# Patient Record
Sex: Female | Born: 1950
Health system: Southern US, Community
[De-identification: ages and names within clinical notes are randomized; demographics above are authoritative.]

## PROBLEM LIST (undated history)

## (undated) DIAGNOSIS — E669 Obesity, unspecified: Secondary | ICD-10-CM

## (undated) DIAGNOSIS — G43909 Migraine, unspecified, not intractable, without status migrainosus: Secondary | ICD-10-CM

## (undated) DIAGNOSIS — T4145XA Adverse effect of unspecified anesthetic, initial encounter: Secondary | ICD-10-CM

## (undated) DIAGNOSIS — T8859XA Other complications of anesthesia, initial encounter: Secondary | ICD-10-CM

## (undated) DIAGNOSIS — N200 Calculus of kidney: Secondary | ICD-10-CM

## (undated) DIAGNOSIS — Z9889 Other specified postprocedural states: Secondary | ICD-10-CM

## (undated) DIAGNOSIS — E559 Vitamin D deficiency, unspecified: Secondary | ICD-10-CM

## (undated) DIAGNOSIS — G5710 Meralgia paresthetica, unspecified lower limb: Secondary | ICD-10-CM

## (undated) DIAGNOSIS — L280 Lichen simplex chronicus: Secondary | ICD-10-CM

## (undated) DIAGNOSIS — R112 Nausea with vomiting, unspecified: Secondary | ICD-10-CM

## (undated) DIAGNOSIS — E7800 Pure hypercholesterolemia, unspecified: Secondary | ICD-10-CM

## (undated) DIAGNOSIS — M8580 Other specified disorders of bone density and structure, unspecified site: Secondary | ICD-10-CM

## (undated) DIAGNOSIS — M5030 Other cervical disc degeneration, unspecified cervical region: Secondary | ICD-10-CM

## (undated) HISTORY — DX: Meralgia paresthetica, unspecified lower limb: G57.10

## (undated) HISTORY — DX: Other cervical disc degeneration, unspecified cervical region: M50.30

## (undated) HISTORY — DX: Calculus of kidney: N20.0

## (undated) HISTORY — DX: Pure hypercholesterolemia, unspecified: E78.00

## (undated) HISTORY — DX: Other specified disorders of bone density and structure, unspecified site: M85.80

## (undated) HISTORY — DX: Vitamin D deficiency, unspecified: E55.9

## (undated) HISTORY — DX: Lichen simplex chronicus: L28.0

## (undated) HISTORY — PX: LITHOTRIPSY: SUR834

## (undated) HISTORY — PX: OTHER SURGICAL HISTORY: SHX169

---

## 1988-04-08 HISTORY — PX: OTHER SURGICAL HISTORY: SHX169

## 2004-03-28 ENCOUNTER — Ambulatory Visit: Payer: Self-pay | Admitting: Family Medicine

## 2005-08-28 ENCOUNTER — Ambulatory Visit: Payer: Self-pay | Admitting: Internal Medicine

## 2006-09-02 ENCOUNTER — Ambulatory Visit: Payer: Self-pay | Admitting: Internal Medicine

## 2007-09-30 ENCOUNTER — Ambulatory Visit: Payer: Self-pay | Admitting: Internal Medicine

## 2008-10-07 ENCOUNTER — Ambulatory Visit: Payer: Self-pay | Admitting: Internal Medicine

## 2008-10-13 ENCOUNTER — Ambulatory Visit: Payer: Self-pay | Admitting: Urology

## 2008-11-17 ENCOUNTER — Ambulatory Visit: Payer: Self-pay | Admitting: Urology

## 2009-10-19 ENCOUNTER — Ambulatory Visit: Payer: Self-pay | Admitting: Internal Medicine

## 2010-11-12 ENCOUNTER — Ambulatory Visit: Payer: Self-pay | Admitting: Unknown Physician Specialty

## 2011-11-13 ENCOUNTER — Ambulatory Visit: Payer: Self-pay | Admitting: Internal Medicine

## 2012-03-19 ENCOUNTER — Telehealth: Payer: Self-pay | Admitting: Internal Medicine

## 2012-03-19 NOTE — Telephone Encounter (Signed)
Pt called wanting to get a follow up appointment with you prior to the end of the year.  Pt stated she has met her deductable this year and wanted to see you Pt wants morning appointment

## 2012-03-19 NOTE — Telephone Encounter (Signed)
Schedule an appt for 12/18 - at 9:15 (30 min)

## 2012-03-19 NOTE — Telephone Encounter (Signed)
Pt aware of appointment 

## 2012-03-25 ENCOUNTER — Telehealth: Payer: Self-pay | Admitting: Internal Medicine

## 2012-03-25 ENCOUNTER — Ambulatory Visit (INDEPENDENT_AMBULATORY_CARE_PROVIDER_SITE_OTHER): Payer: No Typology Code available for payment source | Admitting: Internal Medicine

## 2012-03-25 ENCOUNTER — Encounter: Payer: Self-pay | Admitting: Internal Medicine

## 2012-03-25 VITALS — BP 120/80 | HR 71 | Temp 98.3°F | Ht 65.0 in | Wt 249.0 lb

## 2012-03-25 DIAGNOSIS — E559 Vitamin D deficiency, unspecified: Secondary | ICD-10-CM | POA: Insufficient documentation

## 2012-03-25 DIAGNOSIS — N200 Calculus of kidney: Secondary | ICD-10-CM | POA: Insufficient documentation

## 2012-03-25 DIAGNOSIS — M8580 Other specified disorders of bone density and structure, unspecified site: Secondary | ICD-10-CM

## 2012-03-25 DIAGNOSIS — Z139 Encounter for screening, unspecified: Secondary | ICD-10-CM

## 2012-03-25 DIAGNOSIS — E7800 Pure hypercholesterolemia, unspecified: Secondary | ICD-10-CM | POA: Insufficient documentation

## 2012-03-25 DIAGNOSIS — M899 Disorder of bone, unspecified: Secondary | ICD-10-CM

## 2012-03-25 NOTE — Telephone Encounter (Signed)
Pt would like dr scott to call her when she get her labs results back from [facility] employee health

## 2012-03-26 NOTE — Telephone Encounter (Signed)
Called patient to let her know

## 2012-04-03 ENCOUNTER — Encounter: Payer: Self-pay | Admitting: Internal Medicine

## 2012-04-03 NOTE — Assessment & Plan Note (Signed)
Is s/p lithotripsy.  Has been released by urology.  Follow.    

## 2012-04-03 NOTE — Assessment & Plan Note (Signed)
Low cholesterol diet and exercise.  Follow lipid panel.   

## 2012-04-03 NOTE — Assessment & Plan Note (Signed)
Bone density 04/05/10 revealed osteopenia.  Is vitamin D deficient.  Follow.  Check vitamin D level.

## 2012-04-03 NOTE — Progress Notes (Signed)
  Subjective:    [name], female    DOB: 23-Jun-1950, 61 y.o.   MRN: [id number]  HPI 61 year old female with past history of nephrolithiasis, hypercholesterolemia and C-spine and lumbar degenerative disc disease.  She comes in today for a scheduled follow up.  She states she has been doing relatively well.  She has noticed some increased hand stiffness and discomfort at the base of her left thumb.  She is walking - one mile per day.  Saw Dr Charlsie Merles for her feet.  Also followed by chiropractor.  Still some issues with her feet.  Has a pessary in now.   Has helped.  Sees GYN.  Saw Dr Janene Harvey.  Handling stress.  No cardiac symptoms with increased activity or exertion.   Past Medical History  Diagnosis Date  . Hypercholesterolemia   . Vitamin D deficiency   . Nephrolithiasis   . Degenerative disc disease, cervical     cervical  . Meralgia paraesthetica   . Neurodermatitis   . Osteopenia     Review of Systems Patient denies any headache, lightheadedness or dizziness.  No significant sinus or allergy symptoms.  No chest pain, tightness or palpitations.  No increased shortness of breath, cough or congestion.  No nausea or vomiting.  No abdominal pain or cramping.  No bowel change, such as diarrhea, constipation, BRBPR or melana.  No urine change.  Pessary helping.  Stiffness in her hands as outlined.        Objective:   Physical Exam  Filed Vitals:   03/25/12 0927  BP: 120/80  Pulse: 71  Temp: 98.3 F (51.78 C)   61 year old female in no acute distress.   HEENT:  Nares - clear.  OP- without lesions or erythema.  NECK:  Supple, nontender.  No audible bruit.   HEART:  Appears to be regular. LUNGS:  Without crackles or wheezing audible.  Respirations even and unlabored.   RADIAL PULSE:  Equal bilaterally.  ABDOMEN:  Soft, nontender.  No audible abdominal bruit.   EXTREMITIES:  No increased edema to be present.                   Assessment & Plan:  MSK.  Hand stiffness as  outlined.  Some pain at the base of the thumb.  Discussed further evaluation.  Discussed thumb splint.  She declines any further evaluation at this time.  Follow.    PREVIOUS ELEVATED BLOOD PRESSURE.  Not elevated today.  Follow.   CARDIOVASCULAR.  Stable.  Asympomatic.  Continue risk factor modification.    PULMONARY.  Breathing stable.  Follow.    GYN.  Sees Dr Janene Harvey.  Pessary in place. Helping.   HEALTH MAINTENANCE.  Physical 10/17/11.  GU and pelvic exams are done through GYN.  Colonoscopy 8/04 was normal.  Needs referral to GI for screening colonoscopy.  Due 2014.   Mammogram 11/12/10 - BiRADS I.  States up to date with mammogram this year.  Obtain results.

## 2012-04-03 NOTE — Assessment & Plan Note (Signed)
Replace.  Follow.

## 2012-06-01 ENCOUNTER — Telehealth: Payer: Self-pay | Admitting: Internal Medicine

## 2012-06-01 NOTE — Telephone Encounter (Signed)
Patient Information:  Caller Name: Kazaria  Phone: [phone]  Patient: Nicole Hardy, Nicole Hardy  Gender: Female  DOB: [birth year]  Age: 62 Years  PCP: Dale Warren  Office Follow Up:  Does the office need to follow up with this patient?: No  Instructions For The Office: N/A  RN Note:  All triage question negative; will use homecare and call back if sx worsen  Symptoms  Reason For Call & Symptoms: Pt is calling and states that she has a cold; sx started  05/30/12; sx include cough; post nasal drip;  she is scheduled for colonoscopy on 06/11/12  and instructed that she can not take certain meds due to the procedure;  was instructed by colonoscopy personel to call MD and see if she can get an antibiotic  "to knock it out; " cough is the main sx; denies diff breathing; non prod cough; no wheezing  Reviewed Health History In EMR: Yes  Reviewed Medications In EMR: Yes  Reviewed Allergies In EMR: Yes  Reviewed Surgeries / Procedures: Yes  Date of Onset of Symptoms: 05/30/2012  Guideline(s) Used:  Colds  Cough  Disposition Per Guideline:   Home Care  Reason For Disposition Reached:   Cough with no complications  Advice Given:  Cough Medicines:  OTC Cough Syrups: The most common cough suppressant in OTC cough medications is dextromethorphan. Often the letters "DM" appear in the name.  Cough Medicines:  OTC Cough Syrups: The most common cough suppressant in OTC cough medications is dextromethorphan. Often the letters "DM" appear in the name.  OTC Cough Drops: Cough drops can help a lot, especially for mild coughs. They reduce coughing by soothing your irritated throat and removing that tickle sensation in the back of the throat. Cough drops also have the advantage of portability - you can carry them with you.  Home Remedy - Hard Candy: Hard candy works just as well as medicine-flavored OTC cough drops. Diabetics should use sugar-free candy.  Home Remedy - Honey: This old home remedy has been shown  to help decrease coughing at night. The adult dosage is 2 teaspoons (10 ml) at bedtime. Honey should not be given to infants under one year of age.  Coughing Spasms:  Drink warm fluids. Inhale warm mist (Reason: both relax the airway and loosen up the phlegm).  Prevent Dehydration:  Drink adequate liquids.  Avoid Tobacco Smoke:  Smoking or being exposed to smoke makes coughs much worse.  Call Back If:  Difficulty breathing  Fever lasts > 3 days  You become worse.

## 2012-07-23 LAB — HM COLONOSCOPY

## 2012-09-16 ENCOUNTER — Telehealth: Payer: Self-pay | Admitting: Internal Medicine

## 2012-09-16 NOTE — Telephone Encounter (Signed)
Pt has cpx on 10/22/12 pt wanted to know if you want labs prior to this appointment Pt stated dr scott faxes order to  [facility] [phone]

## 2012-09-16 NOTE — Telephone Encounter (Signed)
Please advise which labs you would like to order

## 2012-09-17 NOTE — Telephone Encounter (Signed)
Needs 25 hydroxyvitamin D, lipid profile and met c.

## 2012-09-17 NOTE — Telephone Encounter (Signed)
Prescription for labs in your box.   

## 2012-09-17 NOTE — Telephone Encounter (Signed)
Lab order faxed & pt informed

## 2012-10-12 ENCOUNTER — Telehealth: Payer: Self-pay | Admitting: *Deleted

## 2012-10-12 NOTE — Telephone Encounter (Signed)
Faxed lab orders: 25 Hydroxyvitamin D (dx. Vit. D deficiency-268.9), fasting lipid profile (dx.272.0-hypercholesterolemia), Met C (hypercholesterolemia-272.0)

## 2012-10-22 ENCOUNTER — Encounter: Payer: Self-pay | Admitting: Internal Medicine

## 2012-10-22 ENCOUNTER — Ambulatory Visit (INDEPENDENT_AMBULATORY_CARE_PROVIDER_SITE_OTHER): Payer: No Typology Code available for payment source | Admitting: Internal Medicine

## 2012-10-22 VITALS — BP 104/70 | HR 74 | Temp 97.9°F | Ht 64.5 in | Wt 247.5 lb

## 2012-10-22 DIAGNOSIS — M8580 Other specified disorders of bone density and structure, unspecified site: Secondary | ICD-10-CM

## 2012-10-22 DIAGNOSIS — E559 Vitamin D deficiency, unspecified: Secondary | ICD-10-CM

## 2012-10-22 DIAGNOSIS — E7800 Pure hypercholesterolemia, unspecified: Secondary | ICD-10-CM

## 2012-10-22 DIAGNOSIS — N200 Calculus of kidney: Secondary | ICD-10-CM

## 2012-10-22 DIAGNOSIS — M949 Disorder of cartilage, unspecified: Secondary | ICD-10-CM

## 2012-10-22 DIAGNOSIS — M899 Disorder of bone, unspecified: Secondary | ICD-10-CM

## 2012-10-22 MED ORDER — ERGOCALCIFEROL 1.25 MG (50000 UT) PO CAPS: 50000.0000 [IU] | ORAL_CAPSULE | ORAL | Status: DC

## 2012-10-24 ENCOUNTER — Encounter: Payer: Self-pay | Admitting: Internal Medicine

## 2012-10-24 NOTE — Assessment & Plan Note (Signed)
Low cholesterol diet and exercise.  Follow lipid panel.  Recent lipid panel revealed total cholesterol 172, triglycerides 131, HDL 40 and LDL 106.

## 2012-10-24 NOTE — Assessment & Plan Note (Signed)
Start ergocalciferol 50,000 units q week.  Recheck vitamin D level in 3 months.

## 2012-10-24 NOTE — Assessment & Plan Note (Signed)
Bone density 04/05/10 revealed osteopenia.  Is vitamin D deficient.  Follow.  Recent vitamin D level low.  Start replacement as outlined.

## 2012-10-24 NOTE — Assessment & Plan Note (Signed)
Is s/p lithotripsy.  Has been released by urology.  Follow.    

## 2012-10-24 NOTE — Progress Notes (Signed)
  Subjective:    [name], female    DOB: Mar 27, 1951, 61 y.o.   MRN: [id number]  HPI 62 year old female with past history of nephrolithiasis, hypercholesterolemia and C-spine and lumbar degenerative disc disease.  She comes in today to follow up on these issues a well as for a complete physical exam.  She states she has been doing relatively well.   She is walking  Still some issues with her feet.  Has recently seen Dr Ether Griffins.  Has a pessary in now.   Has helped.  Sees GYN.  Seeing Dr Nicole Hardy.  Up to date with pelvic and pap smears.  Handling stress.  No cardiac symptoms with increased activity or exertion.  Not taking any vitamin D supplements.     Past Medical History  Diagnosis Date  . Hypercholesterolemia   . Vitamin D deficiency   . Nephrolithiasis   . Degenerative disc disease, cervical     cervical  . Meralgia paraesthetica   . Neurodermatitis   . Osteopenia     Review of Systems Patient denies any headache, lightheadedness or dizziness.  No significant sinus or allergy symptoms.  No chest pain, tightness or palpitations.  No increased shortness of breath, cough or congestion.  No nausea or vomiting.  No abdominal pain or cramping.  No bowel change, such as diarrhea, constipation, BRBPR or melana.  No urine change.  Pessary helping.  Persistent problems with her feet.  Has seen podiatry.  Desires no further intervention at this time.          Objective:   Physical Exam   Filed Vitals:   10/22/12 0816  BP: 104/70  Pulse: 74  Temp: 97.9 F (56.29 C)   62 year old female in no acute distress.   HEENT:  Nares- clear.  Oropharynx - without lesions. NECK:  Supple.  Nontender.  No audible bruit.  HEART:  Appears to be regular. LUNGS:  No crackles or wheezing audible.  Respirations even and unlabored.  RADIAL PULSE:  Equal bilaterally.    BREASTS:  No nipple discharge or nipple retraction present.  Could not appreciate any distinct nodules or axillary adenopathy.   ABDOMEN:  Soft, nontender.  Bowel sounds present and normal.  No audible abdominal bruit.  GU:  Performed through gyn.     EXTREMITIES:  No increased edema present.  DP pulses palpable and equal bilaterally.          Assessment & Plan:  MSK.  Persistent foot pain.  Seeing podiatry.  Desires no further intervention at this time.  Follow.   PREVIOUS ELEVATED BLOOD PRESSURE.  Not elevated today.  Follow.   CARDIOVASCULAR.  Stable.  Asympomatic.  Continue risk factor modification.    PULMONARY.  Breathing stable.  Follow.    GYN.  Sees Dr Nicole Hardy.  Pessary in place. Helping.   HEALTH MAINTENANCE.  Physical today.  GU and pelvic exams are done through GYN.  Colonoscopy 8/04 was normal.  Needs referral to GI for screening colonoscopy.  Due 2014.   Schedule mammogram.

## 2012-10-29 ENCOUNTER — Other Ambulatory Visit: Payer: Self-pay | Admitting: *Deleted

## 2012-11-18 ENCOUNTER — Ambulatory Visit: Payer: Self-pay | Admitting: Internal Medicine

## 2012-11-26 ENCOUNTER — Encounter: Payer: Self-pay | Admitting: Internal Medicine

## 2012-12-25 ENCOUNTER — Encounter: Payer: Self-pay | Admitting: Adult Health

## 2012-12-25 ENCOUNTER — Ambulatory Visit (INDEPENDENT_AMBULATORY_CARE_PROVIDER_SITE_OTHER): Payer: No Typology Code available for payment source | Admitting: Adult Health

## 2012-12-25 VITALS — BP 128/84 | HR 82 | Temp 98.1°F | Resp 12 | Ht 64.5 in | Wt 249.0 lb

## 2012-12-25 DIAGNOSIS — B370 Candidal stomatitis: Secondary | ICD-10-CM

## 2012-12-25 MED ORDER — NYSTATIN 100000 UNIT/ML MT SUSP: 500000.0000 [IU] | Freq: Four times a day (QID) | OROMUCOSAL | Status: DC

## 2012-12-25 NOTE — Assessment & Plan Note (Signed)
Nystatin swish and swallow. RTC if symptoms not improved within 4-5 days.

## 2012-12-25 NOTE — Patient Instructions (Addendum)
  Start Nystatin swish and swallow 4 times a day until completed.  Please let us know if your symptoms are not improved within 4-5 days.  Try [place] yogurt which helps with normal bacteria and maintaining a balance.

## 2012-12-25 NOTE — Progress Notes (Signed)
  Subjective:    [name], female    DOB: [birth year], 62 y.o.   MRN: [id number]  HPI  Patient presents to clinic with concerns of discoloration of her tongue. She was recently started on an antibiotic and developed the symptoms shortly after.  Current Outpatient Prescriptions on File Prior to Visit  Medication Sig Dispense Refill  . ergocalciferol (VITAMIN D2) 50000 UNITS capsule Take 1 capsule (50,000 Units total) by mouth once a week.  4 capsule  3   No current facility-administered medications on file prior to visit.    Review of Systems  Constitutional: Negative.   HENT:       Yellow coating on tongue after finishing antibiotic    BP 128/84  Pulse 82  Temp(Src) 98.1 F (36.7 C) (Oral)  Resp 12  Ht 5' 4.5" (1.638 m)  Wt 249 lb (112.946 kg)  BMI 42.1 kg/m2  SpO2 98%  LMP 03/26/2003     Objective:   Physical Exam  HENT:  Thrush after completing antibiotic      Assessment & Plan:

## 2012-12-29 ENCOUNTER — Telehealth: Payer: Self-pay | Admitting: Adult Health

## 2012-12-29 NOTE — Telephone Encounter (Signed)
LVM for patient to call our office  

## 2012-12-29 NOTE — Telephone Encounter (Signed)
Try gently brushing your tongue with a solution that is 1 part hydrogen peroxide and 5 parts water. Rinse your mouth with water afterward. Increasing fiber in your diet also may help by decreasing the bacteria in your mouth that cause yellow tongue. Also recommend a probiotic and/or yogurt.

## 2012-12-29 NOTE — Telephone Encounter (Signed)
Patient was told to call back to let you know how she is doing with the medication for thrush. She states it has improved but she still has a yellow discoloration on her tongue. Wondering if this will continue to improve or does she need another round of meds?

## 2013-01-01 NOTE — Telephone Encounter (Signed)
Pt aware.

## 2013-01-11 ENCOUNTER — Telehealth: Payer: Self-pay | Admitting: Internal Medicine

## 2013-01-11 NOTE — Telephone Encounter (Signed)
Pt state she was seen by Raquel for mouth thrush.  States she was given nystatin and also told to use 1 part peroxide to 5 parts water, as well as eat yogurt and increase fiber in diet.  Pt states she has done all of these things and still has problems.  Says she started nystatin on 9/19, started peroxide treatments twice a day on 9/24.  States after first round of nystatin she though it was improving but now is about just as bad as it was in the beginning.  Please let her know next step.

## 2013-01-11 NOTE — Telephone Encounter (Signed)
Pt states that she can not come at that time, needs another day & needs a morning appt.

## 2013-01-11 NOTE — Telephone Encounter (Signed)
I can see her at 11:45 on 01/14/13.  This week this is the only am appt I have.  Please call her and see if she can come in then.  Thanks.

## 2013-01-11 NOTE — Telephone Encounter (Signed)
If persistent symptoms, I can see her at 2:00 tomorrow.  Work in for this.

## 2013-01-13 NOTE — Telephone Encounter (Signed)
Appointment made pt aware

## 2013-01-14 ENCOUNTER — Ambulatory Visit (INDEPENDENT_AMBULATORY_CARE_PROVIDER_SITE_OTHER): Payer: No Typology Code available for payment source | Admitting: Internal Medicine

## 2013-01-14 ENCOUNTER — Encounter: Payer: Self-pay | Admitting: Internal Medicine

## 2013-01-14 VITALS — BP 110/70 | HR 72 | Temp 97.9°F | Ht 64.5 in | Wt 252.2 lb

## 2013-01-14 DIAGNOSIS — E7800 Pure hypercholesterolemia, unspecified: Secondary | ICD-10-CM

## 2013-01-14 DIAGNOSIS — E559 Vitamin D deficiency, unspecified: Secondary | ICD-10-CM

## 2013-01-14 DIAGNOSIS — B370 Candidal stomatitis: Secondary | ICD-10-CM

## 2013-01-17 ENCOUNTER — Encounter: Payer: Self-pay | Admitting: Internal Medicine

## 2013-01-17 NOTE — Assessment & Plan Note (Signed)
Low cholesterol diet and exercise.  Follow lipid panel.  Recent lipid panel revealed total cholesterol 172, triglycerides 131, HDL 40 and LDL 106.

## 2013-01-17 NOTE — Progress Notes (Signed)
  Subjective:    [name], female    DOB: Dec 11, 1950, 62 y.o.   MRN: [id number]  HPI 62 year old female with past history of nephrolithiasis, hypercholesterolemia and C-spine and lumbar degenerative disc disease.  She comes in today as a work in with concerns regarding some persistent thrush.  She states she had been doing relatively well.   Was placed on an abx on 12/11/12.  Two days after completing the abx, she developed a coating on her tongue.  Saw Raquel.  Was given a few days of nystatin suspension.  Reports improvement, but did not completely resolve.  She has been eating yogurt.  Rinsing with peroxide and water and using listarene rinses.  She has also been gargling with salt water.  Better, but still with persistent coating on her tongue.     Past Medical History  Diagnosis Date  . Hypercholesterolemia   . Vitamin D deficiency   . Nephrolithiasis   . Degenerative disc disease, cervical     cervical  . Meralgia paraesthetica   . Neurodermatitis   . Osteopenia     Current Outpatient Prescriptions on File Prior to Visit  Medication Sig Dispense Refill  . ergocalciferol (VITAMIN D2) 50000 UNITS capsule Take 1 capsule (50,000 Units total) by mouth once a week.  4 capsule  3   No current facility-administered medications on file prior to visit.    Review of Systems Patient denies any headache, lightheadedness or dizziness.  No significant sinus or allergy symptoms.  No chest pain, tightness or palpitations.  No increased shortness of breath, cough or congestion.  No nausea or vomiting.  No acid reflux.              Objective:   Physical Exam   Filed Vitals:   01/14/13 1152  BP: 110/70  Pulse: 72  Temp: 97.9 F (60.19 C)   63 year old female in no acute distress.   HEENT:  Nares- clear.  Oropharynx - some white coating posterior tongue.  No other lesions noted.  NECK:  Supple.  Nontender.  No audible bruit.  HEART:  Appears to be regular. LUNGS:  No crackles  or wheezing audible.  Respirations even and unlabored.  ABDOMEN:  Soft, nontender.  Bowel sounds present and normal.  No audible abdominal bruit.       Assessment & Plan:  MSK.  Persistent foot pain.  Seeing podiatry.  Desires no further intervention at this time.  Follow.   PREVIOUS ELEVATED BLOOD PRESSURE.  Not elevated today.  Follow.   CARDIOVASCULAR.  Stable.  Asympomatic.  Continue risk factor modification.    PULMONARY.  Breathing stable.  Follow.    GYN.  Sees Dr Janene Harvey.  Pessary in place. Helping.   HEALTH MAINTENANCE.  Physical last visit.   GU and pelvic exams are done through GYN.  Colonoscopy 8/04 was normal.  Needs referral to GI for screening colonoscopy.  Due 2014.   Mammogram 11/18/12 - Birads II.

## 2013-01-17 NOTE — Assessment & Plan Note (Signed)
Persistent.  Only used the nystatin for a few days.  Refilled nystatin suspension.   Call if persistent.

## 2013-01-17 NOTE — Assessment & Plan Note (Signed)
On ergocalciferol 50,000 units q week.  Recheck vitamin D level.

## 2013-02-15 ENCOUNTER — Other Ambulatory Visit: Payer: Self-pay | Admitting: Internal Medicine

## 2013-03-19 ENCOUNTER — Other Ambulatory Visit: Payer: Self-pay | Admitting: Internal Medicine

## 2013-04-15 ENCOUNTER — Telehealth: Payer: Self-pay | Admitting: Internal Medicine

## 2013-04-15 ENCOUNTER — Ambulatory Visit: Payer: Self-pay | Admitting: General Practice

## 2013-04-15 ENCOUNTER — Telehealth: Payer: Self-pay | Admitting: Emergency Medicine

## 2013-04-15 NOTE — Telephone Encounter (Signed)
Pt LVM asking to be referred to [facility] due to some issues with her leg. Please advise

## 2013-04-15 NOTE — Telephone Encounter (Signed)
error 

## 2013-04-15 NOTE — Telephone Encounter (Signed)
Left voicemail on pt home phone to let her know that she can discuss her leg issues & talk about referral at her upcoming appt on 04/19/13 with Dr. Lorin PicketScott

## 2013-04-19 ENCOUNTER — Ambulatory Visit (INDEPENDENT_AMBULATORY_CARE_PROVIDER_SITE_OTHER): Payer: No Typology Code available for payment source | Admitting: Internal Medicine

## 2013-04-19 ENCOUNTER — Encounter: Payer: Self-pay | Admitting: Internal Medicine

## 2013-04-19 ENCOUNTER — Encounter (INDEPENDENT_AMBULATORY_CARE_PROVIDER_SITE_OTHER): Payer: Self-pay

## 2013-04-19 VITALS — BP 110/70 | HR 80 | Temp 98.2°F | Ht 64.5 in | Wt 250.2 lb

## 2013-04-19 DIAGNOSIS — M949 Disorder of cartilage, unspecified: Secondary | ICD-10-CM

## 2013-04-19 DIAGNOSIS — M8580 Other specified disorders of bone density and structure, unspecified site: Secondary | ICD-10-CM

## 2013-04-19 DIAGNOSIS — E7800 Pure hypercholesterolemia, unspecified: Secondary | ICD-10-CM

## 2013-04-19 DIAGNOSIS — M899 Disorder of bone, unspecified: Secondary | ICD-10-CM

## 2013-04-19 DIAGNOSIS — N200 Calculus of kidney: Secondary | ICD-10-CM

## 2013-04-19 DIAGNOSIS — E559 Vitamin D deficiency, unspecified: Secondary | ICD-10-CM

## 2013-04-19 DIAGNOSIS — I809 Phlebitis and thrombophlebitis of unspecified site: Secondary | ICD-10-CM

## 2013-04-19 MED ORDER — CLOTRIMAZOLE-BETAMETHASONE 1-0.05 % EX CREA: 1.0000 "application " | TOPICAL_CREAM | Freq: Two times a day (BID) | CUTANEOUS | Status: DC

## 2013-04-19 NOTE — Assessment & Plan Note (Addendum)
On vitamin D2 1.25.   Recheck vitamin D level.

## 2013-04-19 NOTE — Assessment & Plan Note (Addendum)
Bone density 04/05/10 revealed osteopenia.  Is vitamin D deficient.  Follow.  Recent vitamin D level low.  Continue vitamin D supplements.  Recheck vitamin D level.

## 2013-04-19 NOTE — Progress Notes (Signed)
  Subjective:    [name], female    DOB: 08/19/1950, 63 y.o.   MRN: [id number]  HPI 63 year old female with past history of nephrolithiasis, hypercholesterolemia and C-spine and lumbar degenerative disc disease.  She comes in today for a scheduled follow up.  She started having left leg pain over one week ago.  Went to acute care.  Was diagnosed with superficial thrombophlebitis.  Saw [facility] for leg swelling and pain.  Had ultrasound.  Negative DVT.  Was instructed to take Alleve and Mobic.  Has follow up in two weeks.  She also describes some left hip pain.  Has a pessary in place (due to prolapse).  [facility].  Breathing stable.  No nausea or vomiting.  No bowel change.     Past Medical History  Diagnosis Date  . Hypercholesterolemia   . Vitamin D deficiency   . Nephrolithiasis   . Degenerative disc disease, cervical     cervical  . Meralgia paraesthetica   . Neurodermatitis   . Osteopenia     Current Outpatient Prescriptions on File Prior to Visit  Medication Sig Dispense Refill  . Vitamin D, Ergocalciferol, (DRISDOL) 50000 UNITS CAPS capsule TAKE ONE CAPSULE BY MOUTH ONCE A WEEK  4 capsule  3   No current facility-administered medications on file prior to visit.    Review of Systems Patient denies any headache, lightheadedness or dizziness.  No significant sinus or allergy symptoms.  No chest pain, tightness or palpitations.  No increased shortness of breath, cough or congestion.  No nausea or vomiting.  No acid reflux.  No abdominal pain or cramping.  Pessary in place.  Leg and hip pain as outlined.  Pessary in place.  Followed by gyn.              Objective:   Physical Exam  Filed Vitals:   04/19/13 0855  BP: 110/70  Pulse: 80  Temp: 98.2 F (3636.378 C)   63 year old female in no acute distress.   HEENT:  Nares- clear.  Oropharynx - without lesions. NECK:  Supple.  Nontender.  No audible bruit.  HEART:  Appears to be regular. LUNGS:  No  crackles or wheezing audible.  Respirations even and unlabored.  RADIAL PULSE:  Equal bilaterally.    ABDOMEN:  Soft, nontender.  Bowel sounds present and normal.  No audible abdominal bruit.   EXTREMITIES:  No increased edema present.  DP pulses palpable and equal bilaterally.          Assessment & Plan:  MSK.  Following with vascular surgery for his leg.  Stretches.  Follow hip.    PREVIOUS ELEVATED BLOOD PRESSURE.  Not elevated today.  Follow.   CARDIOVASCULAR.  Stable.  Asympomatic.  Continue risk factor modification.    PULMONARY.  Breathing stable.  Follow.    GYN.  Sees Dr Janene HarveyKlett.  Pessary in place. Helping.  [facility].    HEALTH MAINTENANCE.  Physical 10/22/12.   GU and pelvic exams are done through GYN.  Colonoscopy 8/04 was normal.  Needs referral to GI for screening colonoscopy.  Was due 2014.   Needs referral.  Mammogram 11/18/12 - Birads II.

## 2013-04-19 NOTE — Progress Notes (Signed)
Pre-visit discussion using our clinic review tool. No additional management support is needed unless otherwise documented below in the visit note.  

## 2013-04-19 NOTE — Assessment & Plan Note (Addendum)
Low cholesterol diet and exercise.  Follow lipid panel.  Last lipid panel revealed total cholesterol 172, triglycerides 131, HDL 40 and LDL 106.  Follow.

## 2013-04-19 NOTE — Assessment & Plan Note (Addendum)
Is s/p lithotripsy.  Has been released by urology.  Follow.

## 2013-04-20 ENCOUNTER — Encounter: Payer: Self-pay | Admitting: Internal Medicine

## 2013-04-21 ENCOUNTER — Encounter: Payer: Self-pay | Admitting: Internal Medicine

## 2013-04-21 DIAGNOSIS — I809 Phlebitis and thrombophlebitis of unspecified site: Secondary | ICD-10-CM | POA: Insufficient documentation

## 2013-04-21 NOTE — Assessment & Plan Note (Signed)
Recently diagnosed with thrombophlebitis.  Being followed by [facility].

## 2013-08-17 ENCOUNTER — Encounter (INDEPENDENT_AMBULATORY_CARE_PROVIDER_SITE_OTHER): Payer: Self-pay

## 2013-08-17 ENCOUNTER — Telehealth: Payer: Self-pay | Admitting: Internal Medicine

## 2013-08-17 ENCOUNTER — Ambulatory Visit (INDEPENDENT_AMBULATORY_CARE_PROVIDER_SITE_OTHER): Payer: No Typology Code available for payment source | Admitting: Internal Medicine

## 2013-08-17 ENCOUNTER — Ambulatory Visit: Payer: Self-pay | Admitting: Internal Medicine

## 2013-08-17 ENCOUNTER — Encounter: Payer: Self-pay | Admitting: Internal Medicine

## 2013-08-17 VITALS — BP 118/84 | HR 87 | Temp 98.2°F | Wt 248.8 lb

## 2013-08-17 DIAGNOSIS — J209 Acute bronchitis, unspecified: Secondary | ICD-10-CM

## 2013-08-17 MED ORDER — HYDROCOD POLST-CHLORPHEN POLST 10-8 MG/5ML PO LQCR: 5.0000 mL | Freq: Two times a day (BID) | ORAL | Status: DC | PRN

## 2013-08-17 MED ORDER — AMOXICILLIN-POT CLAVULANATE 875-125 MG PO TABS: 1.0000 | ORAL_TABLET | Freq: Two times a day (BID) | ORAL | Status: DC

## 2013-08-17 NOTE — Telephone Encounter (Signed)
CXR showed no acute process. I would recommend that pt continue the Augmentin, as prescribed and follow up for recheck in 1 week.

## 2013-08-17 NOTE — Assessment & Plan Note (Signed)
Symptoms and exam most consistent with bronchitis, likely triggered by allergic rhinitis. However, given some focal rhonchi on left lower lung exam and persistence of symptoms >3 months, will get CXR today. Will start Augmentin. Use Tussionex prn cough. Continue Claritin. Follow up for recheck next week.

## 2013-08-17 NOTE — Progress Notes (Signed)
Subjective:    [name], female    DOB: 09/14/1950, 63 y.o.   MRN: [id number]  HPI 63YO female presents for acute visit with sore throat.  Initially developed cough and hoarseness in mid March. Was seen at employee clinic in April and treated with Claritin. Had some cough off and on since that time. Was generally doing well until this Sunday, developed sore throat and swollen glands in neck. Also had cough with thick green mucous. Last night had some cough with clear sputum. Fever 71F. Took Tylenol with some improvement. No dyspnea or chest pain. Throat occasionally feels sore. No trouble swallowing.  Review of Systems  Constitutional: Positive for fever and fatigue. Negative for chills, appetite change and unexpected weight change.  HENT: Positive for congestion, sore throat and voice change. Negative for ear pain, sinus pressure and trouble swallowing.   Eyes: Negative for visual disturbance.  Respiratory: Positive for cough. Negative for shortness of breath, wheezing and stridor.   Cardiovascular: Negative for chest pain, palpitations and leg swelling.  Gastrointestinal: Negative for nausea, vomiting, abdominal pain, diarrhea, constipation, blood in stool, abdominal distention and anal bleeding.  Genitourinary: Negative for dysuria and flank pain.  Musculoskeletal: Negative for arthralgias, gait problem, myalgias and neck pain.  Skin: Negative for color change and rash.  Neurological: Negative for dizziness and headaches.  Hematological: Negative for adenopathy. Does not bruise/bleed easily.  Psychiatric/Behavioral: Negative for suicidal ideas, sleep disturbance and dysphoric mood. The patient is not nervous/anxious.        Objective:    BP 118/84  Pulse 87  Temp(Src) 98.2 F (36.8 C) (Oral)  Wt 248 lb 12 oz (112.832 kg)  SpO2 96%  LMP 03/26/2003 Physical Exam  Constitutional: She is oriented to person, place, and time. She appears well-developed and  well-nourished. No distress.  HENT:  Head: Normocephalic and atraumatic.  Right Ear: External ear normal.  Left Ear: External ear normal.  Nose: Nose normal.  Mouth/Throat: Oropharynx is clear and moist. No oropharyngeal exudate.  Eyes: Conjunctivae are normal. Pupils are equal, round, and reactive to light. Right eye exhibits no discharge. Left eye exhibits no discharge. No scleral icterus.  Neck: Normal range of motion. Neck supple. No tracheal deviation present. No thyromegaly present.  Cardiovascular: Normal rate, regular rhythm, normal heart sounds and intact distal pulses.  Exam reveals no gallop and no friction rub.   No murmur heard. Pulmonary/Chest: Effort normal. No accessory muscle usage. Not tachypneic. No respiratory distress. She has no decreased breath sounds. She has no wheezes. She has rhonchi in the left lower field. She has no rales. She exhibits no tenderness.  Musculoskeletal: Normal range of motion. She exhibits no edema and no tenderness.  Lymphadenopathy:    She has no cervical adenopathy.  Neurological: She is alert and oriented to person, place, and time. No cranial nerve deficit. She exhibits normal muscle tone. Coordination normal.  Skin: Skin is warm and dry. No rash noted. She is not diaphoretic. No erythema. No pallor.  Psychiatric: She has a normal mood and affect. Her behavior is normal. Judgment and thought content normal.          Assessment & Plan:   Problem List Items Addressed This Visit   Acute bronchitis - Primary     Symptoms and exam most consistent with bronchitis, likely triggered by allergic rhinitis. However, given some focal rhonchi on left lower lung exam and persistence of symptoms >3 months, will get CXR today. Will start Augmentin.  Use Tussionex prn cough. Continue Claritin. Follow up for recheck next week.    Relevant Medications      AMOXICILLIN-POT CLAVULANATE 875-125 MG PO TABS      chlorpheniramine-HYDROcodone (TUSSIONEX)  suspension 8-10 mg/645mL   Other Relevant Orders      DG Chest 2 View       Return in about 1 week (around 08/24/2013) for Recheck.

## 2013-08-17 NOTE — Patient Instructions (Addendum)
[facility] today [facility].  Start Augmentin to treat infection and start Tussionex as needed for cough at night.  Follow up next week.

## 2013-08-17 NOTE — Telephone Encounter (Signed)
Left message, notifying of results and need to continue Augmentin and follow up in one week.

## 2013-08-17 NOTE — Progress Notes (Signed)
Pre visit review using our clinic review tool, if applicable. No additional management support is needed unless otherwise documented below in the visit note. 

## 2013-08-27 ENCOUNTER — Ambulatory Visit (INDEPENDENT_AMBULATORY_CARE_PROVIDER_SITE_OTHER): Payer: No Typology Code available for payment source | Admitting: Internal Medicine

## 2013-08-27 ENCOUNTER — Encounter: Payer: Self-pay | Admitting: Internal Medicine

## 2013-08-27 VITALS — BP 110/70 | HR 71 | Temp 97.9°F | Ht 64.5 in | Wt 250.5 lb

## 2013-08-27 DIAGNOSIS — N899 Noninflammatory disorder of vagina, unspecified: Secondary | ICD-10-CM

## 2013-08-27 DIAGNOSIS — J209 Acute bronchitis, unspecified: Secondary | ICD-10-CM

## 2013-08-27 DIAGNOSIS — N898 Other specified noninflammatory disorders of vagina: Secondary | ICD-10-CM

## 2013-08-27 MED ORDER — NYSTATIN 100000 UNIT/GM EX CREA: 1.0000 "application " | TOPICAL_CREAM | Freq: Two times a day (BID) | CUTANEOUS | Status: DC

## 2013-08-27 NOTE — Progress Notes (Signed)
Pre visit review using our clinic review tool, if applicable. No additional management support is needed unless otherwise documented below in the visit note. 

## 2013-08-27 NOTE — Patient Instructions (Signed)
Monistat - apply to vagina twice a day.

## 2013-08-30 ENCOUNTER — Encounter: Payer: Self-pay | Admitting: Internal Medicine

## 2013-08-30 NOTE — Assessment & Plan Note (Signed)
No increased cough or congestion now.  Breathing stable.  Feels better.  Follow.

## 2013-08-30 NOTE — Progress Notes (Signed)
  Subjective:    [name], female    DOB: [birth year], 63 y.o.   MRN: [id number]  HPI 63 year old female with past history of nephrolithiasis, hypercholesterolemia and C-spine and lumbar degenerative disc disease.  She comes in today for a scheduled follow up.  She was seen last week by Dr Dan Humphreys.  Diagnosed with bronchitis.  Treated with augmentin.  States she feels better.  No cough or congestion.   Breathing stable.  No sob.   No nausea or vomiting.  No bowel change.  She does report some vaginal irritation.  We discussed using nystatin cream or monistat.  Describes the irritation - external.  No vaginal discharge.    Past Medical History  Diagnosis Date  . Hypercholesterolemia   . Vitamin D deficiency   . Nephrolithiasis   . Degenerative disc disease, cervical     cervical  . Meralgia paraesthetica   . Neurodermatitis   . Osteopenia     Current Outpatient Prescriptions on File Prior to Visit  Medication Sig Dispense Refill  . amoxicillin-clavulanate (AUGMENTIN) 875-125 MG per tablet Take 1 tablet by mouth 2 (two) times daily.  20 tablet  0  . loratadine (CLARITIN) 10 MG tablet Take 10 mg by mouth daily.      . Vitamin D, Ergocalciferol, (DRISDOL) 50000 UNITS CAPS capsule TAKE ONE CAPSULE BY MOUTH ONCE A WEEK  4 capsule  3   No current facility-administered medications on file prior to visit.    Review of Systems Patient denies any headache, lightheadedness or dizziness.  No significant sinus or allergy symptoms.  No chest pain, tightness or palpitations.  No increased shortness of breath, cough or congestion.  No nausea or vomiting.  No acid reflux.  No abdominal pain or cramping.  Some external vaginal irritation.  No vaginal discharge.               Objective:   Physical Exam  Filed Vitals:   08/27/13 1052  BP: 110/70  Pulse: 71  Temp: 97.9 F (56.78 C)   63 year old female in no acute distress.   HEENT:  Nares- clear.  Oropharynx - without lesions. NECK:   Supple.  Nontender.  No audible bruit.  HEART:  Appears to be regular. LUNGS:  No crackles or wheezing audible.  Respirations even and unlabored.  RADIAL PULSE:  Equal bilaterally.    ABDOMEN:  Soft, nontender.  Bowel sounds present and normal.  No audible abdominal bruit.   EXTREMITIES:  No increased edema present.  DP pulses palpable and equal bilaterally.          Assessment & Plan:  PREVIOUS ELEVATED BLOOD PRESSURE.  Not elevated today.  Follow.   CARDIOVASCULAR.  Stable.  Asympomatic.  Continue risk factor modification.    PULMONARY.  Breathing stable.  Follow.    GYN.  Sees Dr Janene Harvey.  Pessary in place. Helping.  Referred to [facility].    HEALTH MAINTENANCE.  Physical 10/22/12.   GU and pelvic exams are done through GYN.  Colonoscopy 8/04 was normal.  Needs referral to GI for screening colonoscopy.  Was due 2014.   Needs referral.  Mammogram 11/18/12 - Birads II.

## 2013-08-30 NOTE — Assessment & Plan Note (Signed)
External irritation.   Can use monistat or nystatin as directed.  Follow.

## 2013-09-03 ENCOUNTER — Encounter: Payer: Self-pay | Admitting: Internal Medicine

## 2013-09-22 ENCOUNTER — Other Ambulatory Visit: Payer: Self-pay | Admitting: Internal Medicine

## 2013-09-30 ENCOUNTER — Telehealth: Payer: Self-pay | Admitting: *Deleted

## 2013-09-30 NOTE — Telephone Encounter (Signed)
Prescription for labs in your box.

## 2013-09-30 NOTE — Telephone Encounter (Signed)
[facility]

## 2013-09-30 NOTE — Telephone Encounter (Signed)
Pt called [facility] to her CPE appoint with you.  Please advise

## 2013-10-21 ENCOUNTER — Other Ambulatory Visit: Payer: Self-pay | Admitting: Internal Medicine

## 2013-10-21 LAB — HEPATIC FUNCTION PANEL
ALT: 12 U/L (ref 7–35)
AST: 15 U/L (ref 13–35)
Alkaline Phosphatase: 73 U/L (ref 25–125)
Bilirubin, Total: 0.4 mg/dL

## 2013-10-21 LAB — CBC AND DIFFERENTIAL
HCT: 40 % (ref 36–46)
Hemoglobin: 13 g/dL (ref 12.0–16.0)
Neutrophils Absolute: 3 /uL
Platelets: 334 10*3/uL (ref 150–399)
WBC: 5.9 10^3/mL

## 2013-10-21 LAB — BASIC METABOLIC PANEL
BUN: 11 mg/dL (ref 4–21)
Creatinine: 0.8 mg/dL (ref 0.5–1.1)
Glucose: 96 mg/dL

## 2013-10-21 LAB — TSH: TSH: 2.77 u[IU]/mL (ref 0.41–5.90)

## 2013-10-21 LAB — LIPID PANEL
Cholesterol: 201 mg/dL — AB (ref 0–200)
HDL: 45 mg/dL (ref 35–70)
LDL Cholesterol: 142 mg/dL
LDl/HDL Ratio: 4.5
Triglycerides: 71 mg/dL (ref 40–160)

## 2013-10-25 ENCOUNTER — Ambulatory Visit (INDEPENDENT_AMBULATORY_CARE_PROVIDER_SITE_OTHER): Payer: No Typology Code available for payment source | Admitting: Internal Medicine

## 2013-10-25 ENCOUNTER — Encounter: Payer: Self-pay | Admitting: Internal Medicine

## 2013-10-25 VITALS — BP 120/82 | HR 81 | Temp 98.2°F | Ht 65.0 in | Wt 251.0 lb

## 2013-10-25 DIAGNOSIS — I809 Phlebitis and thrombophlebitis of unspecified site: Secondary | ICD-10-CM

## 2013-10-25 DIAGNOSIS — Z0000 Encounter for general adult medical examination without abnormal findings: Secondary | ICD-10-CM

## 2013-10-25 DIAGNOSIS — E7800 Pure hypercholesterolemia, unspecified: Secondary | ICD-10-CM

## 2013-10-25 DIAGNOSIS — E559 Vitamin D deficiency, unspecified: Secondary | ICD-10-CM

## 2013-10-25 DIAGNOSIS — M8580 Other specified disorders of bone density and structure, unspecified site: Secondary | ICD-10-CM

## 2013-10-25 DIAGNOSIS — N200 Calculus of kidney: Secondary | ICD-10-CM

## 2013-10-25 DIAGNOSIS — Z1239 Encounter for other screening for malignant neoplasm of breast: Secondary | ICD-10-CM

## 2013-10-25 MED ORDER — VITAMIN D (ERGOCALCIFEROL) 1.25 MG (50000 UNIT) PO CAPS: ORAL_CAPSULE | ORAL | Status: DC

## 2013-10-25 NOTE — Progress Notes (Signed)
Subjective:    [name], female    DOB: 09/21/1950, 63 y.o.   MRN: [id number]  HPI 63 year old female with past history of nephrolithiasis, hypercholesterolemia and C-spine and lumbar degenerative disc disease.  She comes in today to follow up on these issues as well as for a complete physical exam.  States overall she feels she is doing well.  No cough or congestion.   Breathing stable.  No sob.   No nausea or vomiting.  No bowel change.   She has some back discomfort at times.  Not severe.  Occurs if she is still for a period of time.  Better if she gets up and moves around.  Still has issues with her feet.  Limits her weight bearing.  Unable to exercise.  We discussed water aerobics.  Again, overall she feels she is doing well.     Past Medical History  Diagnosis Date  . Hypercholesterolemia   . Vitamin D deficiency   . Nephrolithiasis   . Degenerative disc disease, cervical     cervical  . Meralgia paraesthetica   . Neurodermatitis   . Osteopenia     Current Outpatient Prescriptions on File Prior to Visit  Medication Sig Dispense Refill  . nystatin cream (MYCOSTATIN) Apply 1 application topically 2 (two) times daily.  30 g  1  . Vitamin D, Ergocalciferol, (DRISDOL) 50000 UNITS CAPS capsule TAKE ONE CAPSULE BY MOUTH ONCE A WEEK  4 capsule  3   No current facility-administered medications on file prior to visit.    Review of Systems Patient denies any headache, lightheadedness or dizziness.  No significant sinus or allergy symptoms.  No chest pain, tightness or palpitations.  No increased shortness of breath, cough or congestion.  No nausea or vomiting.  No acid reflux.  No abdominal pain or cramping.  Some back discomfort as outlined.  Minimal.  Better with movement.  No radicular symptoms.  Taking her vitamin D regularly - once a week.  Handling stress well.  Taking care of her mother.               Objective:   Physical Exam  Filed Vitals:   10/25/13 0832  BP:  120/82  Pulse: 81  Temp: 98.2 F (36.8 C)   Blood pressure recheck:  46128/5180  63 year old female in no acute distress.   HEENT:  Nares- clear.  Oropharynx - without lesions. NECK:  Supple.  Nontender.  No audible bruit.  HEART:  Appears to be regular. LUNGS:  No crackles or wheezing audible.  Respirations even and unlabored.  RADIAL PULSE:  Equal bilaterally.    BREASTS:  No nipple discharge or nipple retraction present.  Could not appreciate any distinct nodules or axillary adenopathy.  ABDOMEN:  Soft, nontender.  Bowel sounds present and normal.  No audible abdominal bruit.  GU:  Performed by gyn.     EXTREMITIES:  No increased edema present.  DP pulses palpable and equal bilaterally.          Assessment & Plan:  PREVIOUS ELEVATED BLOOD PRESSURE.  Not elevated today.  Follow.  Outside checks have been under good control.    CARDIOVASCULAR.  Stable.  Asympomatic.  Continue risk factor modification.    PULMONARY.  Breathing stable.  Follow.    GYN.  Sees Dr Janene HarveyKlett.  Pessary in place. Helping.  [facility].  Physical today.   GU and pelvic exams are  done through GYN.  Colonoscopy 8/04 was normal.  Mammogram 11/18/12 - Birads II.  Had colonoscopy by Dr Niel Hummer.  Need results.  She signed release.  States up to date.    I spent 25 minutes with the patient and more than 50% of the time was spent in consultation regarding the above.

## 2013-10-25 NOTE — Progress Notes (Signed)
Pre visit review using our clinic review tool, if applicable. No additional management support is needed unless otherwise documented below in the visit note. 

## 2013-10-26 ENCOUNTER — Encounter: Payer: Self-pay | Admitting: Internal Medicine

## 2013-10-26 NOTE — Assessment & Plan Note (Signed)
Low cholesterol diet and exercise.  Follow lipid panel.  Last lipid panel revealed total cholesterol 201, triglycerides 71, HDL 45 and LDL 142.  We discussed starting cholesterol medication.  She desires not to start at this time.  Wants to work on diet and try exercise.  Will follow.

## 2013-10-26 NOTE — Assessment & Plan Note (Signed)
Previously diagnosed with thrombophlebitis.  Being followed by [facility].

## 2013-10-26 NOTE — Assessment & Plan Note (Signed)
On vitamin D 50, 000 q week.  Will increase to 2x/week.  Follow.  Recheck vitamin D level with next labs.

## 2013-10-26 NOTE — Assessment & Plan Note (Signed)
Is s/p lithotripsy.  Has been released by urology.  Follow.

## 2013-10-26 NOTE — Assessment & Plan Note (Signed)
Bone density 04/05/10 revealed osteopenia.  Is vitamin D deficient.  Follow.  Recent vitamin D level low.  Continue vitamin D supplements.  Adjust dose as outlined.

## 2014-03-22 ENCOUNTER — Ambulatory Visit: Payer: Self-pay | Admitting: Internal Medicine

## 2014-03-22 LAB — HM MAMMOGRAPHY: HM Mammogram: NEGATIVE

## 2014-04-08 HISTORY — PX: TOTAL VAGINAL HYSTERECTOMY: SHX2548

## 2014-05-02 ENCOUNTER — Telehealth: Payer: Self-pay | Admitting: Internal Medicine

## 2014-05-02 ENCOUNTER — Encounter: Payer: Self-pay | Admitting: Internal Medicine

## 2014-05-02 ENCOUNTER — Ambulatory Visit (INDEPENDENT_AMBULATORY_CARE_PROVIDER_SITE_OTHER): Payer: No Typology Code available for payment source | Admitting: Internal Medicine

## 2014-05-02 VITALS — BP 137/80 | HR 94 | Temp 99.1°F | Ht 65.0 in | Wt 242.0 lb

## 2014-05-02 DIAGNOSIS — J020 Streptococcal pharyngitis: Secondary | ICD-10-CM

## 2014-05-02 LAB — POCT INFLUENZA A/B
Influenza A, POC: NEGATIVE
Influenza B, POC: NEGATIVE

## 2014-05-02 LAB — POCT RAPID STREP A (OFFICE): Rapid Strep A Screen: POSITIVE — AB

## 2014-05-02 MED ORDER — ONDANSETRON HCL 8 MG PO TABS: 8.0000 mg | ORAL_TABLET | Freq: Three times a day (TID) | ORAL | Status: DC | PRN

## 2014-05-02 MED ORDER — AMOXICILLIN-POT CLAVULANATE 875-125 MG PO TABS: 1.0000 | ORAL_TABLET | Freq: Two times a day (BID) | ORAL | Status: DC

## 2014-05-02 NOTE — Telephone Encounter (Signed)
[facility]  Station Day - [place] TELEPHONE ADVICE RECORD [facility] Patient Name: Nicole Hardy DOB: 05/01/1950 Initial Comment caller states she is weak, fever 100.2, eyes hurt and left tonsil hurts Nurse Assessment Nurse: Lane HackerHarley, RN, Elvin SoWindy Date/Time (Eastern Time): 05/02/2014 9:02:32 AM Confirm and document reason for call. If symptomatic, describe symptoms. ---Caller states she is weak, fever 100.2 by mouth just before calling, cold chills, mild left sided HA, and left tonsil hurts. Fatigued. Denies sore throat, but tender to touch "when touched on the outside." Started Saturday. Fever had been up to 101.2 on Saturday. Took Ibuprofen. Denies cough or sneeze or runny nose. Denies difficulty swallowing or breathing. C/O nausea. She vomited twice this AM, and it was more like a dry heave. Has the patient traveled out of the country within the last 30 days? ---Not Applicable Does the patient require triage? ---Yes Related visit to physician within the last 2 weeks? ---No Does the PT have any chronic conditions? (i.e. diabetes, asthma, etc.) ---No Guidelines Guideline Title Affirmed Question Affirmed Notes Vomiting [1] MILD or MODERATE vomiting AND [2] present > 48 hours (2 days) (Exception: mild vomiting with associated diarrhea) Final Disposition User See Physician within [address] [place], Charity fundraiserN, K[place]Windy

## 2014-05-02 NOTE — Assessment & Plan Note (Signed)
Symptoms, exam and rapid strep test c/w streptococcal pharyngitis. Will start Augmentin BID. Encouraged her to take probiotic yogurt while on this medication. Encouraged adequate intake of fluids. Will start prn Ondansetron as needed for nausea. Will also use ibuprofen prn fever or pain. Follow up if symptoms are not improving.

## 2014-05-02 NOTE — Progress Notes (Signed)
Pre visit review using our clinic review tool, if applicable. No additional management support is needed unless otherwise documented below in the visit note. 

## 2014-05-02 NOTE — Progress Notes (Signed)
Subjective:    [name], female    DOB: 03/16/1951, 64 y.o.   MRN: [id number]  HPI  63YO female presents for acute visit.  Saturday developed chills, fever 100F and nausea. Vomited once on Saturday night. Poor appetite. Sunday did a nose rinse because of nasal dryness. Sunday starting to feel better. Feeling tired and week. No cough, congestion. No myalgia. Sunday had some sweats. Taking Ibuprofen with some improvement in fever. Continued to have chills this morning. Temp 100.14F. No headache today, some on Saturday. Mild tender lymph nodes left neck. No sore throat. Works in a [employer].  Past medical, surgical, family and social history per today's encounter.  Review of Systems  Constitutional: Positive for fever, chills and fatigue. Negative for appetite change and unexpected weight change.  HENT: Negative for congestion, postnasal drip, rhinorrhea and sore throat.   Eyes: Negative for visual disturbance.  Respiratory: Negative for cough and shortness of breath.   Cardiovascular: Negative for chest pain, palpitations and leg swelling.  Gastrointestinal: Positive for nausea and vomiting. Negative for abdominal pain, diarrhea and constipation.  Musculoskeletal: Negative for myalgias and arthralgias.  Skin: Negative for color change and rash.  Hematological: Positive for adenopathy. Does not bruise/bleed easily.  Psychiatric/Behavioral: Negative for dysphoric mood. The patient is not nervous/anxious.        Objective:    BP 137/80 mmHg  Pulse 94  Temp(Src) 99.1 F (37.3 C) (Oral)  Ht 5\' 5"  (1.651 m)  Wt 242 lb (109.77 kg)  BMI 40.27 kg/m2  SpO2 96%  LMP 03/26/2003 Physical Exam  Constitutional: She is oriented to person, place, and time. She appears well-developed and well-nourished. No distress.  HENT:  Head: Normocephalic and atraumatic.  Right Ear: External ear normal.  Left Ear: External ear normal.  Nose: Nose normal.  Mouth/Throat: Oropharyngeal  exudate and posterior oropharyngeal erythema present.  Eyes: Conjunctivae are normal. Pupils are equal, round, and reactive to light. Right eye exhibits no discharge. Left eye exhibits no discharge. No scleral icterus.  Neck: Normal range of motion. Neck supple. No tracheal deviation present. No thyromegaly present.  Cardiovascular: Normal rate, regular rhythm, normal heart sounds and intact distal pulses.  Exam reveals no gallop and no friction rub.   No murmur heard. Pulmonary/Chest: Effort normal and breath sounds normal. No accessory muscle usage. No tachypnea. No respiratory distress. She has no decreased breath sounds. She has no wheezes. She has no rhonchi. She has no rales. She exhibits no tenderness.  Musculoskeletal: Normal range of motion. She exhibits no edema or tenderness.  Lymphadenopathy:    She has cervical adenopathy.       Left cervical: Superficial cervical (tender 1cm cervical node) adenopathy present.  Neurological: She is alert and oriented to person, place, and time. No cranial nerve deficit. She exhibits normal muscle tone. Coordination normal.  Skin: Skin is warm and dry. No rash noted. She is not diaphoretic. No erythema. No pallor.  Psychiatric: She has a normal mood and affect. Her behavior is normal. Judgment and thought content normal.          Assessment & Plan:   Problem List Items Addressed This Visit      Unprioritized   Streptococcal sore throat - Primary    Symptoms, exam and rapid strep test c/w streptococcal pharyngitis. Will start Augmentin BID. Encouraged her to take probiotic yogurt while on this medication. Encouraged adequate intake of fluids. Will start prn Ondansetron as needed for nausea. Will also use ibuprofen  prn fever or pain. Follow up if symptoms are not improving.      Relevant Medications   AMOXICILLIN-POT CLAVULANATE 875-125 MG PO TABS   ondansetron (ZOFRAN) tablet   Other Relevant Orders   POCT rapid strep A (Completed)   POCT  Influenza A/B (Completed)       Return if symptoms worsen or fail to improve.

## 2014-05-02 NOTE — Patient Instructions (Signed)

## 2014-05-02 NOTE — Telephone Encounter (Signed)
Neck sor to touch Emesis X 2 today and Emesis X1 , fever and chill since 04/29/14,  TO see Dr. Dan HumphreysWalker.

## 2014-05-06 ENCOUNTER — Ambulatory Visit: Payer: No Typology Code available for payment source | Admitting: Internal Medicine

## 2014-06-03 ENCOUNTER — Telehealth: Payer: Self-pay | Admitting: Internal Medicine

## 2014-06-03 NOTE — Telephone Encounter (Signed)
Patient said cancel voice message on phone, Thank you

## 2014-06-03 NOTE — Telephone Encounter (Signed)
Noted  

## 2014-06-07 ENCOUNTER — Encounter: Payer: Self-pay | Admitting: Internal Medicine

## 2014-06-07 LAB — LIPID PANEL
Cholesterol: 201 mg/dL — AB (ref 0–200)
HDL: 44 mg/dL (ref 35–70)
LDL Cholesterol: 141 mg/dL
Triglycerides: 81 mg/dL (ref 40–160)

## 2014-06-07 LAB — HEPATIC FUNCTION PANEL
ALT: 13 U/L (ref 7–35)
AST: 13 U/L (ref 13–35)
Alkaline Phosphatase: 71 U/L (ref 25–125)
Bilirubin, Total: 0.7 mg/dL

## 2014-06-07 LAB — CBC AND DIFFERENTIAL
HCT: 39 % (ref 36–46)
Hemoglobin: 12.8 g/dL (ref 12.0–16.0)
Neutrophils Absolute: 3 /uL
Platelets: 286 10*3/uL (ref 150–399)
WBC: 5.1 10^3/mL

## 2014-06-07 LAB — TSH: TSH: 1.5 u[IU]/mL (ref 0.41–5.90)

## 2014-06-07 LAB — BASIC METABOLIC PANEL
BUN: 12 mg/dL (ref 4–21)
Creatinine: 0.8 mg/dL (ref 0.5–1.1)
Glucose: 101 mg/dL
Potassium: 4.4 mmol/L (ref 3.4–5.3)
Sodium: 142 mmol/L (ref 137–147)

## 2014-06-08 ENCOUNTER — Encounter: Payer: Self-pay | Admitting: Internal Medicine

## 2014-06-08 ENCOUNTER — Ambulatory Visit (INDEPENDENT_AMBULATORY_CARE_PROVIDER_SITE_OTHER): Payer: No Typology Code available for payment source | Admitting: Internal Medicine

## 2014-06-08 ENCOUNTER — Ambulatory Visit: Payer: Self-pay | Admitting: Internal Medicine

## 2014-06-08 VITALS — BP 120/80 | HR 81 | Temp 98.1°F | Ht 65.0 in | Wt 242.4 lb

## 2014-06-08 DIAGNOSIS — M25552 Pain in left hip: Secondary | ICD-10-CM

## 2014-06-08 DIAGNOSIS — M8580 Other specified disorders of bone density and structure, unspecified site: Secondary | ICD-10-CM

## 2014-06-08 DIAGNOSIS — Z0000 Encounter for general adult medical examination without abnormal findings: Secondary | ICD-10-CM

## 2014-06-08 DIAGNOSIS — E7800 Pure hypercholesterolemia, unspecified: Secondary | ICD-10-CM

## 2014-06-08 DIAGNOSIS — R109 Unspecified abdominal pain: Secondary | ICD-10-CM

## 2014-06-08 DIAGNOSIS — E559 Vitamin D deficiency, unspecified: Secondary | ICD-10-CM

## 2014-06-08 NOTE — Progress Notes (Signed)
Pre visit review using our clinic review tool, if applicable. No additional management support is needed unless otherwise documented below in the visit note. 

## 2014-06-12 ENCOUNTER — Encounter: Payer: Self-pay | Admitting: Internal Medicine

## 2014-06-12 DIAGNOSIS — Z0000 Encounter for general adult medical examination without abnormal findings: Secondary | ICD-10-CM | POA: Insufficient documentation

## 2014-06-12 DIAGNOSIS — M25552 Pain in left hip: Secondary | ICD-10-CM | POA: Insufficient documentation

## 2014-06-12 DIAGNOSIS — R109 Unspecified abdominal pain: Secondary | ICD-10-CM | POA: Insufficient documentation

## 2014-06-12 NOTE — Assessment & Plan Note (Signed)
Bone density as outlined.  Restart vitamin D supplements.  Try to at least take on a weekly basis.

## 2014-06-12 NOTE — Assessment & Plan Note (Signed)
Low cholesterol diet and exercise.  LDL just checked - 140.  Declines cholesterol medication.  Follow.

## 2014-06-12 NOTE — Assessment & Plan Note (Signed)
Persistent.  Check hip xray.

## 2014-06-12 NOTE — Assessment & Plan Note (Signed)
Physical 10/25/13.  Mammogram 03/22/14 - Birads I.

## 2014-06-12 NOTE — Progress Notes (Signed)
[name], female   DOB: Mar 22, 1951, 64 y.o.   MRN: [id number]   Subjective:    [name], female    DOB: [birth year], 64 y.o.   MRN: [id number]  HPI  Patient here for a scheduled follow up.  She states she is doing well.  Still with a low vitamin D level.  Has improved some.  She has not been taking.  Restart.  Having some upper quadrant pain.  Occasionally radiates to back.  Discussed further w/up.  No vomiting.  Eating and drinking well.  Bowels stable.  Discussed weight loss. Persistent back and left hip pain.  No pain radiating down leg.     Past Medical History  Diagnosis Date  . Hypercholesterolemia   . Vitamin D deficiency   . Nephrolithiasis   . Degenerative disc disease, cervical     cervical  . Meralgia paraesthetica   . Neurodermatitis   . Osteopenia     Current Outpatient Prescriptions on File Prior to Visit  Medication Sig Dispense Refill  . nystatin cream (MYCOSTATIN) Apply 1 application topically 2 (two) times daily. (Patient taking differently: Apply 1 application topically 2 (two) times daily as needed. ) 30 g 1  . Vitamin D, Ergocalciferol, (DRISDOL) 50000 UNITS CAPS capsule Take one 2x/week. 8 capsule 4   No current facility-administered medications on file prior to visit.    Review of Systems  Constitutional: Positive for fatigue. Negative for appetite change and unexpected weight change.  HENT: Negative for congestion and sinus pressure.   Respiratory: Negative for cough, chest tightness and shortness of breath.   Cardiovascular: Negative for chest pain, palpitations and leg swelling.  Gastrointestinal: Positive for abdominal pain. Negative for nausea, vomiting and diarrhea.  Genitourinary: Negative for dysuria and difficulty urinating.  Musculoskeletal: Positive for back pain.       Left hip pain.   Skin: Negative for color change and rash.  Neurological: Negative for dizziness, light-headedness and headaches.    Psychiatric/Behavioral: Negative for dysphoric mood and agitation.       Objective:    Physical Exam  Constitutional: She appears well-developed and well-nourished. No distress.  HENT:  Nose: Nose normal.  Mouth/Throat: Oropharynx is clear and moist.  Neck: Neck supple. No thyromegaly present.  Cardiovascular: Normal rate and regular rhythm.   Pulmonary/Chest: Breath sounds normal. No respiratory distress. She has no wheezes.  Abdominal: Soft. Bowel sounds are normal. There is no tenderness.  Musculoskeletal: She exhibits no edema or tenderness.  Lymphadenopathy:    She has no cervical adenopathy.  Skin: No rash noted. No erythema.    BP 120/80 mmHg  Pulse 81  Temp(Src) 98.1 F (36.7 C) (Oral)  Ht  (1.651 m)  Wt 242 lb 6 oz (109.941 kg)  BMI 40.33 kg/m2  SpO2 97%  LMP 03/26/2003 Wt Readings from Last 3 Encounters:  06/08/14 242 lb 6 oz (109.941 kg)  05/02/14 242 lb (109.77 kg)  10/25/13 251 lb (113.853 kg)     Lab Results  Component Value Date   WBC 5.1 06/07/2014   HGB 12.8 06/07/2014   HCT 39 06/07/2014   PLT 286 06/07/2014   CHOL 201* 06/07/2014   TRIG 81 06/07/2014   HDL 44 06/07/2014   LDLCALC 141 06/07/2014   ALT 13 06/07/2014   AST 13 06/07/2014   NA 142 06/07/2014   K 4.4 06/07/2014   CREATININE 0.8 06/07/2014   BUN 12 06/07/2014   TSH 1.50 06/07/2014  Assessment & Plan:   Problem List Items Addressed This Visit    Abdominal pain    Persistent.  Check abdominal ultrasound.  Recent liver panel wnl.        Relevant Orders   US Abdomen Complete   Health care maintenance    Physical 10/25/13.  Mammogram 03/22/14 - Birads I.        Hypercholesterolemia    Low cholesterol diet and exercise.  LDL just checked - 140.  Declines cholesterol medication.  Follow.        Left hip pain    Persistent.  Check hip xray.        Osteopenia    Bone density as outlined.  Restart vitamin D supplements.  Try to at least take on a weekly  basis.        Vitamin D deficiency - Primary    Not taking her supplements.  Restart.  Follow.          I spent 25 minutes with the patient and more than 50% of the time was spent in consultation regarding the above.     Dale DurhamSCOTT, Stormee Duda, MD

## 2014-06-12 NOTE — Assessment & Plan Note (Signed)
Persistent.  Check abdominal ultrasound.  Recent liver panel wnl.

## 2014-06-12 NOTE — Assessment & Plan Note (Signed)
Not taking her supplements.  Restart.  Follow.

## 2014-06-16 ENCOUNTER — Telehealth: Payer: Self-pay | Admitting: Internal Medicine

## 2014-06-16 ENCOUNTER — Encounter: Payer: Self-pay | Admitting: *Deleted

## 2014-06-16 NOTE — Telephone Encounter (Signed)
Pt notified of results & letter mailed to patient per patients request

## 2014-06-16 NOTE — Telephone Encounter (Signed)
LMTCB

## 2014-06-16 NOTE — Telephone Encounter (Signed)
Notify pt that her back xray reveals mild disc space narrowing in the lower back (L4-5).  There are small kidney stones present - on the left.  There is no acute abnormality of the hips or pelvis.  Given the persistent pain, would like to refer her to physical therapy for further evaluation and treatment.    Dr Lorin PicketScott

## 2014-06-24 ENCOUNTER — Telehealth: Payer: Self-pay | Admitting: Internal Medicine

## 2014-06-24 NOTE — Telephone Encounter (Signed)
Pt notified of back xray results and recommendations.  Wanted to refer to physical therapy.  Also informed of kidney stones.  She has a history of kidney stones.  Has seen Dr Evelene CroonWolff in the past.  Offered referral back to urology.  She will notify me if agreeable for referrals.  Informed to let me know.  All questions answered.

## 2014-07-18 ENCOUNTER — Encounter: Payer: Self-pay | Admitting: Internal Medicine

## 2014-11-07 ENCOUNTER — Telehealth: Payer: Self-pay | Admitting: *Deleted

## 2014-11-07 NOTE — Telephone Encounter (Signed)
Pt called states she has kidney stones as per the radiology results.  States she is in pain however she has an appoint tomorrow 8.2.16 with Urology. Pt is requesting a prescription for something for pain.  Please advise

## 2014-11-07 NOTE — Telephone Encounter (Signed)
I am unable to call in pain medication without seeing her.  If was seen and had test, would call MD who ordered the test for pain medication.  If severe pain, eval this pm.

## 2014-11-07 NOTE — Telephone Encounter (Signed)
Left message on VM to return call 

## 2014-11-16 ENCOUNTER — Encounter: Payer: Self-pay | Admitting: *Deleted

## 2014-11-17 ENCOUNTER — Encounter: Payer: Self-pay | Admitting: *Deleted

## 2014-11-17 ENCOUNTER — Ambulatory Visit
Admission: RE | Admit: 2014-11-17 | Discharge: 2014-11-17 | Disposition: A | Discharge status: Home / Self Care | Payer: PRIVATE HEALTH INSURANCE | Source: Ambulatory Visit | Attending: Urology | Admitting: Urology

## 2014-11-17 ENCOUNTER — Encounter
Admission: RE | Disposition: A | Discharge status: Home / Self Care | Payer: Self-pay | Source: Ambulatory Visit | Attending: Urology

## 2014-11-17 ENCOUNTER — Ambulatory Visit: Payer: PRIVATE HEALTH INSURANCE

## 2014-11-17 DIAGNOSIS — Z09 Encounter for follow-up examination after completed treatment for conditions other than malignant neoplasm: Secondary | ICD-10-CM | POA: Insufficient documentation

## 2014-11-17 DIAGNOSIS — Z87442 Personal history of urinary calculi: Secondary | ICD-10-CM | POA: Insufficient documentation

## 2014-11-17 DIAGNOSIS — Z79899 Other long term (current) drug therapy: Secondary | ICD-10-CM | POA: Diagnosis not present

## 2014-11-17 DIAGNOSIS — Z8249 Family history of ischemic heart disease and other diseases of the circulatory system: Secondary | ICD-10-CM | POA: Diagnosis not present

## 2014-11-17 DIAGNOSIS — R109 Unspecified abdominal pain: Secondary | ICD-10-CM | POA: Diagnosis present

## 2014-11-17 DIAGNOSIS — Z803 Family history of malignant neoplasm of breast: Secondary | ICD-10-CM | POA: Insufficient documentation

## 2014-11-17 DIAGNOSIS — E669 Obesity, unspecified: Secondary | ICD-10-CM | POA: Diagnosis not present

## 2014-11-17 DIAGNOSIS — N200 Calculus of kidney: Secondary | ICD-10-CM | POA: Insufficient documentation

## 2014-11-17 DIAGNOSIS — G43909 Migraine, unspecified, not intractable, without status migrainosus: Secondary | ICD-10-CM | POA: Insufficient documentation

## 2014-11-17 DIAGNOSIS — Z888 Allergy status to other drugs, medicaments and biological substances status: Secondary | ICD-10-CM | POA: Insufficient documentation

## 2014-11-17 HISTORY — DX: Migraine, unspecified, not intractable, without status migrainosus: G43.909

## 2014-11-17 HISTORY — DX: Obesity, unspecified: E66.9

## 2014-11-17 HISTORY — PX: EXTRACORPOREAL SHOCK WAVE LITHOTRIPSY: SHX1557

## 2014-11-17 SURGERY — LITHOTRIPSY, ESWL
Anesthesia: Moderate Sedation | Laterality: Right

## 2014-11-17 MED ORDER — ONDANSETRON 8 MG PO TBDP: 8.0000 mg | ORAL_TABLET | Freq: Four times a day (QID) | ORAL | Status: DC | PRN

## 2014-11-17 MED ORDER — MORPHINE SULFATE 10 MG/ML IJ SOLN
10.0000 mg | Freq: Once | INTRAMUSCULAR | Status: AC
  Administered 2014-11-17: 10 mg via INTRAMUSCULAR

## 2014-11-17 MED ORDER — LEVOFLOXACIN 500 MG PO TABS
ORAL_TABLET | ORAL | Status: AC
  Filled 2014-11-17: qty 1

## 2014-11-17 MED ORDER — MIDAZOLAM HCL 2 MG/2ML IJ SOLN
1.0000 mg | Freq: Once | INTRAMUSCULAR | Status: AC
  Administered 2014-11-17: 2 mg via INTRAMUSCULAR

## 2014-11-17 MED ORDER — DEXTROSE-NACL 5-0.45 % IV SOLN
INTRAVENOUS | Status: DC
  Administered 2014-11-17: 07:00:00 via INTRAVENOUS

## 2014-11-17 MED ORDER — MIDAZOLAM HCL 2 MG/2ML IJ SOLN
INTRAMUSCULAR | Status: AC
  Filled 2014-11-17: qty 2

## 2014-11-17 MED ORDER — PROMETHAZINE HCL 25 MG/ML IJ SOLN
25.0000 mg | Freq: Once | INTRAMUSCULAR | Status: AC
  Administered 2014-11-17: 25 mg via INTRAMUSCULAR

## 2014-11-17 MED ORDER — PROMETHAZINE HCL 25 MG/ML IJ SOLN
INTRAMUSCULAR | Status: AC
  Filled 2014-11-17: qty 1

## 2014-11-17 MED ORDER — NUCYNTA 50 MG PO TABS: 50.0000 mg | ORAL_TABLET | Freq: Four times a day (QID) | ORAL | Status: DC | PRN

## 2014-11-17 MED ORDER — LEVOFLOXACIN 500 MG PO TABS: 500.0000 mg | ORAL_TABLET | Freq: Every day | ORAL | Status: DC

## 2014-11-17 MED ORDER — DIPHENHYDRAMINE HCL 25 MG PO CAPS
ORAL_CAPSULE | ORAL | Status: AC
  Filled 2014-11-17: qty 1

## 2014-11-17 MED ORDER — DIPHENHYDRAMINE HCL 25 MG PO CAPS
25.0000 mg | ORAL_CAPSULE | ORAL | Status: AC
  Administered 2014-11-17: 25 mg via ORAL

## 2014-11-17 MED ORDER — FUROSEMIDE 10 MG/ML IJ SOLN
INTRAMUSCULAR | Status: AC
  Administered 2014-11-17: 10 mg
  Filled 2014-11-17: qty 2

## 2014-11-17 MED ORDER — LEVOFLOXACIN 500 MG PO TABS
500.0000 mg | ORAL_TABLET | ORAL | Status: AC
  Administered 2014-11-17: 500 mg via ORAL

## 2014-11-17 MED ORDER — FUROSEMIDE 10 MG/ML IJ SOLN
10.0000 mg | Freq: Once | INTRAMUSCULAR | Status: AC
  Administered 2014-11-17: 10 mg via INTRAVENOUS

## 2014-11-17 MED ORDER — MORPHINE SULFATE 10 MG/ML IJ SOLN
INTRAMUSCULAR | Status: DC
Start: 2014-11-17 — End: 2014-11-17
  Filled 2014-11-17: qty 1

## 2014-11-17 NOTE — Progress Notes (Signed)
Pt voided and strained for stones   None detected

## 2014-11-17 NOTE — H&P (Signed)
Date of Initial H&P: 11/16/14 History reviewed, patient examined, no change in status, stable for surgery.

## 2014-11-17 NOTE — Discharge Instructions (Addendum)
Lithotripsy for Kidney Stones °Lithotripsy is a treatment that can sometimes help eliminate kidney stones and pain that they cause. A form of lithotripsy, also known as extracorporeal shock wave lithotripsy, is a nonsurgical procedure that helps your body rid itself of the kidney stone when it is too big to pass on its own. Extracorporeal shock wave lithotripsy is a method of crushing a kidney stone with shock waves. These shock waves pass through your body and are focused on your stone. They cause the kidney stones to crumble while still in the urinary tract. It is then easier for the smaller pieces of stone to pass in the urine. °Lithotripsy usually takes about an hour. It is done in a hospital, a lithotripsy center, or a mobile unit. It usually does not require an overnight stay. Your health care provider will instruct you on preparation for the procedure. Your health care provider will tell you what to expect afterward. °LET YOUR HEALTH CARE PROVIDER KNOW ABOUT: °· Any allergies you have. °· All medicines you are taking, including vitamins, herbs, eye drops, creams, and over-the-counter medicines. °· Previous problems you or members of your family have had with the use of anesthetics. °· Any blood disorders you have. °· Previous surgeries you have had. °· Medical conditions you have. °RISKS AND COMPLICATIONS °Generally, lithotripsy for kidney stones is a safe procedure. However, as with any procedure, complications can occur. Possible complications include: °· Infection. °· Bleeding of the kidney. °· Bruising of the kidney or skin. °· Obstruction of the ureter. °· Failure of the stone to fragment. °BEFORE THE PROCEDURE °· Do not eat or drink for 6-8 hours prior to the procedure. You may, however, take the medications with a sip of water that your physician instructs you to take °· Do not take aspirin or aspirin-containing products for 7 days prior to your procedure °· Do not take nonsteroidal anti-inflammatory  products for 7 days prior to your procedure °PROCEDURE °A stent (flexible tube with holes) may be placed in your ureter. The ureter is the tube that transports the urine from the kidneys to the bladder. Your health care provider may place a stent before the procedure. This will help keep urine flowing from the kidney if the fragments of the stone block the ureter. You may have an IV tube placed in one of your veins to give you fluids and medicines. These medicines may help you relax or make you sleep. During the procedure, you will lie comfortably on a fluid-filled cushion or in a warm-water bath. After an X-ray or ultrasound exam to locate your stone, shock waves are aimed at the stone. If you are awake, you may feel a tapping sensation as the shock waves pass through your body. If large stone particles remain after treatment, a second procedure may be necessary at a later date. °For comfort during the test: °· Relax as much as possible. °· Try to remain still as much as possible. °· Try to follow instructions to speed up the test. °· Let your health care provider know if you are uncomfortable, anxious, or in pain. °AFTER THE PROCEDURE  °After surgery, you will be taken to the recovery area. A nurse will watch and check your progress. Once you're awake, stable, and taking fluids well, you will be allowed to go home as long as there are no problems. You will also be allowed to pass your urine before discharge. You may be given antibiotics to help prevent infection. You may also be prescribed   pain medicine if needed. In a week or two, your health care provider may remove your stent, if you have one. You may first have an X-ray exam to check on how successful the fragmentation of your stone has been and how much of the stone has passed. Your health care provider will check to see whether or not stone particles remain. SEEK IMMEDIATE MEDICAL CARE IF:  You develop a fever or shaking chills.  Your pain is not  relieved by medicine.  You feel sick to your stomach (nauseated) and you vomit.  You develop heavy bleeding.  You have difficulty urinating.  You start to pass your stent from your penis. Document Released: 03/22/2000 Document Revised: 01/13/2013 Document Reviewed: 10/08/2012 [facility] Patient Information 2015 [place], [place]. This information is not intended to replace advice given to you by your health care provider. Make sure you discuss any questions you have with your health care provider.  Dietary Guidelines to Help Prevent Kidney Stones Your risk of kidney stones can be decreased by adjusting the foods you eat. The most important thing you can do is drink enough fluid. You should drink enough fluid to keep your urine clear or pale yellow. The following guidelines provide specific information for the type of kidney stone you have had. GUIDELINES ACCORDING TO TYPE OF KIDNEY STONE Calcium Oxalate Kidney Stones  Reduce the amount of salt you eat. Foods that have a lot of salt cause your body to release excess calcium into your urine. The excess calcium can combine with a substance called oxalate to form kidney stones.  Reduce the amount of animal protein you eat if the amount you eat is excessive. Animal protein causes your body to release excess calcium into your urine. Ask your dietitian how much protein from animal sources you should be eating.  Avoid foods that are high in oxalates. If you take vitamins, they should have less than 500 mg of vitamin C. Your body turns vitamin C into oxalates. You do not need to avoid fruits and vegetables high in vitamin C. Calcium Phosphate Kidney Stones  Reduce the amount of salt you eat to help prevent the release of excess calcium into your urine.  Reduce the amount of animal protein you eat if the amount you eat is excessive. Animal protein causes your body to release excess calcium into your urine. Ask your dietitian how much protein from animal  sources you should be eating.  Get enough calcium from food or take a calcium supplement (ask your dietitian for recommendations). Food sources of calcium that do not increase your risk of kidney stones include:  Broccoli.  Dairy products, such as cheese and yogurt.  Pudding. Uric Acid Kidney Stones  Do not have more than 6 oz of animal protein per day. FOOD SOURCES Animal Protein Sources  Meat (all types).  Poultry.  Eggs.  Fish, seafood. Foods High in [employer] seasonings.  Soy sauce.  Teriyaki sauce.  Cured and processed meats.  Salted crackers and snack foods.  Fast food.  Canned soups and most canned foods. Foods High in Oxalates  Grains:  Amaranth.  Barley.  Grits.  Wheat germ.  Bran.  Buckwheat flour.  All bran cereals.  Pretzels.  Whole wheat bread.  Vegetables:  Beans (wax).  Beets and beet greens.  Collard greens.  Eggplant.  [place]scarole.  Leeks.  Okra.  Parsley.  Rutabagas.  Spinach.  Swiss chard.  Tomato paste.  Fried potatoes.  Sweet potatoes.  Fruits:  Red  currants.  Figs.  Kiwi.  Rhubarb.  Meat and Other Protein Sources:  Beans (dried).  Soy burgers and other soybean products.  Miso.  Nuts (peanuts, almonds, pecans, cashews, hazelnuts).  Nut butters.  Sesame seeds and tahini (paste made of sesame seeds).  Poppy seeds.  Beverages:  Chocolate drink mixes.  Soy milk.  Instant iced tea.  Juices made from high-oxalate fruits or vegetables.  Other:  Carob.  Chocolate.  Fruitcake.  Marmalades. Document Released: 07/20/2010 Document Revised: 03/30/2013 Document Reviewed: 02/19/2013 [facility] Patient Information 2015 [place], [place]. This information is not intended to replace advice given to you by your health care provider. Make sure you discuss any questions you have with your health care provider. AMBULATORY SURGERY  DISCHARGE INSTRUCTIONS   1) The drugs that you were  given will stay in your system until tomorrow so for the next 24 hours you should not:  A) Drive an automobile B) Make any legal decisions C) Drink any alcoholic beverage   2) You may resume regular meals tomorrow.  Today it is better to start with liquids and gradually work up to solid foods.  You may eat anything you prefer, but it is better to start with liquids, then soup and crackers, and gradually work up to solid foods.   3) Please notify your doctor immediately if you have any unusual bleeding, trouble breathing, redness and pain at the surgery site, drainage, fever, or pain not relieved by medication.    4) Additional Instructions:        Please contact your physician with any problems or Same Day Surgery at [phone], Monday through Friday 6 am to 4 pm, or [place] [facility] number at [phone].AMBULATORY SURGERY  DISCHARGE INSTRUCTIONS   5) The drugs that you were given will stay in your system until tomorrow so for the next 24 hours you should not:  D) Drive an automobile E) Make any legal decisions F) Drink any alcoholic beverage   6) You may resume regular meals tomorrow.  Today it is better to start with liquids and gradually work up to solid foods.  You may eat anything you prefer, but it is better to start with liquids, then soup and crackers, and gradually work up to solid foods.   7) Please notify your doctor immediately if you have any unusual bleeding, trouble breathing, redness and pain at the surgery site, drainage, fever, or pain not relieved by medication.    8) Additional Instructions:        Please contact your physician with any problems or Same Day Surgery at [phone], Monday through Friday 6 am to 4 pm, or [place] [facility] number at [phone].

## 2014-12-01 ENCOUNTER — Encounter: Payer: Self-pay | Admitting: Internal Medicine

## 2014-12-01 ENCOUNTER — Ambulatory Visit (INDEPENDENT_AMBULATORY_CARE_PROVIDER_SITE_OTHER): Payer: PRIVATE HEALTH INSURANCE | Admitting: Internal Medicine

## 2014-12-01 VITALS — BP 118/70 | HR 78 | Temp 98.3°F | Ht 65.0 in | Wt 248.5 lb

## 2014-12-01 DIAGNOSIS — Z0000 Encounter for general adult medical examination without abnormal findings: Secondary | ICD-10-CM

## 2014-12-01 DIAGNOSIS — M25562 Pain in left knee: Secondary | ICD-10-CM | POA: Diagnosis not present

## 2014-12-01 DIAGNOSIS — E7800 Pure hypercholesterolemia, unspecified: Secondary | ICD-10-CM

## 2014-12-01 DIAGNOSIS — R109 Unspecified abdominal pain: Secondary | ICD-10-CM

## 2014-12-01 DIAGNOSIS — M8580 Other specified disorders of bone density and structure, unspecified site: Secondary | ICD-10-CM

## 2014-12-01 DIAGNOSIS — Z1239 Encounter for other screening for malignant neoplasm of breast: Secondary | ICD-10-CM | POA: Diagnosis not present

## 2014-12-01 DIAGNOSIS — R0981 Nasal congestion: Secondary | ICD-10-CM

## 2014-12-01 DIAGNOSIS — E559 Vitamin D deficiency, unspecified: Secondary | ICD-10-CM

## 2014-12-01 DIAGNOSIS — M25552 Pain in left hip: Secondary | ICD-10-CM

## 2014-12-01 DIAGNOSIS — N200 Calculus of kidney: Secondary | ICD-10-CM

## 2014-12-01 NOTE — Patient Instructions (Signed)
Saline nasal spray - flush nose at least 2-3x/day  nasacort nasal spray - 2 sprays each nostril one time per day.  Do this in the evening.  

## 2014-12-01 NOTE — Progress Notes (Signed)
[name], female   DOB: 08-11-1950, 64 y.o.   MRN: [id number]   Subjective:    [name], female    DOB: 1950-04-15, 64 y.o.   MRN: [id number]  HPI  Patient here to follow up on her current medical issues as well as for a complete physical exam.  She has been followed by urology (Dr Evelene Croon) for kidney stones and uti.  S/p lithotripsy 11/18/14.  She is planning to have lithotripsy (left) - 12/15/14.  This past weekend had left lower quadrant pain.  No trigger.  No change in bowel function.  No nausea or vomiting.  No pain since the flare.  She does report some increased sinus congestion and nasal congestion.  Some cough.  Started a few days ago.  No sob.  No cardiac symptoms with increased activity or exertion.  Bowels stable.     Past Medical History  Diagnosis Date  . Hypercholesterolemia   . Vitamin D deficiency   . Nephrolithiasis   . Degenerative disc disease, cervical     cervical  . Meralgia paraesthetica   . Neurodermatitis   . Osteopenia   . Migraines   . Obesity    Past Surgical History  Procedure Laterality Date  . Laparoscopic surgery  1990    nephrolithiasis (Dr Orson Slick)  . Lithotripsy  2010, 1980's   Family History  Problem Relation Age of Onset  . Hypertension Father   . Hypertension Mother   . Asthma Sister   . Hypertension Sister   . Diabetes Sister   . Breast cancer Sister   . Rectal cancer Sister    Social History   Social History  . Marital Status: Married    Spouse Name: N/A  . Number of Children: 1  . Years of Education: N/A   Social History Main Topics  . Smoking status: Never Smoker   . Smokeless tobacco: Never Used  . Alcohol Use: No  . Drug Use: No  . Sexual Activity: Not Asked   Other Topics Concern  . None   Social History Narrative    Outpatient Encounter Prescriptions as of 12/01/2014  Medication Sig  . nystatin cream (MYCOSTATIN) Apply 1 application topically 2 (two) times daily. (Patient taking  differently: Apply 1 application topically 2 (two) times daily as needed. )  . [DISCONTINUED] HYDROcodone-acetaminophen (NORCO/VICODIN) 5-325 MG per tablet Take 1 tablet by mouth every 4 (four) hours as needed for moderate pain.  . [DISCONTINUED] levofloxacin (LEVAQUIN) 500 MG tablet Take 1 tablet (500 mg total) by mouth daily.  . [DISCONTINUED] NUCYNTA 50 MG TABS tablet Take 1 tablet (50 mg total) by mouth every 6 (six) hours as needed for moderate pain. 1 TO 2 TABS Q 6 HOURS PRN PAIN  . [DISCONTINUED] ondansetron (ZOFRAN ODT) 8 MG disintegrating tablet Take 1 tablet (8 mg total) by mouth every 6 (six) hours as needed for nausea or vomiting.  . [DISCONTINUED] Vitamin D, Ergocalciferol, (DRISDOL) 50000 UNITS CAPS capsule Take one 2x/week.   No facility-administered encounter medications on file as of 12/01/2014.    Review of Systems  Constitutional: Negative for appetite change and unexpected weight change.  HENT: Positive for congestion (increased sinus congestion and nasal congestion.  ) and sinus pressure. Negative for sore throat.   Eyes: Negative for pain and visual disturbance.  Respiratory: Positive for cough. Negative for chest tightness and shortness of breath.   Cardiovascular: Negative for chest pain, palpitations and leg swelling.  Gastrointestinal: Negative for nausea,  vomiting, abdominal pain and diarrhea.       Previous lower quadrant pain resolved.    Genitourinary: Negative for dysuria and difficulty urinating.  Musculoskeletal: Negative for back pain and joint swelling.  Skin: Negative for color change and rash.  Neurological: Negative for dizziness, light-headedness and headaches.  Hematological: Negative for adenopathy. Does not bruise/bleed easily.  Psychiatric/Behavioral: Negative for dysphoric mood and agitation.       Objective:    Physical Exam  Constitutional: She is oriented to person, place, and time. She appears well-developed and well-nourished.  HENT:    Nose: Nose normal.  Mouth/Throat: Oropharynx is clear and moist.  Eyes: Right eye exhibits no discharge. Left eye exhibits no discharge. No scleral icterus.  Neck: Neck supple. No thyromegaly present.  Cardiovascular: Normal rate and regular rhythm.   Pulmonary/Chest: Breath sounds normal. No accessory muscle usage. No tachypnea. No respiratory distress. She has no decreased breath sounds. She has no wheezes. She has no rhonchi. Right breast exhibits no inverted nipple, no mass, no nipple discharge and no tenderness (no axillary adenopathy). Left breast exhibits no inverted nipple, no mass, no nipple discharge and no tenderness (no axilarry adenopathy).  Abdominal: Soft. Bowel sounds are normal. There is no tenderness.  Musculoskeletal: She exhibits no edema or tenderness.  Lymphadenopathy:    She has no cervical adenopathy.  Neurological: She is alert and oriented to person, place, and time.  Skin: Skin is warm. No rash noted. No erythema.  Psychiatric: She has a normal mood and affect. Her behavior is normal.    BP 118/70 mmHg  Pulse 78  Temp(Src) 98.3 F (36.8 C) (Oral)  Ht 5\' 5"  (1.651 m)  Wt 248 lb 8 oz (112.719 kg)  BMI 41.35 kg/m2  SpO2 96%  LMP 03/26/2003 Wt Readings from Last 3 Encounters:  12/01/14 248 lb 8 oz (112.719 kg)  11/17/14 243 lb (110.224 kg)  06/08/14 242 lb 6 oz (109.941 kg)     Lab Results  Component Value Date   WBC 5.1 06/07/2014   HGB 12.8 06/07/2014   HCT 39 06/07/2014   PLT 286 06/07/2014   CHOL 201* 06/07/2014   TRIG 81 06/07/2014   HDL 44 06/07/2014   LDLCALC 141 06/07/2014   ALT 13 06/07/2014   AST 13 06/07/2014   NA 142 06/07/2014   K 4.4 06/07/2014   CREATININE 0.8 06/07/2014   BUN 12 06/07/2014   TSH 1.50 06/07/2014    Dg Abd 1 View  11/17/2014   CLINICAL DATA:  Kidney stones.  EXAM: ABDOMEN - 1 VIEW  COMPARISON:  11/17/2008.  FINDINGS: Bilateral nephrolithiasis again noted. No bowel distention. No acute bony abnormality. Pessary  noted.  IMPRESSION: Bilateral nephrolithiasis again noted.  No evidence of urolithiasis.   Electronically Signed   By: Maisie Fus  Register   On: 11/17/2014 08:42       Assessment & Plan:   Problem List Items Addressed This Visit    Abdominal pain    The left lower quadrant pain has resolved.  No other abdominal pain or cramping.        Health care maintenance    Physical today 12/01/14.  Mammogram 03/22/14 - Birads I.  Had colonoscopy (Dr Niel Hummer).  Need results.        Hypercholesterolemia    Low cholesterol diet and exercise.  Follow lipid panel.  She declines cholesterol medication.        Left hip pain    Feels pain more coming from left  knee.  Check left knee xray.  Further w/up pending.        Nephrolithiasis    Seeing Dr Evelene Croon.  S/p lithotripsy.  Planning for f/u 12/15/14.        Osteopenia    Previous bone density with osteopenia.  Follow vitamin D level.  On replacement.       Sinus congestion    Increased nasal congestion and sinus congestion.  Do not feel abx warranted.  nasacort nasal spray and saline nasal spray as directed.  Follow.  Notify me if persistent problems.        Vitamin D deficiency    Continue vitamin D supplements.         Other Visit Diagnoses    Screening breast examination    -  Primary    Relevant Orders    MM DIGITAL SCREENING BILATERAL    Left knee pain        Relevant Orders    DG Knee 1-2 Views Left        Dale [place], MD

## 2014-12-01 NOTE — Progress Notes (Signed)
Pre-visit discussion using our clinic review tool. No additional management support is needed unless otherwise documented below in the visit note.  

## 2014-12-04 ENCOUNTER — Encounter: Payer: Self-pay | Admitting: Internal Medicine

## 2014-12-04 NOTE — Assessment & Plan Note (Addendum)
Physical today 12/01/14.  Mammogram 03/22/14 - Birads I.  Had colonoscopy (Dr Niel Hummer).  Need results.

## 2014-12-04 NOTE — Assessment & Plan Note (Signed)
Previous bone density with osteopenia.  Follow vitamin D level.  On replacement.

## 2014-12-04 NOTE — Assessment & Plan Note (Signed)
Increased nasal congestion and sinus congestion.  Do not feel abx warranted.  nasacort nasal spray and saline nasal spray as directed.  Follow.  Notify me if persistent problems.

## 2014-12-04 NOTE — Assessment & Plan Note (Signed)
Seeing Dr Evelene Croon.  S/p lithotripsy.  Planning for f/u 12/15/14.

## 2014-12-04 NOTE — Assessment & Plan Note (Signed)
Low cholesterol diet and exercise.  Follow lipid panel.  She declines cholesterol medication.   

## 2014-12-04 NOTE — Assessment & Plan Note (Signed)
Continue vitamin D supplements.  

## 2014-12-04 NOTE — Assessment & Plan Note (Signed)
Feels pain more coming from left knee.  Check left knee xray.  Further w/up pending.

## 2014-12-04 NOTE — Assessment & Plan Note (Signed)
The left lower quadrant pain has resolved.  No other abdominal pain or cramping.

## 2014-12-07 ENCOUNTER — Telehealth: Payer: Self-pay | Admitting: *Deleted

## 2014-12-07 NOTE — Telephone Encounter (Signed)
-----   Message from Nicole [place], MD sent at 12/04/2014  4:48 PM EDT ----- Regarding: colonoscopy results Need colonoscopy results from Dr Niel Hummer.  He was at [facility].  Colonoscopy was performed at [facility].  (states was done a few years ago).  Need results. Thanks.    Dr Lorin Picket

## 2014-12-07 NOTE — Telephone Encounter (Signed)
Records requested

## 2014-12-15 ENCOUNTER — Encounter
Admission: RE | Disposition: A | Discharge status: Home / Self Care | Payer: Self-pay | Source: Ambulatory Visit | Attending: Urology

## 2014-12-15 ENCOUNTER — Ambulatory Visit: Payer: PRIVATE HEALTH INSURANCE

## 2014-12-15 ENCOUNTER — Ambulatory Visit
Admission: RE | Admit: 2014-12-15 | Discharge: 2014-12-15 | Disposition: A | Discharge status: Home / Self Care | Payer: PRIVATE HEALTH INSURANCE | Source: Ambulatory Visit | Attending: Urology | Admitting: Urology

## 2014-12-15 ENCOUNTER — Encounter: Payer: Self-pay | Admitting: *Deleted

## 2014-12-15 DIAGNOSIS — G43909 Migraine, unspecified, not intractable, without status migrainosus: Secondary | ICD-10-CM | POA: Insufficient documentation

## 2014-12-15 DIAGNOSIS — N200 Calculus of kidney: Secondary | ICD-10-CM | POA: Insufficient documentation

## 2014-12-15 DIAGNOSIS — Z888 Allergy status to other drugs, medicaments and biological substances status: Secondary | ICD-10-CM | POA: Diagnosis not present

## 2014-12-15 DIAGNOSIS — Z79899 Other long term (current) drug therapy: Secondary | ICD-10-CM | POA: Diagnosis not present

## 2014-12-15 DIAGNOSIS — E669 Obesity, unspecified: Secondary | ICD-10-CM | POA: Diagnosis not present

## 2014-12-15 HISTORY — PX: EXTRACORPOREAL SHOCK WAVE LITHOTRIPSY: SHX1557

## 2014-12-15 SURGERY — LITHOTRIPSY, ESWL
Anesthesia: Moderate Sedation | Laterality: Left

## 2014-12-15 MED ORDER — ONDANSETRON 8 MG PO TBDP: 8.0000 mg | ORAL_TABLET | Freq: Four times a day (QID) | ORAL | Status: DC | PRN

## 2014-12-15 MED ORDER — PROMETHAZINE HCL 25 MG/ML IJ SOLN
25.0000 mg | Freq: Once | INTRAMUSCULAR | Status: AC
  Administered 2014-12-15: 25 mg via INTRAMUSCULAR

## 2014-12-15 MED ORDER — MIDAZOLAM HCL 2 MG/2ML IJ SOLN
1.0000 mg | Freq: Once | INTRAMUSCULAR | Status: AC
  Administered 2014-12-15: 1 mg via INTRAMUSCULAR

## 2014-12-15 MED ORDER — MORPHINE SULFATE (PF) 10 MG/ML IV SOLN
10.0000 mg | Freq: Once | INTRAVENOUS | Status: AC
  Administered 2014-12-15: 10 mg via INTRAMUSCULAR

## 2014-12-15 MED ORDER — DIPHENHYDRAMINE HCL 25 MG PO CAPS
25.0000 mg | ORAL_CAPSULE | ORAL | Status: AC
  Administered 2014-12-15: 25 mg via ORAL

## 2014-12-15 MED ORDER — DEXTROSE-NACL 5-0.45 % IV SOLN
INTRAVENOUS | Status: DC
Start: 2014-12-15 — End: 2014-12-15
  Administered 2014-12-15: 07:00:00 via INTRAVENOUS

## 2014-12-15 MED ORDER — FUROSEMIDE 10 MG/ML IJ SOLN
INTRAMUSCULAR | Status: AC
  Filled 2014-12-15: qty 2

## 2014-12-15 MED ORDER — PROMETHAZINE HCL 25 MG RE SUPP: 25.0000 mg | Freq: Four times a day (QID) | RECTAL | Status: DC | PRN

## 2014-12-15 MED ORDER — MIDAZOLAM HCL 2 MG/2ML IJ SOLN
INTRAMUSCULAR | Status: AC
  Filled 2014-12-15: qty 2

## 2014-12-15 MED ORDER — FUROSEMIDE 10 MG/ML IJ SOLN
10.0000 mg | Freq: Once | INTRAMUSCULAR | Status: AC
  Administered 2014-12-15: 10 mg via INTRAVENOUS

## 2014-12-15 MED ORDER — LEVOFLOXACIN 500 MG PO TABS
500.0000 mg | ORAL_TABLET | ORAL | Status: AC
  Administered 2014-12-15: 500 mg via ORAL

## 2014-12-15 MED ORDER — DIPHENHYDRAMINE HCL 25 MG PO CAPS
ORAL_CAPSULE | ORAL | Status: AC
  Filled 2014-12-15: qty 1

## 2014-12-15 MED ORDER — NUCYNTA 50 MG PO TABS: 50.0000 mg | ORAL_TABLET | Freq: Four times a day (QID) | ORAL | Status: DC | PRN

## 2014-12-15 MED ORDER — PROMETHAZINE HCL 25 MG/ML IJ SOLN
INTRAMUSCULAR | Status: AC
  Filled 2014-12-15: qty 1

## 2014-12-15 MED ORDER — MORPHINE SULFATE (PF) 10 MG/ML IV SOLN
INTRAVENOUS | Status: AC
  Filled 2014-12-15: qty 1

## 2014-12-15 MED ORDER — LEVOFLOXACIN 500 MG PO TABS: 500.0000 mg | ORAL_TABLET | Freq: Every day | ORAL | Status: DC

## 2014-12-15 MED ORDER — LEVOFLOXACIN 500 MG PO TABS
ORAL_TABLET | ORAL | Status: AC
  Filled 2014-12-15: qty 1

## 2014-12-15 NOTE — OR Nursing (Signed)
Redness on left flank.  No break in the skin

## 2014-12-15 NOTE — Discharge Instructions (Addendum)
Dietary Guidelines to Help Prevent Kidney Stones °Your risk of kidney stones can be decreased by adjusting the foods you eat. The most important thing you can do is drink enough fluid. You should drink enough fluid to keep your urine clear or pale yellow. The following guidelines provide specific information for the type of kidney stone you have had. °GUIDELINES ACCORDING TO TYPE OF KIDNEY STONE °Calcium Oxalate Kidney Stones °· Reduce the amount of salt you eat. Foods that have a lot of salt cause your body to release excess calcium into your urine. The excess calcium can combine with a substance called oxalate to form kidney stones. °· Reduce the amount of animal protein you eat if the amount you eat is excessive. Animal protein causes your body to release excess calcium into your urine. Ask your dietitian how much protein from animal sources you should be eating. °· Avoid foods that are high in oxalates. If you take vitamins, they should have less than 500 mg of vitamin C. Your body turns vitamin C into oxalates. You do not need to avoid fruits and vegetables high in vitamin C. °Calcium Phosphate Kidney Stones °· Reduce the amount of salt you eat to help prevent the release of excess calcium into your urine. °· Reduce the amount of animal protein you eat if the amount you eat is excessive. Animal protein causes your body to release excess calcium into your urine. Ask your dietitian how much protein from animal sources you should be eating. °· Get enough calcium from food or take a calcium supplement (ask your dietitian for recommendations). Food sources of calcium that do not increase your risk of kidney stones include: °· Broccoli. °· Dairy products, such as cheese and yogurt. °· Pudding. °Uric Acid Kidney Stones °· Do not have more than 6 oz of animal protein per day. °FOOD SOURCES °Animal Protein Sources °· Meat (all types). °· Poultry. °· Eggs. °· Fish, seafood. °Foods High in Salt °· Salt seasonings. °· Soy  sauce. °· Teriyaki sauce. °· Cured and processed meats. °· Salted crackers and snack foods. °· Fast food. °· Canned soups and most canned foods. °Foods High in Oxalates °· Grains: °· Amaranth. °· Barley. °· Grits. °· Wheat germ. °· Bran. °· Buckwheat flour. °· All bran cereals. °· Pretzels. °· Whole wheat bread. °· Vegetables: °· Beans (wax). °· Beets and beet greens. °· Collard greens. °· Eggplant. °· Escarole. °· Leeks. °· Okra. °· Parsley. °· Rutabagas. °· Spinach. °· Swiss chard. °· Tomato paste. °· Fried potatoes. °· Sweet potatoes. °· Fruits: °· Red currants. °· Figs. °· Kiwi. °· Rhubarb. °· Meat and Other Protein Sources: °· Beans (dried). °· Soy burgers and other soybean products. °· Miso. °· Nuts (peanuts, almonds, pecans, cashews, hazelnuts). °· Nut butters. °· Sesame seeds and tahini (paste made of sesame seeds). °· Poppy seeds. °· Beverages: °· Chocolate drink mixes. °· Soy milk. °· Instant iced tea. °· Juices made from high-oxalate fruits or vegetables. °· Other: °· Carob. °· Chocolate. °· Fruitcake. °· Marmalades. °Document Released: 07/20/2010 Document Revised: 03/30/2013 Document Reviewed: 02/19/2013 °ExitCare® Patient Information ©2015 [employer]. This information is not intended to replace advice given to you by your health care provider. Make sure you discuss any questions you have with your health care provider. ° °Lithotripsy for Kidney Stones °Lithotripsy is a treatment that can sometimes help eliminate kidney stones and pain that they cause. A form of lithotripsy, also known as extracorporeal shock wave lithotripsy, is a nonsurgical procedure that   helps your body rid itself of the kidney stone when it is too big to pass on its own. Extracorporeal shock wave lithotripsy is a method of crushing a kidney stone with shock waves. These shock waves pass through your body and are focused on your stone. They cause the kidney stones to crumble while still in the urinary tract. It is then easier for  the smaller pieces of stone to pass in the urine. °Lithotripsy usually takes about an hour. It is done in a hospital, a lithotripsy center, or a mobile unit. It usually does not require an overnight stay. Your health care provider will instruct you on preparation for the procedure. Your health care provider will tell you what to expect afterward. °LET YOUR HEALTH CARE PROVIDER KNOW ABOUT: °· Any allergies you have. °· All medicines you are taking, including vitamins, herbs, eye drops, creams, and over-the-counter medicines. °· Previous problems you or members of your family have had with the use of anesthetics. °· Any blood disorders you have. °· Previous surgeries you have had. °· Medical conditions you have. °RISKS AND COMPLICATIONS °Generally, lithotripsy for kidney stones is a safe procedure. However, as with any procedure, complications can occur. Possible complications include: °· Infection. °· Bleeding of the kidney. °· Bruising of the kidney or skin. °· Obstruction of the ureter. °· Failure of the stone to fragment. °BEFORE THE PROCEDURE °· Do not eat or drink for 6-8 hours prior to the procedure. You may, however, take the medications with a sip of water that your physician instructs you to take °· Do not take aspirin or aspirin-containing products for 7 days prior to your procedure °· Do not take nonsteroidal anti-inflammatory products for 7 days prior to your procedure °PROCEDURE °A stent (flexible tube with holes) may be placed in your ureter. The ureter is the tube that transports the urine from the kidneys to the bladder. Your health care provider may place a stent before the procedure. This will help keep urine flowing from the kidney if the fragments of the stone block the ureter. You may have an IV tube placed in one of your veins to give you fluids and medicines. These medicines may help you relax or make you sleep. During the procedure, you will lie comfortably on a fluid-filled cushion or in a  warm-water bath. After an X-ray or ultrasound exam to locate your stone, shock waves are aimed at the stone. If you are awake, you may feel a tapping sensation as the shock waves pass through your body. If large stone particles remain after treatment, a second procedure may be necessary at a later date. °For comfort during the test: °· Relax as much as possible. °· Try to remain still as much as possible. °· Try to follow instructions to speed up the test. °· Let your health care provider know if you are uncomfortable, anxious, or in pain. °AFTER THE PROCEDURE  °After surgery, you will be taken to the recovery area. A nurse will watch and check your progress. Once you're awake, stable, and taking fluids well, you will be allowed to go home as long as there are no problems. You will also be allowed to pass your urine before discharge. You may be given antibiotics to help prevent infection. You may also be prescribed pain medicine if needed. In a week or two, your health care provider may remove your stent, if you have one. You may first have an X-ray exam to check on how successful the fragmentation   of your stone has been and how much of the stone has passed. Your health care provider will check to see whether or not stone particles remain. °SEEK IMMEDIATE MEDICAL CARE IF: °· You develop a fever or shaking chills. °· Your pain is not relieved by medicine. °· You feel sick to your stomach (nauseated) and you vomit. °· You develop heavy bleeding. °· You have difficulty urinating. °· You start to pass your stent from your penis. °Document Released: 03/22/2000 Document Revised: 01/13/2013 Document Reviewed: 10/08/2012 °ExitCare® Patient Information ©2015 [employer]. This information is not intended to replace advice given to you by your health care provider. Make sure you discuss any questions you have with your health care provider. ° °Kidney Stones °Kidney stones (urolithiasis) are deposits that form inside your  kidneys. The intense pain is caused by the stone moving through the urinary tract. When the stone moves, the ureter goes into spasm around the stone. The stone is usually passed in the urine.  °CAUSES  °· A disorder that makes certain neck glands produce too much parathyroid hormone (primary hyperparathyroidism). °· A buildup of uric acid crystals, similar to gout in your joints. °· Narrowing (stricture) of the ureter. °· A kidney obstruction present at birth (congenital obstruction). °· Previous surgery on the kidney or ureters. °· Numerous kidney infections. °SYMPTOMS  °· Feeling sick to your stomach (nauseous). °· Throwing up (vomiting). °· Blood in the urine (hematuria). °· Pain that usually spreads (radiates) to the groin. °· Frequency or urgency of urination. °DIAGNOSIS  °· Taking a history and physical exam. °· Blood or urine tests. °· CT scan. °· Occasionally, an examination of the inside of the urinary bladder (cystoscopy) is performed. °TREATMENT  °· Observation. °· Increasing your fluid intake. °· Extracorporeal shock wave lithotripsy--This is a noninvasive procedure that uses shock waves to break up kidney stones. °· Surgery may be needed if you have severe pain or persistent obstruction. There are various surgical procedures. Most of the procedures are performed with the use of small instruments. Only small incisions are needed to accommodate these instruments, so recovery time is minimized. °The size, location, and chemical composition are all important variables that will determine the proper choice of action for you. Talk to your health care provider to better understand your situation so that you will minimize the risk of injury to yourself and your kidney.  °HOME CARE INSTRUCTIONS  °· Drink enough water and fluids to keep your urine clear or pale yellow. This will help you to pass the stone or stone fragments. °· Strain all urine through the provided strainer. Keep all particulate matter and stones  for your health care provider to see. The stone causing the pain may be as small as a grain of salt. It is very important to use the strainer each and every time you pass your urine. The collection of your stone will allow your health care provider to analyze it and verify that a stone has actually passed. The stone analysis will often identify what you can do to reduce the incidence of recurrences. °· Only take over-the-counter or prescription medicines for pain, discomfort, or fever as directed by your health care provider. °· Make a follow-up appointment with your health care provider as directed. °· Get follow-up X-rays if required. The absence of pain does not always mean that the stone has passed. It may have only stopped moving. If the urine remains completely obstructed, it can cause loss of kidney function or even   complete destruction of the kidney. It is your responsibility to make sure X-rays and follow-ups are completed. Ultrasounds of the kidney can show blockages and the status of the kidney. Ultrasounds are not associated with any radiation and can be performed easily in a matter of minutes. SEEK MEDICAL CARE IF:  You experience pain that is progressive and unresponsive to any pain medicine you have been prescribed. SEEK IMMEDIATE MEDICAL CARE IF:   Pain cannot be controlled with the prescribed medicine.  You have a fever or shaking chills.  The severity or intensity of pain increases over 18 hours and is not relieved by pain medicine.  You develop a new onset of abdominal pain.  You feel faint or pass out.  You are unable to urinate. MAKE SURE YOU:   Understand these instructions.  Will watch your condition.  Will get help right away if you are not doing well or get worse. Document Released: 03/25/2005 Document Revised: 11/25/2012 Document Reviewed: 08/26/2012 [facility] Patient Information 2015 [place], [place]. This information is not intended to replace advice given to you by  your health care provider. Make sure you discuss any questions you have with your health care provider. AMBULATORY SURGERY  DISCHARGE INSTRUCTIONS   1) The drugs that you were given will stay in your system until tomorrow so for the next 24 hours you should not:  A) Drive an automobile B) Make any legal decisions C) Drink any alcoholic beverage   2) You may resume regular meals tomorrow.  Today it is better to start with liquids and gradually work up to solid foods.  You may eat anything you prefer, but it is better to start with liquids, then soup and crackers, and gradually work up to solid foods.   3) Please notify your doctor immediately if you have any unusual bleeding, trouble breathing, redness and pain at the surgery site, drainage, fever, or pain not relieved by medication.    4) Additional Instructions: See Dr. Amada Jupiter instructions as reviewed and post op ESWL as reviewed at office.  Please contact your physician with any problems or Same Day Surgery at [phone], Monday through Friday 6 am to 4 pm, or [place] at [facility] number at [phone].

## 2014-12-16 ENCOUNTER — Encounter: Payer: Self-pay | Admitting: Urology

## 2014-12-20 ENCOUNTER — Encounter: Payer: Self-pay | Admitting: Internal Medicine

## 2014-12-21 NOTE — Telephone Encounter (Signed)
Records received & abstracted 

## 2015-01-23 ENCOUNTER — Ambulatory Visit: Payer: Self-pay | Admitting: Physician Assistant

## 2015-01-23 VITALS — BP 130/82 | HR 86 | Temp 98.1°F

## 2015-01-23 DIAGNOSIS — N390 Urinary tract infection, site not specified: Secondary | ICD-10-CM

## 2015-01-23 LAB — POCT URINALYSIS DIPSTICK
Bilirubin, UA: NEGATIVE
Glucose, UA: NEGATIVE
Ketones, UA: NEGATIVE
Nitrite, UA: NEGATIVE
Protein, UA: NEGATIVE
Spec Grav, UA: 1.015
Urobilinogen, UA: 1
pH, UA: 6.5

## 2015-01-23 NOTE — Progress Notes (Signed)
  Low Abdominal pain Subjective:    [name], female    DOB: 08/10/1950, 64 y.o.   MRN: [id number]  HPI Patient c/o low abdominal pain for 3 days.  Denies N/V/D. Denies flank pain or vaginal discharge.    Review of Systems Epigastric pain.     Objective:   Physical Exam No acute distress. Obese abdomen, no distension.  Normoactive bowel sound. Mil guarding supra pubic area.  Dip UA reveal 3+ Leukocytes.       Assessment & Plan:  UTI and GERD. Advised to re-start Levaquin and Zantac. Urine culture pending.  Follow up in 10 days.

## 2015-01-23 NOTE — Addendum Note (Signed)
Addended by: Catha BrowEACON, Jiro Kiester T on: 01/23/2015 12:43 PM   Modules accepted: Orders

## 2015-01-25 LAB — URINE CULTURE

## 2015-03-07 ENCOUNTER — Telehealth: Payer: Self-pay | Admitting: Internal Medicine

## 2015-03-07 NOTE — Telephone Encounter (Signed)
Left msg to call office to schedule flu shot/msn °

## 2015-03-24 ENCOUNTER — Ambulatory Visit
Admission: RE | Admit: 2015-03-24 | Discharge: 2015-03-24 | Disposition: A | Discharge status: Home / Self Care | Payer: PRIVATE HEALTH INSURANCE | Source: Ambulatory Visit | Attending: Internal Medicine | Admitting: Internal Medicine

## 2015-03-24 ENCOUNTER — Ambulatory Visit: Payer: PRIVATE HEALTH INSURANCE

## 2015-03-24 DIAGNOSIS — Z1231 Encounter for screening mammogram for malignant neoplasm of breast: Secondary | ICD-10-CM | POA: Diagnosis not present

## 2015-03-24 DIAGNOSIS — Z1239 Encounter for other screening for malignant neoplasm of breast: Secondary | ICD-10-CM

## 2015-03-26 LAB — HEPATIC FUNCTION PANEL
ALT: 17 U/L (ref 7–35)
AST: 18 U/L (ref 13–35)
Alkaline Phosphatase: 67 U/L (ref 25–125)
Bilirubin, Total: 0.8 mg/dL

## 2015-03-26 LAB — BASIC METABOLIC PANEL
BUN: 7 mg/dL (ref 4–21)
Creatinine: 0.7 mg/dL (ref 0.5–1.1)
Glucose: 141 mg/dL
Potassium: 4 mmol/L (ref 3.4–5.3)
Sodium: 139 mmol/L (ref 137–147)

## 2015-03-26 LAB — CBC AND DIFFERENTIAL
HCT: 40 % (ref 36–46)
Hemoglobin: 13 g/dL (ref 12.0–16.0)
Platelets: 282 10*3/uL (ref 150–399)

## 2015-03-28 ENCOUNTER — Telehealth: Payer: Self-pay | Admitting: Internal Medicine

## 2015-03-28 NOTE — Telephone Encounter (Signed)
rx for labs written and placed in your box.

## 2015-03-28 NOTE — Telephone Encounter (Signed)
Orders for pt lab work needs to be faxed to [facility] at [facility]. [phone]

## 2015-03-29 ENCOUNTER — Other Ambulatory Visit: Payer: Self-pay

## 2015-03-29 DIAGNOSIS — Z0000 Encounter for general adult medical examination without abnormal findings: Secondary | ICD-10-CM

## 2015-03-29 NOTE — Progress Notes (Signed)
Patient came in to have blood drawn for her Physician, Dr. Dale Durhamharlene Scott at [facility].  Blood was drawn from the right arm without any incident.  Pt wants results sent to Dr. Lorin PicketScott when they are finalized.

## 2015-03-29 NOTE — Telephone Encounter (Signed)
Rx faxed

## 2015-03-30 ENCOUNTER — Ambulatory Visit (INDEPENDENT_AMBULATORY_CARE_PROVIDER_SITE_OTHER): Payer: PRIVATE HEALTH INSURANCE | Admitting: General Surgery

## 2015-03-30 ENCOUNTER — Encounter: Payer: Self-pay | Admitting: Internal Medicine

## 2015-03-30 ENCOUNTER — Ambulatory Visit (INDEPENDENT_AMBULATORY_CARE_PROVIDER_SITE_OTHER): Payer: PRIVATE HEALTH INSURANCE | Admitting: Internal Medicine

## 2015-03-30 ENCOUNTER — Encounter: Payer: Self-pay | Admitting: General Surgery

## 2015-03-30 ENCOUNTER — Ambulatory Visit
Admission: RE | Admit: 2015-03-30 | Discharge: 2015-03-30 | Disposition: A | Discharge status: Home / Self Care | Payer: PRIVATE HEALTH INSURANCE | Source: Ambulatory Visit | Attending: Internal Medicine | Admitting: Internal Medicine

## 2015-03-30 VITALS — BP 128/68 | HR 80 | Resp 14 | Ht 66.0 in | Wt 224.0 lb

## 2015-03-30 VITALS — BP 110/70 | HR 92 | Temp 98.2°F | Resp 18 | Ht 65.0 in | Wt 224.0 lb

## 2015-03-30 DIAGNOSIS — R634 Abnormal weight loss: Secondary | ICD-10-CM | POA: Diagnosis not present

## 2015-03-30 DIAGNOSIS — K828 Other specified diseases of gallbladder: Secondary | ICD-10-CM | POA: Diagnosis not present

## 2015-03-30 DIAGNOSIS — N200 Calculus of kidney: Secondary | ICD-10-CM | POA: Insufficient documentation

## 2015-03-30 DIAGNOSIS — R1084 Generalized abdominal pain: Secondary | ICD-10-CM

## 2015-03-30 DIAGNOSIS — K810 Acute cholecystitis: Secondary | ICD-10-CM | POA: Diagnosis not present

## 2015-03-30 DIAGNOSIS — E7800 Pure hypercholesterolemia, unspecified: Secondary | ICD-10-CM

## 2015-03-30 DIAGNOSIS — E559 Vitamin D deficiency, unspecified: Secondary | ICD-10-CM

## 2015-03-30 DIAGNOSIS — I809 Phlebitis and thrombophlebitis of unspecified site: Secondary | ICD-10-CM | POA: Diagnosis not present

## 2015-03-30 LAB — CMP12+LP+TP+TSH+6AC+CBC/D/PLT
ALT: 15 IU/L (ref 0–32)
AST: 16 IU/L (ref 0–40)
Albumin/Globulin Ratio: 1.1 (ref 1.1–2.5)
Albumin: 3.9 g/dL (ref 3.6–4.8)
Alkaline Phosphatase: 76 IU/L (ref 39–117)
BUN/Creatinine Ratio: 15 (ref 11–26)
BUN: 14 mg/dL (ref 8–27)
Basophils Absolute: 0 10*3/uL (ref 0.0–0.2)
Basos: 0 %
Bilirubin Total: 1 mg/dL (ref 0.0–1.2)
Calcium: 9.3 mg/dL (ref 8.7–10.3)
Chloride: 98 mmol/L (ref 96–106)
Chol/HDL Ratio: 4.2 ratio units (ref 0.0–4.4)
Cholesterol, Total: 174 mg/dL (ref 100–199)
Creatinine, Ser: 0.95 mg/dL (ref 0.57–1.00)
EOS (ABSOLUTE): 0.1 10*3/uL (ref 0.0–0.4)
Eos: 1 %
Estimated CHD Risk: 1 times avg. (ref 0.0–1.0)
Free Thyroxine Index: 1.9 (ref 1.2–4.9)
GFR calc Af Amer: 74 mL/min/{1.73_m2} (ref >59)
GFR calc non Af Amer: 64 mL/min/{1.73_m2} (ref >59)
GGT: 14 IU/L (ref 0–60)
Globulin, Total: 3.4 g/dL (ref 1.5–4.5)
Glucose: 99 mg/dL (ref 65–99)
HDL: 41 mg/dL (ref >39)
Hematocrit: 41.1 % (ref 34.0–46.6)
Hemoglobin: 13.7 g/dL (ref 11.1–15.9)
Immature Grans (Abs): 0 10*3/uL (ref 0.0–0.1)
Immature Granulocytes: 0 %
Iron: 16 ug/dL — ABNORMAL LOW (ref 27–139)
LDH: 200 IU/L (ref 119–226)
LDL Calculated: 119 mg/dL — ABNORMAL HIGH (ref 0–99)
Lymphocytes Absolute: 1.8 10*3/uL (ref 0.7–3.1)
Lymphs: 20 %
MCH: 30 pg (ref 26.6–33.0)
MCHC: 33.3 g/dL (ref 31.5–35.7)
MCV: 90 fL (ref 79–97)
Monocytes Absolute: 0.8 10*3/uL (ref 0.1–0.9)
Monocytes: 8 %
Neutrophils Absolute: 6.6 10*3/uL (ref 1.4–7.0)
Neutrophils: 71 %
Phosphorus: 3.7 mg/dL (ref 2.5–4.5)
Platelets: 299 10*3/uL (ref 150–379)
Potassium: 4.1 mmol/L (ref 3.5–5.2)
RBC: 4.56 x10E6/uL (ref 3.77–5.28)
RDW: 13.3 % (ref 12.3–15.4)
Sodium: 139 mmol/L (ref 134–144)
T3 Uptake Ratio: 28 % (ref 24–39)
T4, Total: 6.8 ug/dL (ref 4.5–12.0)
TSH: 2.39 u[IU]/mL (ref 0.450–4.500)
Total Protein: 7.3 g/dL (ref 6.0–8.5)
Triglycerides: 69 mg/dL (ref 0–149)
Uric Acid: 4.6 mg/dL (ref 2.5–7.1)
VLDL Cholesterol Cal: 14 mg/dL (ref 5–40)
WBC: 9.4 10*3/uL (ref 3.4–10.8)

## 2015-03-30 LAB — MICROSCOPIC EXAMINATION
Casts: NONE SEEN /lpf
Epithelial Cells (non renal): >10 /hpf — AB (ref 0–10)
WBC, UA: >30 /hpf — AB (ref 0–?)

## 2015-03-30 LAB — URINALYSIS, ROUTINE W REFLEX MICROSCOPIC
Bilirubin, UA: NEGATIVE
Glucose, UA: NEGATIVE
Nitrite, UA: NEGATIVE
Specific Gravity, UA: 1.023 (ref 1.005–1.030)
Urobilinogen, Ur: 1 mg/dL (ref 0.2–1.0)
pH, UA: 6 (ref 5.0–7.5)

## 2015-03-30 LAB — VITAMIN D 25 HYDROXY (VIT D DEFICIENCY, FRACTURES): Vit D, 25-Hydroxy: 10.5 ng/mL — ABNORMAL LOW (ref 30.0–100.0)

## 2015-03-30 LAB — HGB A1C W/O EAG: Hgb A1c MFr Bld: 5.9 % — ABNORMAL HIGH (ref 4.8–5.6)

## 2015-03-30 MED ORDER — IOHEXOL 300 MG/ML  SOLN
100.0000 mL | Freq: Once | INTRAMUSCULAR | Status: AC | PRN
  Administered 2015-03-30: 100 mL via INTRAVENOUS

## 2015-03-30 MED ORDER — ERGOCALCIFEROL 1.25 MG (50000 UT) PO CAPS: 50000.0000 [IU] | ORAL_CAPSULE | ORAL | Status: DC

## 2015-03-30 NOTE — Progress Notes (Signed)
[name], female   DOB: 07/20/1950, 64 y.o.   MRN: [id number]  Chief Complaint  Patient presents with  . Abdominal Pain    HPI Nicole Hardy is a 64 y.o. female.  .here today for evaluation of her gall bladder. She had an episode of upper abdominal pain, nausea and vomiting in February, March, and in October of this year. This past Sunday was atypical in that the pain was in the lower abdomen.  Vomiting made her feel better. She has had kidney stones pain with lithotripsy as well.  She then had a CT scan today that showed gallbladder wall thickening and pericholecystic inflammatory changes.. She states she is has been steadily getting better over the last 3 days. She thinks she had eaten meatloaf in February, March it was cheeseburger that may have triggered the pain. She had eaten shrimp and hushpuppies on Saturday before the pain on this past Sunday. She admits to having a little more "gas" as well. She had lost 24 pounds in 4 months (August-December) by modifying her diet in both content and volume. She is here today with her husband of 44 years.  I personally reviewed the patient's history. HPI  Past Medical History  Diagnosis Date  . Hypercholesterolemia   . Vitamin D deficiency   . Nephrolithiasis   . Degenerative disc disease, cervical     cervical  . Meralgia paraesthetica   . Neurodermatitis   . Osteopenia   . Migraines   . Obesity     Past Surgical History  Procedure Laterality Date  . Laparoscopic surgery  1990    nephrolithiasis (Dr Orson SlickHarmon)  . Lithotripsy  2010, 1980's  . Extracorporeal shock wave lithotripsy Right 11/17/2014    Procedure: EXTRACORPOREAL SHOCK WAVE LITHOTRIPSY (ESWL);  Surgeon: Orson ApeMichael R Wolff, MD;  Location: [facility] ORS;  Service: Urology;  Laterality: Right;  . Extracorporeal shock wave lithotripsy Left 12/15/2014    Procedure: EXTRACORPOREAL SHOCK WAVE LITHOTRIPSY (ESWL);  Surgeon: Orson ApeMichael R Wolff, MD;  Location: [facility] ORS;  Service: Urology;   Laterality: Left;  . [place]ess[place]  2003, 2016    Family History  Problem Relation Age of Onset  . Hypertension Father   . Hypertension Mother   . Asthma Sister   . Hypertension Sister   . Diabetes Sister   . Breast cancer Sister [id number]  . Rectal cancer Sister     Social History Social History  Substance Use Topics  . Smoking status: Never Smoker   . Smokeless tobacco: Never Used  . Alcohol Use: No    Allergies  Allergen Reactions  . Metronidazole Other (See Comments)    Causes thrush in mouth    Current Outpatient Prescriptions  Medication Sig Dispense Refill  . ergocalciferol (VITAMIN D2) 50000 UNITS capsule Take 1 capsule (50,000 Units total) by mouth once a week. 4 capsule 3  . HYDROcodone-acetaminophen (NORCO/VICODIN) 5-325 MG per tablet Take 1 tablet by mouth every 6 (six) hours as needed for moderate pain.    Marland Kitchen. nystatin cream (MYCOSTATIN) Apply 1 application topically 2 (two) times daily. (Patient taking differently: Apply 1 application topically 2 (two) times daily as needed. ) 30 g 1  . ondansetron (ZOFRAN ODT) 8 MG disintegrating tablet Take 1 tablet (8 mg total) by mouth every 6 (six) hours as needed for nausea or vomiting. 10 tablet 3   No current facility-administered medications for this visit.    Review of Systems Review of Systems  Constitutional: Negative.   Respiratory: Negative.  Cardiovascular: Negative.   Gastrointestinal: Positive for nausea, vomiting, abdominal pain and diarrhea. Negative for constipation.    Blood pressure 128/68, pulse 80, resp. rate 14, height  (1.676 m), weight 224 lb (101.606 kg), last menstrual period 03/26/2003.  Physical Exam Physical Exam  Constitutional: She is oriented to person, place, and time. She appears well-developed and well-nourished.  HENT:  Mouth/Throat: Oropharynx is clear and moist.  Eyes: Conjunctivae are normal. No scleral icterus.  Neck: Neck supple.  Cardiovascular: Normal rate, regular rhythm and  normal heart sounds.   Pulmonary/Chest: Effort normal and breath sounds normal.  Abdominal: Soft. Normal appearance and bowel sounds are normal. She exhibits no mass. There is no tenderness.  Lymphadenopathy:    She has no cervical adenopathy.  Neurological: She is alert and oriented to person, place, and time.  Skin: Skin is warm and dry.  Psychiatric: Her behavior is normal.    Data Reviewed CT scan of the abdomen and pelvis completed today was independently reviewed. Inflammatory changes around the gallbladder noted. Mild gallbladder distention. Prominence of the common bile duct through the head of the pancreas. Laboratory studies completed on Sunday, December 18 at the walk-in clinic as well as elective studies for today's earlier routine exam with Dr. Lorin Picket reviewed. Liver function studies are normal. White blood cell count was normal earlier in the week with a left shift  Assessment    History compatible with chronic cholecystitis and cholelithiasis, CT scan suggesting a more acute process.    Plan    The patient's pain with the most recent episode was in the lower abdomen although the duration and associated symptoms (nausea, vomiting, diarrhea) were similar to those episodes earlier in the year.  She will likely benefit from elective cholecystectomy. With the Christmas just 2 days away, and her study improvement over the last 3 days, we would not be intervening during the "early acute inflammatory time" and more likely a patient would have a more difficult procedure and increased risk of an open procedure.  She is aware to avoid greasy foods and salads, and we will get an ultrasound week to confirm that gallstones are present. She may have had an episode of acalculous cholecystitis and if that was the case, cholecystectomy would not be indicated. She was given a prescription for hydrocodone to use for pain if needed. Should she have recurrent pain she has been encouraged to call for  prompt reassessment.     Schedule ultrasound gall bladder next week. Follow up in 2 weeks. RX for Norco 5/325mg  to have if needed.  PCP:  Dale Moca This information has been scribed by Dorathy Daft RNBC.    Earline Mayotte 03/31/2015, 11:14 AM

## 2015-03-30 NOTE — Progress Notes (Signed)
[name], female   DOB: 03/09/1951, 64 y.o.   MRN: [id number]   Subjective:    [name], female    DOB: 08/28/1950, 64 y.o.   MRN: [id number]  HPI  Patient with past history of hypercholesterolemia and vitamin D deficiency.  She comes in today to follow up on these issues.  She reports she had increased abdominal discomfort, vomiting, nausea a some diarrhea starting 5 days ago.  She was see at acute care.  Reviewed labs.  She is better.  Pain is better.  Still with some abdominal discomfort.  No nausea, vomiting or diarrhea now.  Feels better.  She has had some intermittent episodes.  States had an episode in 05/2014 and in 06/2014.  She also has recently been evaluated by gyn for uterine prolapse.  Had her pessary changed.  Was instructed to use premarin cream.  We discussed estrogen cream and risk/side effects.  She has also seen urology for kidney stones.  States other than her bowels, she feels she is doing relatively well.  We discussed her weight loss.  She has tried to adjust her diet.  No cardiac symptoms with increased activity or exertion.  No sob.  Question of some occasional acid reflux.     Past Medical History  Diagnosis Date  . Hypercholesterolemia   . Vitamin D deficiency   . Nephrolithiasis   . Degenerative disc disease, cervical     cervical  . Meralgia paraesthetica   . Neurodermatitis   . Osteopenia   . Migraines   . Obesity    Past Surgical History  Procedure Laterality Date  . Laparoscopic surgery  1990    nephrolithiasis (Dr Orson SlickHarmon)  . Lithotripsy  2010, 1980's  . Extracorporeal shock wave lithotripsy Right 11/17/2014    Procedure: EXTRACORPOREAL SHOCK WAVE LITHOTRIPSY (ESWL);  Surgeon: Orson ApeMichael R Wolff, MD;  Location: [facility];  Service: Urology;  Laterality: Right;  . Extracorporeal shock wave lithotripsy Left 12/15/2014    Procedure: EXTRACORPOREAL SHOCK WAVE LITHOTRIPSY (ESWL);  Surgeon: Orson ApeMichael R Wolff, MD;  Location: [facility] OR[facility];  Service:  Urology;  Laterality: Left;  . Pessary  2003, 2016   Family History  Problem Relation Age of Onset  . Hypertension Father   . Hypertension Mother   . Asthma Sister   . Hypertension Sister   . Diabetes Sister   . Breast cancer Sister [id number]  . Rectal cancer Sister    Social History   Social History  . Marital Status: Married    Spouse Name: N/A  . Number of Children: 1  . Years of Education: N/A   Social History Main Topics  . Smoking status: Never Smoker   . Smokeless tobacco: Never Used  . Alcohol Use: No  . Drug Use: No  . Sexual Activity: Not Asked   Other Topics Concern  . None   Social History Narrative    Outpatient Encounter Prescriptions as of 03/30/2015  Medication Sig  . HYDROcodone-acetaminophen (NORCO/VICODIN) 5-325 MG per tablet Take 1 tablet by mouth every 6 (six) hours as needed for moderate pain.  Marland Kitchen. nystatin cream (MYCOSTATIN) Apply 1 application topically 2 (two) times daily. (Patient taking differently: Apply 1 application topically 2 (two) times daily as needed. )  . ondansetron (ZOFRAN ODT) 8 MG disintegrating tablet Take 1 tablet (8 mg total) by mouth every 6 (six) hours as needed for nausea or vomiting.  . ergocalciferol (VITAMIN D2) 50000 UNITS capsule Take 1 capsule (50,000 Units total)  by mouth once a week.  . [DISCONTINUED] levofloxacin (LEVAQUIN) 500 MG tablet Take 1 tablet (500 mg total) by mouth daily. (Patient not taking: Reported on 01/23/2015)  . [DISCONTINUED] NUCYNTA 50 MG TABS tablet Take 1 tablet (50 mg total) by mouth every 6 (six) hours as needed for moderate pain. 1 TO 2 TABS Q 6 HOURS PRN PAIN (Patient not taking: Reported on 01/23/2015)  . [DISCONTINUED] promethazine (PHENERGAN) 25 MG suppository Place 1 suppository (25 mg total) rectally every 6 (six) hours as needed for nausea or vomiting. (Patient not taking: Reported on 01/23/2015)   No facility-administered encounter medications on file as of 03/30/2015.    Review of Systems    Constitutional:       Has adjusted her diet.  Has lost weight.    HENT: Negative for congestion and sinus pressure.   Respiratory: Negative for cough, chest tightness and shortness of breath.   Cardiovascular: Negative for chest pain, palpitations and leg swelling.  Gastrointestinal: Positive for abdominal pain.       Previous loose stool.  States had formed bowel movement recently.  No vomiting now.  Minimal abdominal discomfort.   Genitourinary: Negative for dysuria and difficulty urinating.  Musculoskeletal: Negative for myalgias, back pain and joint swelling.  Skin: Negative for color change and rash.  Neurological: Negative for dizziness, light-headedness and headaches.  Psychiatric/Behavioral: Negative for dysphoric mood and agitation.       Objective:    Physical Exam  Constitutional: She appears well-developed and well-nourished. No distress.  HENT:  Nose: Nose normal.  Mouth/Throat: Oropharynx is clear and moist.  Neck: Neck supple. No thyromegaly present.  Cardiovascular: Normal rate and regular rhythm.   Pulmonary/Chest: Breath sounds normal. No respiratory distress. She has no wheezes.  Abdominal: Soft. Bowel sounds are normal.  Minimal tenderness to palpation over the right mid abdomen.    Musculoskeletal: She exhibits no edema or tenderness.  Lymphadenopathy:    She has no cervical adenopathy.  Skin: No rash noted. No erythema.  Psychiatric: She has a normal mood and affect. Her behavior is normal.    BP 110/70 mmHg  Pulse 92  Temp(Src) 98.2 F (36.8 C) (Oral)  Resp 18  Ht  (1.651 m)  Wt 224 lb (101.606 kg)  BMI 37.28 kg/m2  SpO2 95%  LMP 03/26/2003 Wt Readings from Last 3 Encounters:  03/30/15 224 lb (101.606 kg)  03/30/15 224 lb (101.606 kg)  12/01/14 248 lb 8 oz (112.719 kg)     Lab Results  Component Value Date   WBC 9.4 03/29/2015   HGB 13.0 03/26/2015   HCT 41.1 03/29/2015   PLT 282 03/26/2015   GLUCOSE 99 03/29/2015   CHOL 174  03/29/2015   TRIG 69 03/29/2015   HDL 41 03/29/2015   LDLCALC 119* 03/29/2015   ALT 15 03/29/2015   AST 16 03/29/2015   NA 139 03/29/2015   K 4.1 03/29/2015   CL 98 03/29/2015   CREATININE 0.95 03/29/2015   BUN 14 03/29/2015   TSH 2.390 03/29/2015   HGBA1C 5.9* 03/29/2015    Mm Digital Screening Bilateral  03/24/2015  CLINICAL DATA:  Screening. EXAM: DIGITAL SCREENING BILATERAL MAMMOGRAM WITH CAD COMPARISON:  Previous exam(s). ACR Breast Density Category b: There are scattered areas of fibroglandular density. FINDINGS: There are no findings suspicious for malignancy. Images were processed with CAD. IMPRESSION: No mammographic evidence of malignancy. A result letter of this screening mammogram will be mailed directly to the patient. RECOMMENDATION: Screening mammogram in  one year. (Code:SM-B-01Y) BI-RADS CATEGORY  1: Negative. Electronically Signed   By: Hulan Saas M.D.   On: 03/24/2015 11:45       Assessment & Plan:   Problem List Items Addressed This Visit    Abdominal pain - Primary    Abdominal pain and weight loss as outlined.  She has adjusted her diet, but not sure this accounts for the amount of weight loss.  Persistent intermittent episodes of pain and nausea/vomiting, etc.  Possible gallbladder etiology.  Will check CT scan abdomen and pelvis, given the amount of weight loss, persistent symptoms, etc.  Add zantac daily for possible acid reflux.  Of note, had colonoscopy 07/23/12 - internal hemorrhoids and diverticulosis.        Relevant Orders   CT Abdomen Pelvis W Contrast   CT Abdomen Pelvis W Contrast (Completed)   Hypercholesterolemia    Low cholesterol diet and exercise.  Follow lipid panel.   Lab Results  Component Value Date   CHOL 174 03/29/2015   HDL 41 03/29/2015   LDLCALC 119* 03/29/2015   TRIG 69 03/29/2015   CHOLHDL 4.2 03/29/2015        Nephrolithiasis    Seeing Dr Evelene Croon.  S/p lithotripsy.        Thrombophlebitis    Has been followed by  [facility].  We discussed risk of estrogen therapy.        Vitamin D deficiency    Vitamin d level just checked and was 10.  Restart ergocalciferol.         Other Visit Diagnoses    Loss of weight        Relevant Orders    CT Abdomen Pelvis W Contrast    CT Abdomen Pelvis W Contrast (Completed)      I spent 25 minutes with the patient and more than 50% of the time was spent in consultation regarding the above.     Dale Caribou, MD

## 2015-03-30 NOTE — Patient Instructions (Addendum)
The patient is aware to call back for any questions or concerns. Schedule ultrasound gall bladder next week.

## 2015-03-30 NOTE — Progress Notes (Signed)
Pre-visit discussion using our clinic review tool. No additional management support is needed unless otherwise documented below in the visit note.  

## 2015-03-31 ENCOUNTER — Encounter: Payer: Self-pay | Admitting: Internal Medicine

## 2015-03-31 ENCOUNTER — Telehealth: Payer: Self-pay

## 2015-03-31 DIAGNOSIS — K810 Acute cholecystitis: Secondary | ICD-10-CM | POA: Insufficient documentation

## 2015-03-31 NOTE — Assessment & Plan Note (Signed)
Abdominal pain and weight loss as outlined.  She has adjusted her diet, but not sure this accounts for the amount of weight loss.  Persistent intermittent episodes of pain and nausea/vomiting, etc.  Possible gallbladder etiology.  Will check CT scan abdomen and pelvis, given the amount of weight loss, persistent symptoms, etc.  Add zantac daily for possible acid reflux.  Of note, had colonoscopy 07/23/12 - internal hemorrhoids and diverticulosis.

## 2015-03-31 NOTE — Assessment & Plan Note (Signed)
Has been followed by [facility].  We discussed risk of estrogen therapy.

## 2015-03-31 NOTE — Assessment & Plan Note (Signed)
Vitamin d level just checked and was 10.  Restart ergocalciferol.

## 2015-03-31 NOTE — Telephone Encounter (Signed)
-----   Message from Nicholes MangoMichele J Bailey, [place] sent at 03/30/2015  6:17 PM EST ----- Please arrange for gallbladder u/s for this patient for mid-late next week. Order in AmestiEpic. Please call patient with info and [facility]. See paperwork on your desk. Thanks.

## 2015-03-31 NOTE — Assessment & Plan Note (Signed)
Low cholesterol diet and exercise.  Follow lipid panel.   Lab Results  Component Value Date   CHOL 174 03/29/2015   HDL 41 03/29/2015   LDLCALC 119* 03/29/2015   TRIG 69 03/29/2015   CHOLHDL 4.2 03/29/2015

## 2015-03-31 NOTE — Assessment & Plan Note (Signed)
Seeing Dr Evelene CroonWolff.  S/p lithotripsy.

## 2015-03-31 NOTE — Telephone Encounter (Signed)
Notified patient of ultrasound appointment. [facility] on 04/04/15 at 9:30 am. She is to arrive by 9:00 am and have nothing to ear or drink for 6 hours prior. Patient is aware of date, time, and instructions.

## 2015-04-04 ENCOUNTER — Ambulatory Visit
Admission: RE | Admit: 2015-04-04 | Discharge: 2015-04-04 | Disposition: A | Discharge status: Home / Self Care | Payer: PRIVATE HEALTH INSURANCE | Source: Ambulatory Visit | Attending: General Surgery | Admitting: General Surgery

## 2015-04-04 ENCOUNTER — Ambulatory Visit: Payer: PRIVATE HEALTH INSURANCE

## 2015-04-04 DIAGNOSIS — K810 Acute cholecystitis: Secondary | ICD-10-CM | POA: Diagnosis present

## 2015-04-04 DIAGNOSIS — K8020 Calculus of gallbladder without cholecystitis without obstruction: Secondary | ICD-10-CM | POA: Insufficient documentation

## 2015-04-11 NOTE — Progress Notes (Signed)
Patient called today and requested that I mail a copy of her lab results to her home address.

## 2015-04-13 ENCOUNTER — Encounter: Payer: Self-pay | Admitting: General Surgery

## 2015-04-13 ENCOUNTER — Ambulatory Visit (INDEPENDENT_AMBULATORY_CARE_PROVIDER_SITE_OTHER): Payer: Managed Care, Other (non HMO) | Admitting: General Surgery

## 2015-04-13 VITALS — BP 132/78 | HR 76 | Resp 12 | Ht 66.0 in | Wt 225.0 lb

## 2015-04-13 DIAGNOSIS — K8010 Calculus of gallbladder with chronic cholecystitis without obstruction: Secondary | ICD-10-CM | POA: Diagnosis not present

## 2015-04-13 DIAGNOSIS — K810 Acute cholecystitis: Secondary | ICD-10-CM | POA: Diagnosis not present

## 2015-04-13 NOTE — Patient Instructions (Addendum)
Laparoscopic Cholecystectomy Laparoscopic cholecystectomy is surgery to remove the gallbladder. The gallbladder is located in the upper right part of the abdomen, behind the liver. It is a storage sac for bile, which is produced in the liver. Bile aids in the digestion and absorption of fats. Cholecystectomy is often done for inflammation of the gallbladder (cholecystitis). This condition is usually caused by a buildup of gallstones (cholelithiasis) in the gallbladder. Gallstones can block the flow of bile, and that can result in inflammation and pain. In severe cases, emergency surgery may be required. If emergency surgery is not required, you will have time to prepare for the procedure. Laparoscopic surgery is an alternative to open surgery. Laparoscopic surgery has a shorter recovery time. Your common bile duct may also need to be examined during the procedure. If stones are found in the common bile duct, they may be removed. LET YOUR HEALTH CARE PROVIDER KNOW ABOUT:  Any allergies you have.  All medicines you are taking, including vitamins, herbs, eye drops, creams, and over-the-counter medicines.  Previous problems you or members of your family have had with the use of anesthetics.  Any blood disorders you have.  Previous surgeries you have had.  Any medical conditions you have. RISKS AND COMPLICATIONS Generally, this is a safe procedure. However, problems may occur, including:  Infection.  Bleeding.  Allergic reactions to medicines.  Damage to other structures or organs.  A stone remaining in the common bile duct.  A bile leak from the cyst duct that is clipped when your gallbladder is removed.  The need to convert to open surgery, which requires a larger incision in the abdomen. This may be necessary if your surgeon thinks that it is not safe to continue with a laparoscopic procedure. BEFORE THE PROCEDURE  Ask your health care provider about:  Changing or stopping your  regular medicines. This is especially important if you are taking diabetes medicines or blood thinners.  Taking medicines such as aspirin and ibuprofen. These medicines can thin your blood. Do not take these medicines before your procedure if your health care provider instructs you not to.  Follow instructions from your health care provider about eating or drinking restrictions.  Let your health care provider know if you develop a cold or an infection before surgery.  Plan to have someone take you home after the procedure.  Ask your health care provider how your surgical site will be marked or identified.  You may be given antibiotic medicine to help prevent infection. PROCEDURE  To reduce your risk of infection:  Your health care team will wash or sanitize their hands.  Your skin will be washed with soap.  An IV tube may be inserted into one of your veins.  You will be given a medicine to make you fall asleep (general anesthetic).  A breathing tube will be placed in your mouth.  The surgeon will make several small cuts (incisions) in your abdomen.  A thin, lighted tube (laparoscope) that has a tiny camera on the end will be inserted through one of the small incisions. The camera on the laparoscope will send a picture to a TV screen (monitor) in the operating room. This will give the surgeon a good view inside your abdomen.  A gas will be pumped into your abdomen. This will expand your abdomen to give the surgeon more room to perform the surgery.  Other tools that are needed for the procedure will be inserted through the other incisions. The gallbladder will   be removed through one of the incisions.  After your gallbladder has been removed, the incisions will be closed with stitches (sutures), staples, or skin glue.  Your incisions may be covered with a bandage (dressing). The procedure may vary among health care providers and hospitals. AFTER THE PROCEDURE  Your blood  pressure, heart rate, breathing rate, and blood oxygen level will be monitored often until the medicines you were given have worn off.  You will be given medicines as needed to control your pain.   This information is not intended to replace advice given to you by your health care provider. Make sure you discuss any questions you have with your health care provider.   Document Released: 03/25/2005 Document Revised: 12/14/2014 Document Reviewed: 11/04/2012 Elsevier Interactive Patient Education 2016 [employer]  [facility]

## 2015-04-13 NOTE — Progress Notes (Addendum)
[name], female   DOB: [birth year], 65 y.o.   MRN: [id number]  Chief Complaint  Patient presents with  . Follow-up    HPI Nicole Hardy is a 65 y.o. female here today for her two week follow up gallstones. Patient had a ultrasound done on 04/04/15. She states has been asymptomatic since her last visit. She's feeling very well. No dietary intolerance..   Patient states she had a blood clot in her leg in 2015.   I personally reviewed the patient's history. HPI  Past Medical History  Diagnosis Date  . Hypercholesterolemia   . Vitamin D deficiency   . Nephrolithiasis   . Degenerative disc disease, cervical     cervical  . Meralgia paraesthetica   . Neurodermatitis   . Osteopenia   . Migraines   . Obesity     Past Surgical History  Procedure Laterality Date  . Laparoscopic surgery  1990    nephrolithiasis (Dr Orson Slick)  . Lithotripsy  2010, 1980's  . Extracorporeal shock wave lithotripsy Right 11/17/2014    Procedure: EXTRACORPOREAL SHOCK WAVE LITHOTRIPSY (ESWL);  Surgeon: Orson Ape, MD;  Location: [facility];  Service: Urology;  Laterality: Right;  . Extracorporeal shock wave lithotripsy Left 12/15/2014    Procedure: EXTRACORPOREAL SHOCK WAVE LITHOTRIPSY (ESWL);  Surgeon: Orson Ape, MD;  Location: [facility];  Service: Urology;  Laterality: Left;  . Pessary  2003, 2016    Family History  Problem Relation Age of Onset  . Hypertension Father   . Hypertension Mother   . Asthma Sister   . Hypertension Sister   . Diabetes Sister   . Breast cancer Sister 40  . Rectal cancer Sister     Social History Social History  Substance Use Topics  . Smoking status: Never Smoker   . Smokeless tobacco: Never Used  . Alcohol Use: No    Allergies  Allergen Reactions  . Metronidazole Other (See Comments)    Causes thrush in mouth    Current Outpatient Prescriptions  Medication Sig Dispense Refill  . ergocalciferol (VITAMIN D2) 50000 UNITS capsule Take 1 capsule  (50,000 Units total) by mouth once a week. 4 capsule 3  . HYDROcodone-acetaminophen (NORCO/VICODIN) 5-325 MG per tablet Take 1 tablet by mouth every 6 (six) hours as needed for moderate pain.    Marland Kitchen nystatin cream (MYCOSTATIN) Apply 1 application topically 2 (two) times daily. (Patient taking differently: Apply 1 application topically 2 (two) times daily as needed. ) 30 g 1  . ondansetron (ZOFRAN ODT) 8 MG disintegrating tablet Take 1 tablet (8 mg total) by mouth every 6 (six) hours as needed for nausea or vomiting. 10 tablet 3   No current facility-administered medications for this visit.    Review of Systems Review of Systems  Constitutional: Negative.   Respiratory: Negative.   Cardiovascular: Negative.     Blood pressure 132/78, pulse 76, resp. rate 12, height 5\' 6"  (1.676 m), weight 225 lb (102.059 kg), last menstrual period 03/26/2003.  Physical Exam Physical Exam  Constitutional: She is oriented to person, place, and time. She appears well-developed and well-nourished.  Eyes: Conjunctivae are normal. No scleral icterus.  Neck: Neck supple.  Cardiovascular: Normal rate, regular rhythm and normal heart sounds.   Pulmonary/Chest: Effort normal and breath sounds normal.  Abdominal: Soft. Normal appearance and bowel sounds are normal. There is no hepatomegaly. There is no tenderness.  Lymphadenopathy:    She has no cervical adenopathy.  Neurological: She is alert and oriented  to person, place, and time.  Skin: Skin is warm and dry.    Data Reviewed Ultrasound showed evidence of cholelithiasis with diffuse gallbladder wall thickening. Common bile duct dimension: 12 mm. No stone identified. Prior LFTs in December 2016 showed no elevation.    Assessment    Acute cholecystitis, resolved.    Plan    Indications for surgical removal of the gallbladder discussed. The common bile duct is fairly prominent, but the patient has not shown elevation of her liver function studies and is  presently asymptomatic. Intraoperative cholangiograms will be completed.    Laparoscopic Cholecystectomy with Intraoperative Cholangiogram. The procedure, including it's potential risks and complications (including but not limited to infection, bleeding, injury to intra-abdominal organs or bile ducts, bile leak, poor cosmetic result, sepsis and death) were discussed with the patient in detail. Non-operative options, including their inherent risks (acute calculous cholecystitis with possible choledocholithiasis or gallstone pancreatitis, with the risk of ascending cholangitis, sepsis, and death) were discussed as well. The patient expressed and understanding of what we discussed and wishes to proceed with laparoscopic cholecystectomy. The patient further understands that if it is technically not possible, or it is unsafe to proceed laparoscopically, that I will convert to an open cholecystectomy.  [facility]  PCP:  Dale DurhamScott, Charlene   This information has been scribed by Ples SpecterJessica Qualls CMA.    Nicole Hardy, Nicole Hardy 04/15/2015, 10:13 AM

## 2015-04-15 DIAGNOSIS — K8010 Calculus of gallbladder with chronic cholecystitis without obstruction: Secondary | ICD-10-CM | POA: Insufficient documentation

## 2015-04-15 NOTE — H&P (Signed)
HPI  Nicole Hardy is a 65 y.o. female here today for her two week follow up gallstones. Patient had a ultrasound done on 04/04/15. She states has been asymptomatic since her last visit. She's feeling very well. No dietary intolerance..  Patient states she had a blood clot in her leg in 2015.  HPI  Past Medical History   Diagnosis  Date   .  Hypercholesterolemia    .  Vitamin D deficiency    .  Nephrolithiasis    .  Degenerative disc disease, cervical      cervical   .  Meralgia paraesthetica    .  Neurodermatitis    .  Osteopenia    .  Migraines    .  Obesity     Past Surgical History   Procedure  Laterality  Date   .  Laparoscopic surgery   1990     nephrolithiasis (Dr Orson SlickHarmon)   .  Lithotripsy   2010, 1980's   .  Extracorporeal shock wave lithotripsy  Right  11/17/2014     Procedure: EXTRACORPOREAL SHOCK WAVE LITHOTRIPSY (ESWL); Surgeon: Orson ApeMichael R Wolff, MD; Location: [facility]S; Service: Urology; Laterality: Right;   .  Extracorporeal shock wave lithotripsy  Left  12/15/2014     Procedure: EXTRACORPOREAL SHOCK WAVE LITHOTRIPSY (ESWL); Surgeon: Orson ApeMichael R Wolff, MD; Location: [facility]; Service: Urology; Laterality: Left;   .  Pessary   2003, 2016    Family History   Problem  Relation  Age of Onset   .  Hypertension  Father    .  Hypertension  Mother    .  Asthma  Sister    .  Hypertension  Sister    .  Diabetes  Sister    .  Breast cancer  Sister  [id number]   .  Rectal cancer  Sister     Social History  Social History   Substance Use Topics   .  Smoking status:  Never Smoker   .  Smokeless tobacco:  Never Used   .  Alcohol Use:  No    Allergies   Allergen  Reactions   .  Metronidazole  Other (See Comments)     Causes thrush in mouth    Current Outpatient Prescriptions   Medication  Sig  Dispense  Refill   .  ergocalciferol (VITAMIN D2) 50000 UNITS capsule  Take 1 capsule (50,000 Units total) by mouth once a week.  4 capsule  3   .  HYDROcodone-acetaminophen (NORCO/VICODIN)  5-325 MG per tablet  Take 1 tablet by mouth every 6 (six) hours as needed for moderate pain.     Marland Kitchen.  nystatin cream (MYCOSTATIN)  Apply 1 application topically 2 (two) times daily. (Patient taking differently: Apply 1 application topically 2 (two) times daily as needed. )  30 g  1   .  ondansetron (ZOFRAN ODT) 8 MG disintegrating tablet  Take 1 tablet (8 mg total) by mouth every 6 (six) hours as needed for nausea or vomiting.  10 tablet  3    No current facility-administered medications for this visit.    Review of Systems  Review of Systems  Constitutional: Negative.  Respiratory: Negative.  Cardiovascular: Negative.   Blood pressure 132/78, pulse 76, resp. rate 12, height 5\' 6"  (1.676 m), weight 225 lb (102.059 kg), last menstrual period 03/26/2003.  Physical Exam  Physical Exam  Constitutional: She is oriented to person, place, and time. She appears well-developed and well-nourished.  Eyes: Conjunctivae  are normal. No scleral icterus.  Neck: Neck supple.  Cardiovascular: Normal rate, regular rhythm and normal heart sounds.  Pulmonary/Chest: Effort normal and breath sounds normal.  Abdominal: Soft. Normal appearance and bowel sounds are normal. There is no hepatomegaly. There is no tenderness.  Lymphadenopathy:  She has no cervical adenopathy.  Neurological: She is alert and oriented to person, place, and time.  Skin: Skin is warm and dry.   Data Reviewed  Ultrasound showed evidence of cholelithiasis with diffuse gallbladder wall thickening. Common bile duct dimension: 12 mm. No stone identified. Prior LFTs in December 2016 showed no elevation.  Assessment   Acute cholecystitis, resolved.   Plan   Indications for surgical removal of the gallbladder discussed. The common bile duct is fairly prominent, but the patient has not shown elevation of her liver function studies and is presently asymptomatic. Intraoperative cholangiograms will be completed.   Laparoscopic Cholecystectomy  with Intraoperative Cholangiogram. The procedure, including it's potential risks and complications (including but not limited to infection, bleeding, injury to intra-abdominal organs or bile ducts, bile leak, poor cosmetic result, sepsis and death) were discussed with the patient in detail. Non-operative options, including their inherent risks (acute calculous cholecystitis with possible choledocholithiasis or gallstone pancreatitis, with the risk of ascending cholangitis, sepsis, and death) were discussed as well. The patient expressed and understanding of what we discussed and wishes to proceed with laparoscopic cholecystectomy. The patient further understands that if it is technically not possible, or it is unsafe to proceed laparoscopically, that I will convert to an open cholecystectomy.  Patient's surgery has been scheduled for 04-24-15 at [facility].  PCP: Dale Landmark  This information has been scribed by Ples Specter CMA.  Earline Mayotte

## 2015-04-18 ENCOUNTER — Encounter: Payer: Self-pay | Admitting: *Deleted

## 2015-04-18 ENCOUNTER — Other Ambulatory Visit: Payer: Self-pay

## 2015-04-18 NOTE — Patient Instructions (Signed)
  Your procedure is scheduled on: 04/24/15 Report to Day Surgery.[facility]OND FLOOR To find out your arrival time please call [phone] between 1PM - 3PM on 04/21/15  Remember: Instructions that are not followed completely may result in serious medical risk, up to and including death, or upon the discretion of your surgeon and anesthesiologist your surgery may need to be rescheduled.    __X__ 1. Do not eat food or drink liquids after midnight. No gum chewing or hard candies.     ____ 2. No Alcohol for 24 hours before or after surgery. X  ____ 3. Bring all medications with you on the day of surgery if instructed.    __X__ 4. Notify your doctor if there is any change in your medical condition     (cold, fever, infections).     Do not wear jewelry, make-up, hairpins, clips or nail polish.  Do not wear lotions, powders, or perfumes. You may wear deodorant.  Do not shave 48 hours prior to surgery. Men may shave face and neck.  Do not bring valuables to the hospital.    [facility] for any belongings or valuables.               Contacts, dentures or bridgework may not be worn into surgery.  Leave your suitcase in the car. After surgery it may be brought to your room.  For patients admitted to the hospital, discharge time is determined by your                treatment team.   Patients discharged the day of surgery will not be allowed to drive home.   Please read over the following fact sheets that you were given:   Surgical Site Infection Prevention   ____ Take these medicines the morning of surgery with A SIP OF WATER:    1. RANITIDINE  2.   3.   4.  5.  6.  ____ Fleet Enema (as directed)   ____ Use CHG Soap as directed  ____ Use inhalers on the day of surgery  ____ Stop metformin 2 days prior to surgery    ____ Take 1/2 of usual insulin dose the night before surgery and none on the morning of surgery.   ____ Stop Coumadin/Plavix/aspirin on    ____ Stop Anti-inflammatories on   ____ Stop supplements until after surgery.    ____ Bring C-Pap to the hospital.

## 2015-04-19 ENCOUNTER — Ambulatory Visit: Payer: Self-pay | Admitting: Internal Medicine

## 2015-04-24 ENCOUNTER — Encounter: Payer: Self-pay | Admitting: *Deleted

## 2015-04-24 ENCOUNTER — Ambulatory Visit: Payer: Managed Care, Other (non HMO) | Admitting: Certified Registered Nurse Anesthetist

## 2015-04-24 ENCOUNTER — Ambulatory Visit
Admission: RE | Admit: 2015-04-24 | Discharge: 2015-04-24 | Disposition: A | Discharge status: Home / Self Care | Payer: Managed Care, Other (non HMO) | Source: Ambulatory Visit | Attending: General Surgery | Admitting: General Surgery

## 2015-04-24 ENCOUNTER — Ambulatory Visit: Payer: Managed Care, Other (non HMO)

## 2015-04-24 ENCOUNTER — Encounter
Admission: RE | Disposition: A | Discharge status: Home / Self Care | Payer: Self-pay | Source: Ambulatory Visit | Attending: General Surgery

## 2015-04-24 DIAGNOSIS — Z800 Family history of malignant neoplasm of digestive organs: Secondary | ICD-10-CM | POA: Insufficient documentation

## 2015-04-24 DIAGNOSIS — M5030 Other cervical disc degeneration, unspecified cervical region: Secondary | ICD-10-CM | POA: Insufficient documentation

## 2015-04-24 DIAGNOSIS — Z87442 Personal history of urinary calculi: Secondary | ICD-10-CM | POA: Insufficient documentation

## 2015-04-24 DIAGNOSIS — G43909 Migraine, unspecified, not intractable, without status migrainosus: Secondary | ICD-10-CM | POA: Insufficient documentation

## 2015-04-24 DIAGNOSIS — E7800 Pure hypercholesterolemia, unspecified: Secondary | ICD-10-CM | POA: Insufficient documentation

## 2015-04-24 DIAGNOSIS — Z833 Family history of diabetes mellitus: Secondary | ICD-10-CM | POA: Insufficient documentation

## 2015-04-24 DIAGNOSIS — E559 Vitamin D deficiency, unspecified: Secondary | ICD-10-CM | POA: Insufficient documentation

## 2015-04-24 DIAGNOSIS — M8580 Other specified disorders of bone density and structure, unspecified site: Secondary | ICD-10-CM | POA: Diagnosis not present

## 2015-04-24 DIAGNOSIS — Z803 Family history of malignant neoplasm of breast: Secondary | ICD-10-CM | POA: Diagnosis not present

## 2015-04-24 DIAGNOSIS — K8044 Calculus of bile duct with chronic cholecystitis without obstruction: Secondary | ICD-10-CM | POA: Diagnosis not present

## 2015-04-24 DIAGNOSIS — Z888 Allergy status to other drugs, medicaments and biological substances status: Secondary | ICD-10-CM | POA: Insufficient documentation

## 2015-04-24 DIAGNOSIS — Z8249 Family history of ischemic heart disease and other diseases of the circulatory system: Secondary | ICD-10-CM | POA: Diagnosis not present

## 2015-04-24 DIAGNOSIS — Z79899 Other long term (current) drug therapy: Secondary | ICD-10-CM | POA: Insufficient documentation

## 2015-04-24 DIAGNOSIS — Z825 Family history of asthma and other chronic lower respiratory diseases: Secondary | ICD-10-CM | POA: Diagnosis not present

## 2015-04-24 DIAGNOSIS — K8020 Calculus of gallbladder without cholecystitis without obstruction: Secondary | ICD-10-CM | POA: Diagnosis present

## 2015-04-24 DIAGNOSIS — Z419 Encounter for procedure for purposes other than remedying health state, unspecified: Secondary | ICD-10-CM

## 2015-04-24 DIAGNOSIS — E669 Obesity, unspecified: Secondary | ICD-10-CM | POA: Insufficient documentation

## 2015-04-24 DIAGNOSIS — K8010 Calculus of gallbladder with chronic cholecystitis without obstruction: Secondary | ICD-10-CM | POA: Diagnosis not present

## 2015-04-24 HISTORY — PX: CHOLECYSTECTOMY: SHX55

## 2015-04-24 HISTORY — DX: Other specified postprocedural states: Z98.890

## 2015-04-24 HISTORY — DX: Other complications of anesthesia, initial encounter: T88.59XA

## 2015-04-24 HISTORY — DX: Nausea with vomiting, unspecified: R11.2

## 2015-04-24 HISTORY — DX: Adverse effect of unspecified anesthetic, initial encounter: T41.45XA

## 2015-04-24 SURGERY — LAPAROSCOPIC CHOLECYSTECTOMY WITH INTRAOPERATIVE CHOLANGIOGRAM
Anesthesia: General | Site: Abdomen | Wound class: Clean Contaminated

## 2015-04-24 MED ORDER — IOTHALAMATE MEGLUMINE 60 % INJ SOLN
INTRAMUSCULAR | Status: DC | PRN
  Administered 2015-04-24: 20 mL
  Administered 2015-04-24: 11:00:00

## 2015-04-24 MED ORDER — KETOROLAC TROMETHAMINE 30 MG/ML IJ SOLN
INTRAMUSCULAR | Status: DC | PRN
  Administered 2015-04-24: 15 mg via INTRAVENOUS

## 2015-04-24 MED ORDER — CEFAZOLIN SODIUM-DEXTROSE 2-3 GM-% IV SOLR
2.0000 g | INTRAVENOUS | Status: AC
  Administered 2015-04-24: 2 g via INTRAVENOUS

## 2015-04-24 MED ORDER — ROCURONIUM BROMIDE 100 MG/10ML IV SOLN
INTRAVENOUS | Status: DC | PRN
  Administered 2015-04-24: 10 mg via INTRAVENOUS

## 2015-04-24 MED ORDER — HYDROMORPHONE HCL 1 MG/ML IJ SOLN
INTRAMUSCULAR | Status: AC
  Administered 2015-04-24: 0.5 mg via INTRAVENOUS
  Filled 2015-04-24: qty 1

## 2015-04-24 MED ORDER — FENTANYL CITRATE (PF) 100 MCG/2ML IJ SOLN
25.0000 ug | INTRAMUSCULAR | Status: DC | PRN
  Administered 2015-04-24 (×3): 50 ug via INTRAVENOUS

## 2015-04-24 MED ORDER — OXYCODONE HCL 5 MG/5ML PO SOLN: 5.0000 mg | Freq: Once | ORAL | Status: AC | PRN

## 2015-04-24 MED ORDER — LACTATED RINGERS IV SOLN
INTRAVENOUS | Status: DC
  Administered 2015-04-24: 10:00:00 via INTRAVENOUS

## 2015-04-24 MED ORDER — PROPOFOL 10 MG/ML IV BOLUS
INTRAVENOUS | Status: DC | PRN
  Administered 2015-04-24: 180 mg via INTRAVENOUS

## 2015-04-24 MED ORDER — FENTANYL CITRATE (PF) 100 MCG/2ML IJ SOLN
INTRAMUSCULAR | Status: AC
  Administered 2015-04-24: 50 ug via INTRAVENOUS
  Filled 2015-04-24: qty 2

## 2015-04-24 MED ORDER — OXYCODONE HCL 5 MG PO TABS
5.0000 mg | ORAL_TABLET | Freq: Once | ORAL | Status: AC | PRN
  Administered 2015-04-24: 5 mg via ORAL

## 2015-04-24 MED ORDER — CEFAZOLIN SODIUM-DEXTROSE 2-3 GM-% IV SOLR
INTRAVENOUS | Status: AC
  Filled 2015-04-24: qty 50

## 2015-04-24 MED ORDER — FENTANYL CITRATE (PF) 100 MCG/2ML IJ SOLN
INTRAMUSCULAR | Status: DC | PRN
  Administered 2015-04-24: 100 ug via INTRAVENOUS

## 2015-04-24 MED ORDER — OXYCODONE HCL 5 MG PO TABS
ORAL_TABLET | ORAL | Status: DC
Start: 2015-04-24 — End: 2015-04-24
  Filled 2015-04-24: qty 1

## 2015-04-24 MED ORDER — SUCCINYLCHOLINE CHLORIDE 20 MG/ML IJ SOLN
INTRAMUSCULAR | Status: DC | PRN
  Administered 2015-04-24: 100 mg via INTRAVENOUS

## 2015-04-24 MED ORDER — ONDANSETRON HCL 4 MG/2ML IJ SOLN
INTRAMUSCULAR | Status: DC | PRN
  Administered 2015-04-24: 4 mg via INTRAVENOUS

## 2015-04-24 MED ORDER — HYDROMORPHONE HCL 1 MG/ML IJ SOLN
0.5000 mg | INTRAMUSCULAR | Status: AC | PRN
  Administered 2015-04-24 (×4): 0.5 mg via INTRAVENOUS

## 2015-04-24 MED ORDER — MIDAZOLAM HCL 2 MG/2ML IJ SOLN
INTRAMUSCULAR | Status: DC | PRN
  Administered 2015-04-24: 2 mg via INTRAVENOUS

## 2015-04-24 MED ORDER — ACETAMINOPHEN 10 MG/ML IV SOLN
INTRAVENOUS | Status: AC
  Filled 2015-04-24: qty 100

## 2015-04-24 MED ORDER — ACETAMINOPHEN 10 MG/ML IV SOLN
INTRAVENOUS | Status: DC | PRN
  Administered 2015-04-24: 1000 mg via INTRAVENOUS

## 2015-04-24 MED ORDER — DEXAMETHASONE SODIUM PHOSPHATE 10 MG/ML IJ SOLN
INTRAMUSCULAR | Status: DC | PRN
  Administered 2015-04-24: 5 mg via INTRAVENOUS

## 2015-04-24 MED ORDER — SODIUM CHLORIDE 0.9 % IJ SOLN
INTRAMUSCULAR | Status: AC
Start: 2015-04-24 — End: 2015-04-24
  Filled 2015-04-24: qty 50

## 2015-04-24 MED ORDER — GLYCOPYRROLATE 0.2 MG/ML IJ SOLN
INTRAMUSCULAR | Status: DC | PRN
  Administered 2015-04-24: 0.6 mg via INTRAVENOUS

## 2015-04-24 MED ORDER — SUGAMMADEX SODIUM 200 MG/2ML IV SOLN
INTRAVENOUS | Status: DC | PRN
  Administered 2015-04-24: 200 mg via INTRAVENOUS

## 2015-04-24 SURGICAL SUPPLY — 38 items
APPLIER CLIP ROT 10 11.4 M/L (STAPLE) ×2
BLADE CLIPPER SURG (BLADE) ×2 IMPLANT
BLADE SURG 11 STRL SS SAFETY (MISCELLANEOUS) ×2 IMPLANT
CANISTER SUCT 1200ML W/VALVE (MISCELLANEOUS) ×2 IMPLANT
CANNULA DILATOR 10 W/SLV (CANNULA) ×2 IMPLANT
CANNULA DILATOR 5 W/SLV (CANNULA) ×6 IMPLANT
CATH CHOLANG 76X19 KUMAR (CATHETERS) ×2 IMPLANT
CHLORAPREP W/TINT 26ML (MISCELLANEOUS) ×2 IMPLANT
CLIP APPLIE ROT 10 11.4 M/L (STAPLE) ×1 IMPLANT
CONRAY 60ML FOR OR (MISCELLANEOUS) ×2 IMPLANT
DISSECTOR KITTNER STICK (MISCELLANEOUS) ×1 IMPLANT
DISSECTORS/KITTNER STICK (MISCELLANEOUS) ×2
DRAPE SHEET LG 3/4 BI-LAMINATE (DRAPES) ×2 IMPLANT
DRESSING TELFA 4X3 1S ST N-ADH (GAUZE/BANDAGES/DRESSINGS) ×2 IMPLANT
DRSG TEGADERM 2-3/8X2-3/4 SM (GAUZE/BANDAGES/DRESSINGS) ×6 IMPLANT
ENDOPOUCH RETRIEVER 10 (MISCELLANEOUS) ×2 IMPLANT
GLOVE BIO SURGEON STRL SZ7.5 (GLOVE) ×6 IMPLANT
GLOVE INDICATOR 8.0 STRL GRN (GLOVE) ×2 IMPLANT
GOWN STRL REUS W/ TWL LRG LVL3 (GOWN DISPOSABLE) ×3 IMPLANT
GOWN STRL REUS W/TWL LRG LVL3 (GOWN DISPOSABLE) ×3
IRRIGATION STRYKERFLOW (MISCELLANEOUS) ×1 IMPLANT
IRRIGATOR STRYKERFLOW (MISCELLANEOUS) ×2
IV LACTATED RINGERS 1000ML (IV SOLUTION) ×2 IMPLANT
KIT RM TURNOVER STRD PROC AR (KITS) ×2 IMPLANT
LABEL OR SOLS (LABEL) ×2 IMPLANT
NDL INSUFF ACCESS 14 VERSASTEP (NEEDLE) ×2 IMPLANT
NS IRRIG 500ML POUR BTL (IV SOLUTION) ×2 IMPLANT
PACK LAP CHOLECYSTECTOMY (MISCELLANEOUS) ×2 IMPLANT
PAD GROUND ADULT SPLIT (MISCELLANEOUS) ×2 IMPLANT
SCISSORS METZENBAUM CVD 33 (INSTRUMENTS) IMPLANT
SEAL FOR SCOPE WARMER C3101 (MISCELLANEOUS) IMPLANT
STRIP CLOSURE SKIN 1/2X4 (GAUZE/BANDAGES/DRESSINGS) ×2 IMPLANT
SUT VIC AB 0 CT2 27 (SUTURE) ×4 IMPLANT
SUT VIC AB 4-0 FS2 27 (SUTURE) IMPLANT
SWABSTK COMLB BENZOIN TINCTURE (MISCELLANEOUS) ×2 IMPLANT
TROCAR XCEL NON-BLD 11X100MML (ENDOMECHANICALS) ×2 IMPLANT
TUBING INSUFFLATOR HI FLOW (MISCELLANEOUS) ×2 IMPLANT
WATER STERILE IRR 1000ML POUR (IV SOLUTION) IMPLANT

## 2015-04-24 NOTE — Anesthesia Postprocedure Evaluation (Signed)
Anesthesia Post Note  Patient: Nicole Hardy  Procedure(s) Performed: Procedure(s) (LRB): LAPAROSCOPIC CHOLECYSTECTOMY WITH INTRAOPERATIVE CHOLANGIOGRAM (N/A)  Patient location during evaluation: PACU Anesthesia Type: General Level of consciousness: awake and alert Pain management: pain level controlled Vital Signs Assessment: post-procedure vital signs reviewed and stable Respiratory status: spontaneous breathing, nonlabored ventilation, respiratory function stable and patient connected to nasal cannula oxygen Cardiovascular status: blood pressure returned to baseline and stable Postop Assessment: no signs of nausea or vomiting Anesthetic complications: no    Last Vitals:  Filed Vitals:   04/24/15 1258 04/24/15 1313  BP:  145/77  Pulse: 89 79  Temp:  36.4 C  Resp: 19 16    Last Pain:  Filed Vitals:   04/24/15 1314  PainSc: 4                  Joseph K Piscitello

## 2015-04-24 NOTE — Anesthesia Postprocedure Evaluation (Deleted)
Anesthesia Post Note  Patient: Nicole Hardy  Procedure(s) Performed: Procedure(s) (LRB): LAPAROSCOPIC CHOLECYSTECTOMY WITH INTRAOPERATIVE CHOLANGIOGRAM (N/A)  Patient location during evaluation: PACU Anesthesia Type: General Level of consciousness: awake and alert Pain management: pain level controlled Vital Signs Assessment: post-procedure vital signs reviewed and stable Respiratory status: spontaneous breathing, nonlabored ventilation, respiratory function stable and patient connected to nasal cannula oxygen Cardiovascular status: blood pressure returned to baseline and stable Postop Assessment: no signs of nausea or vomiting Anesthetic complications: no    Last Vitals:  Filed Vitals:   04/24/15 1313 04/24/15 1400  BP: 145/77 138/76  Pulse: 79 85  Temp: 36.4 C   Resp: 16     Last Pain:  Filed Vitals:   04/24/15 1414  PainSc: 3                  Jomarie LongsJoseph K Piscitello

## 2015-04-24 NOTE — H&P (Signed)
No change in clinical history. For cholecystectomy.

## 2015-04-24 NOTE — Anesthesia Procedure Notes (Signed)
Procedure Name: Intubation Date/Time: 04/24/2015 9:53 AM Performed by: Derinda LateIACONE, Keaisha Sublette Pre-anesthesia Checklist: Patient identified, Emergency Drugs available, Suction available, Patient being monitored and Timeout performed Patient Re-evaluated:Patient Re-evaluated prior to inductionOxygen Delivery Method: Circle system utilized Preoxygenation: Pre-oxygenation with 100% oxygen Intubation Type: IV induction Ventilation: Mask ventilation without difficulty Laryngoscope Size: Miller and 2 Grade View: Grade I Tube type: Oral Tube size: 7.0 mm Airway Equipment and Method: Stylet Placement Confirmation: positive ETCO2,  ETT inserted through vocal cords under direct vision and breath sounds checked- equal and bilateral Secured at: 18 cm Tube secured with: Tape Dental Injury: Teeth and Oropharynx as per pre-operative assessment

## 2015-04-24 NOTE — Anesthesia Preprocedure Evaluation (Signed)
Anesthesia Evaluation  Patient identified by MRN, date of birth, ID band Patient awake    Reviewed: Allergy & Precautions, H&P , NPO status , Patient's Chart, lab work & pertinent test results  History of Anesthesia Complications (+) PONV and history of anesthetic complications  Airway Mallampati: II  TM Distance: >3 FB Neck ROM: full    Dental  (+) Poor Dentition, Chipped, Caps   Pulmonary neg pulmonary ROS, neg shortness of breath,    Pulmonary exam normal breath sounds clear to auscultation       Cardiovascular Exercise Tolerance: Good (-) angina(-) Past MI and (-) DOE negative cardio ROS Normal cardiovascular exam Rhythm:regular Rate:Normal     Neuro/Psych  Headaches,  Neuromuscular disease negative psych ROS   GI/Hepatic negative GI ROS, Neg liver ROS, neg GERD  ,  Endo/Other  negative endocrine ROS  Renal/GU Renal disease  negative genitourinary   Musculoskeletal  (+) Arthritis ,   Abdominal   Peds  Hematology negative hematology ROS (+)   Anesthesia Other Findings Past Medical History:   Hypercholesterolemia                                         Vitamin D deficiency                                         Degenerative disc disease, cervical                            Comment:cervical   Neurodermatitis                                              Osteopenia                                                   Migraines                                                    Obesity                                                      Nephrolithiasis                                              Meralgia paraesthetica                                       Complication of anesthesia  PONV (postoperative nausea and vomiting)                    Past Surgical History:   laparoscopic surgery                             1990           Comment:nephrolithiasis (Dr Orson SlickHarmon)  LITHOTRIPSY                                      2010, 198*   EXTRACORPOREAL SHOCK WAVE LITHOTRIPSY           Right 11/17/2014      Comment:Procedure: EXTRACORPOREAL SHOCK WAVE               LITHOTRIPSY (ESWL);  Surgeon: Orson ApeMichael R Wolff,               MD;  Location: [facility] ORS;  Service: Urology;                Laterality: Right;   EXTRACORPOREAL SHOCK WAVE LITHOTRIPSY           Left 12/15/2014       Comment:Procedure: EXTRACORPOREAL SHOCK WAVE               LITHOTRIPSY (ESWL);  Surgeon: Orson ApeMichael R Wolff,               MD;  Location: [facility] ORS;  Service: Urology;                Laterality: Left;   pessary                                          2003, 2016     Reproductive/Obstetrics negative OB ROS                             Anesthesia Physical Anesthesia Plan  ASA: III  Anesthesia Plan: General ETT   Post-op Pain Management:    Induction:   Airway Management Planned:   Additional Equipment:   Intra-op Plan:   Post-operative Plan:   Informed Consent: I have reviewed the patients History and Physical, chart, labs and discussed the procedure including the risks, benefits and alternatives for the proposed anesthesia with the patient or authorized representative who has indicated his/her understanding and acceptance.   Dental Advisory Given  Plan Discussed with: Anesthesiologist, CRNA and Surgeon  Anesthesia Plan Comments:         Anesthesia Quick Evaluation

## 2015-04-24 NOTE — Op Note (Signed)
Preoperative diagnosis: Chronic cholecystitis and cholelithiasis.  Postoperative diagnosis: Same.   Operative procedure: Laparoscopic cholecystectomy with intraoperative cholangiogram.  Operating surgeon: JeDwyane Deeffrey Turner, M.D.  Anesthesia: Gen. endotracheal.  Estimated blood loss: Less than 5 mL.  Clinical note this 65 year old woman has had several episodes of severe abdominal pain. Ultrasound showed evidence of cholelithiasis as well as thickening of the gallbladder wall and prominence of the common bile duct. Liver function studies have been normal. She was admitted for elective cholecystectomy. She received prior the procedure. She had SCD stockings for DVT prevention.  Operative note: With the patient under adequate general endotracheal anesthesia the abdomen was prepped with ChloraPrep and draped. In Trendelenburg position a  Vaires needle was placed through a trans-umbilical incision. After assuring intra-abdominal location with the hanging drop test the abdomen was insufflated with CO2 a 10 mmHg pressure. A 10 mm Step port was expanded and inspection showed no evidence of injury from initial port placement. The patient was placed into reverse Trendelenburg position and rolled to the left. An 11 mm XL port was placed in the epigastrium. 25 mm Depth ports were placed laterally under direct vision. Due to the generous amount of intra-abdominal adipose tissue a fifth port was placed in the right anterior axillary line. Through this port a Peer retractor was used to distract the transverse colon and duodenum medially. The gallbladder was decompressed a small amount of murky brown fluid, but this did allow grasping the gallbladder and placing it on cephalad traction. Adherent omentum was taken down with electrocautery dissection. The neck of the gallbladder was cleared and the cystic duct identified. A Kumar clamp was placed and fluoroscopic cholangiograms completed using 20 mL of one half strength  Conray 60. This showed prompt filling of the common hepatic duct which was moderately dilated, reflux in the common hepatic ducts and free flow into the common bile duct and duodenum. Visualization of the pancreatic duct was also noted. The distal common bile duct tapered normally. The most dilated portion of the extrahepatic biliary tree was the common hepatic duct. No stone defects were identified. Free flow again noted into the duodenum.  The cystic duct was doubly clipped and divided as was the cystic artery. The gallbladder was then dissected free from the liver bed making use of hook cautery dissection. A small 3 arterial vessel was doubly clipped for hemostasis. The intact gallbladder was then placed into an Endo Catch bag and delivered to the umbilical port site. Inspection from the epigastric site did not show evidence of any injury from initial port placement. The incision in the fascia was made to allow the gallbladder to come up to the fascial level. This was then opened and a large number of stones up to 10 mm in diameter were extracted. Once the gallbladder was removed pneumoperitoneum was reestablished. Inspection of the right upper quadrant showed all previously placed clips intact. The area was irrigated with lactated Ringer's solution. Fascia at the umbilicus and at the epigastric port site was closed with a 0 Vicryl figure-of-eight suture. Skin incisions were closed with 4-0 Vicryl subcutaneous sutures. Benzoin, Steri-Strips, Telfa and Tegaderm dressings were applied.  The patient tolerated the procedure well and was taken recovery room in stable condition.

## 2015-04-24 NOTE — Transfer of Care (Signed)
Immediate Anesthesia Transfer of Care Note  Patient: Nicole CanterElla Loretto  Procedure(s) Performed: Procedure(s): LAPAROSCOPIC CHOLECYSTECTOMY WITH INTRAOPERATIVE CHOLANGIOGRAM (N/A)  Patient Location: PACU  Anesthesia Type:General  Level of Consciousness: awake, alert  and oriented  Airway & Oxygen Therapy: Patient Spontanous Breathing and Patient connected to face mask oxygen  Post-op Assessment: Report given to RN and Post -op Vital signs reviewed and stable  Post vital signs: Reviewed and stable  Last Vitals:  Filed Vitals:   04/24/15 0911  BP: 140/73  Pulse: 84  Temp: 36.7 C  Resp: 16    Complications: No apparent anesthesia complications

## 2015-04-25 ENCOUNTER — Encounter: Payer: Self-pay | Admitting: General Surgery

## 2015-04-25 LAB — SURGICAL PATHOLOGY

## 2015-04-27 ENCOUNTER — Telehealth: Payer: Self-pay | Admitting: Internal Medicine

## 2015-04-27 NOTE — Telephone Encounter (Signed)
Lm on pt vm to schedule a follow up appt.. Please schedule first available when pt calls back. Thanks

## 2015-05-03 ENCOUNTER — Ambulatory Visit (INDEPENDENT_AMBULATORY_CARE_PROVIDER_SITE_OTHER): Payer: Managed Care, Other (non HMO) | Admitting: General Surgery

## 2015-05-03 ENCOUNTER — Encounter: Payer: Self-pay | Admitting: General Surgery

## 2015-05-03 VITALS — BP 144/92 | HR 70 | Resp 16 | Ht 66.0 in | Wt 221.0 lb

## 2015-05-03 DIAGNOSIS — K8010 Calculus of gallbladder with chronic cholecystitis without obstruction: Secondary | ICD-10-CM

## 2015-05-03 NOTE — Progress Notes (Signed)
[name], female   DOB: [birth year], 65 y.o.   MRN: [id number]  Chief Complaint  Patient presents with  . Routine Post Op    gallbladder     HPI Rheya Hardy is a 65 y.o. female here today for her post op gallbladder surgery done on 04/24/15. Patient states she is doing well. She reports no dietary intolerance. Normal bowel movements. Minimal narcotic requirements post procedure.  I personally reviewed the patient's history. HPI  Past Medical History  Diagnosis Date  . Hypercholesterolemia   . Vitamin D deficiency   . Degenerative disc disease, cervical     cervical  . Neurodermatitis   . Osteopenia   . Migraines   . Obesity   . Nephrolithiasis   . Meralgia paraesthetica   . Complication of anesthesia   . PONV (postoperative nausea and vomiting)     Past Surgical History  Procedure Laterality Date  . Laparoscopic surgery  1990    nephrolithiasis (Dr Orson Slick)  . Lithotripsy  2010, 1980's  . Extracorporeal shock wave lithotripsy Right 11/17/2014    Procedure: EXTRACORPOREAL SHOCK WAVE LITHOTRIPSY (ESWL);  Surgeon: Orson Ape, MD;  Location: [facility] ORS;  Service: Urology;  Laterality: Right;  . Extracorporeal shock wave lithotripsy Left 12/15/2014    Procedure: EXTRACORPOREAL SHOCK WAVE LITHOTRIPSY (ESWL);  Surgeon: Orson Ape, MD;  Location: [facility] ORS;  Service: Urology;  Laterality: Left;  . Pessary  2003, 2016  . Cholecystectomy N/A 04/24/2015    Procedure: LAPAROSCOPIC CHOLECYSTECTOMY WITH INTRAOPERATIVE CHOLANGIOGRAM;  Surgeon: Earline Mayotte, MD;  Location: [facility] ORS;  Service: General;  Laterality: N/A;    Family History  Problem Relation Age of Onset  . Hypertension Father   . Hypertension Mother   . Asthma Sister   . Hypertension Sister   . Diabetes Sister   . Breast cancer Sister 75  . Rectal cancer Sister     Social History Social History  Substance Use Topics  . Smoking status: Never Smoker   . Smokeless tobacco: Never Used  . Alcohol  Use: No    Allergies  Allergen Reactions  . Metronidazole Other (See Comments)    Causes thrush in mouth    Current Outpatient Prescriptions  Medication Sig Dispense Refill  . conjugated estrogens (PREMARIN) vaginal cream Place 1 Applicatorful vaginally once a week.    . ergocalciferol (VITAMIN D2) 50000 UNITS capsule Take 1 capsule (50,000 Units total) by mouth once a week. 4 capsule 3  . nystatin cream (MYCOSTATIN) Apply 1 application topically 2 (two) times daily. (Patient taking differently: Apply 1 application topically 2 (two) times daily as needed. ) 30 g 1  . ondansetron (ZOFRAN ODT) 8 MG disintegrating tablet Take 1 tablet (8 mg total) by mouth every 6 (six) hours as needed for nausea or vomiting. 10 tablet 3  . ranitidine (ZANTAC) 75 MG tablet Take 7 mg by mouth.      No current facility-administered medications for this visit.    Review of Systems Review of Systems  Constitutional: Negative.   Respiratory: Negative.   Cardiovascular: Negative.   Neurological: Positive for headaches.    Blood pressure 144/92, pulse 70, resp. rate 16, height  (1.676 m), weight 221 lb (100.245 kg), last menstrual period 03/26/2003.  Physical Exam Physical Exam  Constitutional: She is oriented to person, place, and time. She appears well-developed and well-nourished.  Abdominal: Soft. Normal appearance. There is no tenderness.  Minimal bruising at epigastric site.  Neurological: She is alert  and oriented to person, place, and time.  Skin: Skin is warm and dry.  Psychiatric: Her behavior is normal.    Data Reviewed Cholangiogram showed diffuse dilatation of the common bile duct without focal filling defect. Refloat of the duodenum.  Assessment    Doing well status post cholecystectomy.    Plan    Patient may increase her activity as tolerated. No dietary restrictions except those related to excessive weight gain.     Follow up as needed. Proper lifting techniques  reviewed.   PCP:  Dale   This information has been scribed by Dorathy Daft RNBC.   Earline Mayotte 05/04/2015, 6:22 AM

## 2015-05-03 NOTE — Patient Instructions (Signed)
The patient is aware to call back for any questions or concerns. Proper lifting techniques reviewed.

## 2015-08-11 ENCOUNTER — Other Ambulatory Visit: Payer: Self-pay | Admitting: Internal Medicine

## 2015-08-11 NOTE — Telephone Encounter (Signed)
Nystatin refill request.

## 2015-08-29 ENCOUNTER — Other Ambulatory Visit: Payer: Self-pay | Admitting: Internal Medicine

## 2015-09-14 ENCOUNTER — Telehealth: Payer: Self-pay | Admitting: Internal Medicine

## 2015-09-14 NOTE — Telephone Encounter (Signed)
Lm on vm to schedule a CPE sometime in September..Marland Kitchen

## 2015-12-21 ENCOUNTER — Other Ambulatory Visit: Payer: Self-pay

## 2015-12-21 DIAGNOSIS — Z299 Encounter for prophylactic measures, unspecified: Secondary | ICD-10-CM

## 2015-12-21 NOTE — Progress Notes (Signed)
Patient came in to have blood drawn for testing per Susan's authorization. 

## 2015-12-22 ENCOUNTER — Ambulatory Visit (INDEPENDENT_AMBULATORY_CARE_PROVIDER_SITE_OTHER): Payer: Managed Care, Other (non HMO) | Admitting: Internal Medicine

## 2015-12-22 ENCOUNTER — Encounter: Payer: Self-pay | Admitting: Internal Medicine

## 2015-12-22 VITALS — BP 126/78 | HR 77 | Temp 98.3°F | Ht 66.0 in | Wt 229.2 lb

## 2015-12-22 DIAGNOSIS — I809 Phlebitis and thrombophlebitis of unspecified site: Secondary | ICD-10-CM

## 2015-12-22 DIAGNOSIS — E559 Vitamin D deficiency, unspecified: Secondary | ICD-10-CM

## 2015-12-22 DIAGNOSIS — Z0000 Encounter for general adult medical examination without abnormal findings: Secondary | ICD-10-CM | POA: Diagnosis not present

## 2015-12-22 DIAGNOSIS — K8010 Calculus of gallbladder with chronic cholecystitis without obstruction: Secondary | ICD-10-CM

## 2015-12-22 DIAGNOSIS — E7800 Pure hypercholesterolemia, unspecified: Secondary | ICD-10-CM

## 2015-12-22 DIAGNOSIS — M8580 Other specified disorders of bone density and structure, unspecified site: Secondary | ICD-10-CM | POA: Diagnosis not present

## 2015-12-22 LAB — CMP12+LP+TP+TSH+6AC+CBC/D/PLT
ALT: 24 IU/L (ref 0–32)
AST: 23 IU/L (ref 0–40)
Albumin/Globulin Ratio: 1.3 (ref 1.2–2.2)
Albumin: 3.9 g/dL (ref 3.6–4.8)
Alkaline Phosphatase: 78 IU/L (ref 39–117)
BUN/Creatinine Ratio: 17 (ref 12–28)
BUN: 13 mg/dL (ref 8–27)
Basophils Absolute: 0 10*3/uL (ref 0.0–0.2)
Basos: 0 %
Bilirubin Total: 0.8 mg/dL (ref 0.0–1.2)
Calcium: 9.2 mg/dL (ref 8.7–10.3)
Chloride: 106 mmol/L (ref 96–106)
Chol/HDL Ratio: 3.5 ratio units (ref 0.0–4.4)
Cholesterol, Total: 186 mg/dL (ref 100–199)
Creatinine, Ser: 0.76 mg/dL (ref 0.57–1.00)
EOS (ABSOLUTE): 0.1 10*3/uL (ref 0.0–0.4)
Eos: 1 %
Estimated CHD Risk: 0.6 times avg. (ref 0.0–1.0)
Free Thyroxine Index: 1.6 (ref 1.2–4.9)
GFR calc Af Amer: 97 mL/min/{1.73_m2} (ref >59)
GFR calc non Af Amer: 84 mL/min/{1.73_m2} (ref >59)
GGT: 12 IU/L (ref 0–60)
Globulin, Total: 3.1 g/dL (ref 1.5–4.5)
Glucose: 86 mg/dL (ref 65–99)
HDL: 53 mg/dL (ref >39)
Hematocrit: 37.9 % (ref 34.0–46.6)
Hemoglobin: 12.4 g/dL (ref 11.1–15.9)
Immature Grans (Abs): 0 10*3/uL (ref 0.0–0.1)
Immature Granulocytes: 0 %
Iron: 66 ug/dL (ref 27–139)
LDH: 205 IU/L (ref 119–226)
LDL Calculated: 122 mg/dL — ABNORMAL HIGH (ref 0–99)
Lymphocytes Absolute: 2.3 10*3/uL (ref 0.7–3.1)
Lymphs: 36 %
MCH: 29.8 pg (ref 26.6–33.0)
MCHC: 32.7 g/dL (ref 31.5–35.7)
MCV: 91 fL (ref 79–97)
Monocytes Absolute: 0.4 10*3/uL (ref 0.1–0.9)
Monocytes: 6 %
Neutrophils Absolute: 3.6 10*3/uL (ref 1.4–7.0)
Neutrophils: 57 %
Phosphorus: 3.3 mg/dL (ref 2.5–4.5)
Platelets: 293 10*3/uL (ref 150–379)
Potassium: 4.1 mmol/L (ref 3.5–5.2)
RBC: 4.16 x10E6/uL (ref 3.77–5.28)
RDW: 13.9 % (ref 12.3–15.4)
Sodium: 144 mmol/L (ref 134–144)
T3 Uptake Ratio: 27 % (ref 24–39)
T4, Total: 5.9 ug/dL (ref 4.5–12.0)
TSH: 1.97 u[IU]/mL (ref 0.450–4.500)
Total Protein: 7 g/dL (ref 6.0–8.5)
Triglycerides: 56 mg/dL (ref 0–149)
Uric Acid: 4.6 mg/dL (ref 2.5–7.1)
VLDL Cholesterol Cal: 11 mg/dL (ref 5–40)
WBC: 6.3 10*3/uL (ref 3.4–10.8)

## 2015-12-22 LAB — URINALYSIS
Bilirubin, UA: NEGATIVE
Glucose, UA: NEGATIVE
Ketones, UA: NEGATIVE
Nitrite, UA: NEGATIVE
RBC, UA: NEGATIVE
Specific Gravity, UA: 1.022 (ref 1.005–1.030)
Urobilinogen, Ur: 1 mg/dL (ref 0.2–1.0)
pH, UA: 7.5 (ref 5.0–7.5)

## 2015-12-22 LAB — HGB A1C W/O EAG: Hgb A1c MFr Bld: 5.8 % — ABNORMAL HIGH (ref 4.8–5.6)

## 2015-12-22 LAB — VITAMIN D 25 HYDROXY (VIT D DEFICIENCY, FRACTURES): Vit D, 25-Hydroxy: 19.7 ng/mL — ABNORMAL LOW (ref 30.0–100.0)

## 2015-12-22 MED ORDER — NYSTATIN 100000 UNIT/GM EX CREA: 1.0000 "application " | TOPICAL_CREAM | Freq: Two times a day (BID) | CUTANEOUS | 1 refills | Status: DC | PRN

## 2015-12-22 NOTE — Progress Notes (Signed)
[name], female   DOB: 1950-12-25, 65 y.o.   MRN: [id number]   Subjective:    [name], female    DOB: 1950/11/07, 65 y.o.   MRN: [id number]  HPI  Patient here for her physical exam.  She is followed by gyn.  Has uterine prolapse.  Pessary in place.  They had started her on estrogen cream.  States she started having right posterior lower leg discomfort.  She was concerned estrogen contributing.  No swelling or redness.  Stopped estrogen.  No pain or swelling now.  Overall feels good.  No chest pain.  Trying to stay active.  No sob.  No acid reflux.  No abdominal pain or cramping.  Bowels stable.  She is walking some.  Discussed diet and exercise.     Past Medical History:  Diagnosis Date  . Complication of anesthesia   . Degenerative disc disease, cervical    cervical  . Hypercholesterolemia   . Meralgia paraesthetica   . Migraines   . Nephrolithiasis   . Neurodermatitis   . Obesity   . Osteopenia   . PONV (postoperative nausea and vomiting)   . Vitamin D deficiency    Past Surgical History:  Procedure Laterality Date  . CHOLECYSTECTOMY N/A 04/24/2015   Procedure: LAPAROSCOPIC CHOLECYSTECTOMY WITH INTRAOPERATIVE CHOLANGIOGRAM;  Surgeon: Earline Mayotte, MD;  Location: [facility] OR[facility];  Service: General;  Laterality: N/A;  . EXTRACORPOREAL SHOCK WAVE LITHOTRIPSY Right 11/17/2014   Procedure: EXTRACORPOREAL SHOCK WAVE LITHOTRIPSY (ESWL);  Surgeon: Orson Ape, MD;  Location: [facility] ORS;  Service: Urology;  Laterality: Right;  . EXTRACORPOREAL SHOCK WAVE LITHOTRIPSY Left 12/15/2014   Procedure: EXTRACORPOREAL SHOCK WAVE LITHOTRIPSY (ESWL);  Surgeon: Orson Ape, MD;  Location: [facility] ORS;  Service: Urology;  Laterality: Left;  . laparoscopic surgery  1990   nephrolithiasis (Dr Orson Slick)  . LITHOTRIPSY  2010, 1980's  . pessary  2003, 2016   Family History  Problem Relation Age of Onset  . Hypertension Mother   . Hypertension Father   . Asthma Sister   .  Hypertension Sister   . Diabetes Sister   . Breast cancer Sister 21  . Rectal cancer Sister    Social History   Social History  . Marital status: Married    Spouse name: N/A  . Number of children: 1  . Years of education: N/A   Social History Main Topics  . Smoking status: Never Smoker  . Smokeless tobacco: Never Used  . Alcohol use No  . Drug use: No  . Sexual activity: Not Asked   Other Topics Concern  . None   Social History Narrative  . None    Outpatient Encounter Prescriptions as of 12/22/2015  Medication Sig  . conjugated estrogens (PREMARIN) vaginal cream Place 1 Applicatorful vaginally once a week.  . nystatin cream (MYCOSTATIN) Apply 1 application topically 2 (two) times daily as needed.  . Vitamin D, Ergocalciferol, (DRISDOL) 50000 units CAPS capsule TAKE 1 CAPSULE (50,000 UNITS TOTAL) BY MOUTH ONCE A WEEK.  . [DISCONTINUED] nystatin cream (MYCOSTATIN) Apply 1 application topically 2 (two) times daily. (Patient taking differently: Apply 1 application topically 2 (two) times daily as needed. )  . [DISCONTINUED] ondansetron (ZOFRAN ODT) 8 MG disintegrating tablet Take 1 tablet (8 mg total) by mouth every 6 (six) hours as needed for nausea or vomiting.  . [DISCONTINUED] ranitidine (ZANTAC) 75 MG tablet Take 7 mg by mouth.    No facility-administered encounter medications on file  as of 12/22/2015.     Review of Systems  Constitutional: Negative for appetite change and unexpected weight change.  HENT: Negative for congestion and sinus pressure.   Eyes: Negative for pain and visual disturbance.  Respiratory: Negative for cough, chest tightness and shortness of breath.   Cardiovascular: Negative for chest pain, palpitations and leg swelling.  Gastrointestinal: Negative for abdominal pain, diarrhea, nausea and vomiting.  Genitourinary: Negative for difficulty urinating and dysuria.  Musculoskeletal: Negative for back pain and joint swelling.  Skin: Negative for color  change and rash.  Neurological: Negative for dizziness, light-headedness and headaches.  Hematological: Negative for adenopathy. Does not bruise/bleed easily.  Psychiatric/Behavioral: Negative for agitation and dysphoric mood.       Objective:    Physical Exam  Constitutional: She is oriented to person, place, and time. She appears well-developed and well-nourished. No distress.  HENT:  Nose: Nose normal.  Mouth/Throat: Oropharynx is clear and moist.  Eyes: Right eye exhibits no discharge. Left eye exhibits no discharge. No scleral icterus.  Neck: Neck supple. No thyromegaly present.  Cardiovascular: Normal rate and regular rhythm.   Pulmonary/Chest: Breath sounds normal. No accessory muscle usage. No tachypnea. No respiratory distress. She has no decreased breath sounds. She has no wheezes. She has no rhonchi. Right breast exhibits no inverted nipple, no mass, no nipple discharge and no tenderness (no axillary adenopathy). Left breast exhibits no inverted nipple, no mass, no nipple discharge and no tenderness (no axilarry adenopathy).  Abdominal: Soft. Bowel sounds are normal. There is no tenderness.  Musculoskeletal: She exhibits no edema or tenderness.  No calf pain or swelling.   Lymphadenopathy:    She has no cervical adenopathy.  Neurological: She is alert and oriented to person, place, and time.  Skin: Skin is warm. No rash noted. No erythema.  Psychiatric: She has a normal mood and affect. Her behavior is normal.    BP 126/78   Pulse 77   Temp 98.3 F (36.8 C) (Oral)   Ht 5\' 6"  (1.676 m)   Wt 229 lb 3.2 oz (104 kg)   LMP 03/26/2003   SpO2 98%   BMI 36.99 kg/m  Wt Readings from Last 3 Encounters:  12/22/15 229 lb 3.2 oz (104 kg)  05/03/15 221 lb (100.2 kg)  04/13/15 225 lb (102.1 kg)     Lab Results  Component Value Date   WBC 6.3 12/21/2015   HGB 13.0 03/26/2015   HCT 37.9 12/21/2015   PLT 293 12/21/2015   GLUCOSE 86 12/21/2015   CHOL 186 12/21/2015   TRIG  56 12/21/2015   HDL 53 12/21/2015   LDLCALC 122 (H) 12/21/2015   ALT 24 12/21/2015   AST 23 12/21/2015   NA 144 12/21/2015   K 4.1 12/21/2015   CL 106 12/21/2015   CREATININE 0.76 12/21/2015   BUN 13 12/21/2015   TSH 1.970 12/21/2015   HGBA1C 5.8 (H) 12/21/2015    Dg Cholangiogram Operative  Result Date: 04/24/2015 CLINICAL DATA:  Cholecystectomy for cholelithiasis. EXAM: INTRAOPERATIVE CHOLANGIOGRAM TECHNIQUE: Cholangiographic images from the C-arm fluoroscopic device were submitted for interpretation post-operatively. Please see the procedural report for the amount of contrast and the fluoroscopy time utilized. COMPARISON:  Abdominal ultrasound on 04/04/2015 FINDINGS: Intraoperative images obtained with a C-arm demonstrate contrast injection into the cystic duct with opacification of the common bile duct and multiple intrahepatic ducts. The common bile duct is diffusely dilated. No discrete filling defects are identified. Intrahepatic ducts are of normal caliber. Contrast enters  the duodenum normally. There is mild reflux of contrast into the pancreatic duct. IMPRESSION: Diffuse common bile duct dilatation without discrete filling defects. Electronically Signed   By: Irish Lack M.D.   On: 04/24/2015 11:12       Assessment & Plan:   Problem List Items Addressed This Visit    Chronic cholecystitis with calculus    S/p gallbladder surgery.  Doing well.        Health care maintenance    Physical today 12/22/15.  Followed by gyn.  Mammogram 03/24/15 - birads I.  Colonoscopy 07/2012.        Hypercholesterolemia    Low cholesterol diet and exercise.  Follow lipid panel.   Lab Results  Component Value Date   CHOL 186 12/21/2015   HDL 53 12/21/2015   LDLCALC 122 (H) 12/21/2015   TRIG 56 12/21/2015   CHOLHDL 3.5 12/21/2015        Osteopenia    Weight bearing exercise.  Vitamin D supplements.        Thrombophlebitis    Has been followed by [facility].  Would  avoid estrogen therapy.        Vitamin D deficiency    Recent vitamin D level improved.  Continue supplements.  Will follow.        Other Visit Diagnoses   None.      Dale [place], MD

## 2015-12-22 NOTE — Progress Notes (Signed)
Pre visit review using our clinic review tool, if applicable. No additional management support is needed unless otherwise documented below in the visit note. 

## 2015-12-24 ENCOUNTER — Encounter: Payer: Self-pay | Admitting: Internal Medicine

## 2015-12-24 NOTE — Assessment & Plan Note (Signed)
Has been followed by [facility].  Would avoid estrogen therapy.

## 2015-12-24 NOTE — Assessment & Plan Note (Signed)
Physical today 12/22/15.  Followed by gyn.  Mammogram 03/24/15 - birads I.  Colonoscopy 07/2012.

## 2015-12-24 NOTE — Assessment & Plan Note (Signed)
Recent vitamin D level improved.  Continue supplements.  Will follow.

## 2015-12-24 NOTE — Assessment & Plan Note (Signed)
S/p gallbladder surgery.  Doing well.

## 2015-12-24 NOTE — Assessment & Plan Note (Signed)
Weight bearing exercise.  Vitamin D supplements.

## 2015-12-24 NOTE — Assessment & Plan Note (Signed)
Low cholesterol diet and exercise.  Follow lipid panel.   Lab Results  Component Value Date   CHOL 186 12/21/2015   HDL 53 12/21/2015   LDLCALC 122 (H) 12/21/2015   TRIG 56 12/21/2015   CHOLHDL 3.5 12/21/2015

## 2015-12-25 ENCOUNTER — Other Ambulatory Visit: Payer: Self-pay

## 2015-12-25 DIAGNOSIS — Z299 Encounter for prophylactic measures, unspecified: Secondary | ICD-10-CM

## 2015-12-25 NOTE — Progress Notes (Signed)
Patient came in to have a TB test for employment. She expressed that she will return to the clinic on Wednesday to have her skin test read.

## 2015-12-27 LAB — TB SKIN TEST
Induration: 0 mm
TB Skin Test: NEGATIVE

## 2016-01-16 ENCOUNTER — Other Ambulatory Visit: Payer: Self-pay | Admitting: Internal Medicine

## 2016-03-18 ENCOUNTER — Other Ambulatory Visit: Payer: Self-pay | Admitting: Internal Medicine

## 2016-03-18 DIAGNOSIS — Z1231 Encounter for screening mammogram for malignant neoplasm of breast: Secondary | ICD-10-CM

## 2016-04-05 ENCOUNTER — Ambulatory Visit
Admission: RE | Admit: 2016-04-05 | Discharge: 2016-04-05 | Disposition: A | Discharge status: Home / Self Care | Payer: Managed Care, Other (non HMO) | Source: Ambulatory Visit | Attending: Internal Medicine | Admitting: Internal Medicine

## 2016-04-05 DIAGNOSIS — Z1231 Encounter for screening mammogram for malignant neoplasm of breast: Secondary | ICD-10-CM | POA: Insufficient documentation

## 2016-06-24 ENCOUNTER — Telehealth: Payer: Self-pay

## 2016-06-24 NOTE — Telephone Encounter (Signed)
Fax Received from [facility] for medical clearance put in your blue folder for review.

## 2016-06-25 NOTE — Telephone Encounter (Signed)
When is surgery date.  The form did not have date of surgery.  She will need pre op appt.

## 2016-06-25 NOTE — Telephone Encounter (Signed)
[facility] in [facility] box for review.

## 2016-06-26 NOTE — Telephone Encounter (Signed)
I can see her at 12:00 on 07/11/16.

## 2016-06-26 NOTE — Telephone Encounter (Signed)
Spoke to patient she will not schedule surgery with out clearance. She also wanted to know how you felt bladder mesh. She has heard some bad things about it.

## 2016-06-26 NOTE — Telephone Encounter (Signed)
Reviewed notes.  Still does not mention date of surgery.  Notify pt - needs pre op evaluation.  Also need to know date of surgery so that know when to schedule pre op appt.

## 2016-06-27 NOTE — Telephone Encounter (Signed)
I have spoke to patient she is aware of app time and date. Can you put in schedule for me?

## 2016-06-28 ENCOUNTER — Telehealth: Payer: Self-pay | Admitting: Internal Medicine

## 2016-06-28 NOTE — Telephone Encounter (Signed)
Pt called and wants to know if she needs to have labs done for her appt on 3/28. Please advise, thank you!

## 2016-06-28 NOTE — Telephone Encounter (Signed)
Pt informed that it is a follow up to talk about upcomming surgery any labs needed will be ordered at that time by Dr. Lorin PicketScott,

## 2016-07-03 ENCOUNTER — Ambulatory Visit (INDEPENDENT_AMBULATORY_CARE_PROVIDER_SITE_OTHER): Payer: Managed Care, Other (non HMO) | Admitting: Internal Medicine

## 2016-07-03 ENCOUNTER — Encounter: Payer: Self-pay | Admitting: Internal Medicine

## 2016-07-03 VITALS — BP 124/72 | HR 82 | Temp 98.6°F | Ht 66.0 in | Wt 234.8 lb

## 2016-07-03 DIAGNOSIS — E7800 Pure hypercholesterolemia, unspecified: Secondary | ICD-10-CM | POA: Diagnosis not present

## 2016-07-03 DIAGNOSIS — Z01818 Encounter for other preprocedural examination: Secondary | ICD-10-CM | POA: Diagnosis not present

## 2016-07-03 DIAGNOSIS — M79673 Pain in unspecified foot: Secondary | ICD-10-CM

## 2016-07-03 NOTE — Progress Notes (Signed)
[name], female   DOB: 07-08-1950, 66 y.o.   MRN: [id number]   Subjective:    [name], female    DOB: [birth year], 66 y.o.   MRN: [id number]  HPI  Patient here for pre op evaluation.  She is planning to have surgery 09/10/16.  Seeing Dr Lula Olszewski.  She is scheduled for vaginal surgery for prolapse.  Was asked to come in for pre op evaluation.  States she is doing well.  No chest pain.  No sob.  No acid reflux.  No abdominal pain.  Bowels moving.  Still having issues with her foot.  Saw Dr Ether Griffins previously.  Increased pain.  Would like to be reevaluated.  Had questions regarding her surgery, etc.     Past Medical History:  Diagnosis Date  . Complication of anesthesia   . Degenerative disc disease, cervical    cervical  . Hypercholesterolemia   . Meralgia paraesthetica   . Migraines   . Nephrolithiasis   . Neurodermatitis   . Obesity   . Osteopenia   . PONV (postoperative nausea and vomiting)   . Vitamin D deficiency    Past Surgical History:  Procedure Laterality Date  . CHOLECYSTECTOMY N/A 04/24/2015   Procedure: LAPAROSCOPIC CHOLECYSTECTOMY WITH INTRAOPERATIVE CHOLANGIOGRAM;  Surgeon: Earline Mayotte, MD;  Location: [facility];  Service: General;  Laterality: N/A;  . EXTRACORPOREAL SHOCK WAVE LITHOTRIPSY Right 11/17/2014   Procedure: EXTRACORPOREAL SHOCK WAVE LITHOTRIPSY (ESWL);  Surgeon: Orson Ape, MD;  Location: [facility] OR[facility];  Service: Urology;  Laterality: Right;  . EXTRACORPOREAL SHOCK WAVE LITHOTRIPSY Left 12/15/2014   Procedure: EXTRACORPOREAL SHOCK WAVE LITHOTRIPSY (ESWL);  Surgeon: Orson Ape, MD;  Location: [facility] ORS;  Service: Urology;  Laterality: Left;  . laparoscopic surgery  1990   nephrolithiasis (Dr Orson Slick)  . LITHOTRIPSY  2010, 1980's  . pessary  2003, 2016   Family History  Problem Relation Age of Onset  . Hypertension Mother   . Hypertension Father   . Asthma Sister   . Hypertension Sister   . Diabetes Sister   . Breast  cancer Sister 70  . Rectal cancer Sister    Social History   Social History  . Marital status: Married    Spouse name: N/A  . Number of children: 1  . Years of education: N/A   Social History Main Topics  . Smoking status: Never Smoker  . Smokeless tobacco: Never Used  . Alcohol use No  . Drug use: No  . Sexual activity: Not Asked   Other Topics Concern  . None   Social History Narrative  . None    Outpatient Encounter Prescriptions as of 07/03/2016  Medication Sig  . conjugated estrogens (PREMARIN) vaginal cream Place 1 Applicatorful vaginally once a week.  . nystatin cream (MYCOSTATIN) Apply 1 application topically 2 (two) times daily as needed.  . Vitamin D, Ergocalciferol, (DRISDOL) 50000 units CAPS capsule TAKE 1 CAPSULE (50,000 UNITS TOTAL) BY MOUTH ONCE A WEEK.   No facility-administered encounter medications on file as of 07/03/2016.     Review of Systems  Constitutional: Negative for appetite change and unexpected weight change.  HENT: Negative for congestion and sinus pressure.   Respiratory: Negative for cough, chest tightness and shortness of breath.   Cardiovascular: Negative for chest pain, palpitations and leg swelling.  Gastrointestinal: Negative for abdominal pain, diarrhea, nausea and vomiting.  Genitourinary: Negative for difficulty urinating and dysuria.  Musculoskeletal: Negative for back pain and joint swelling.  Skin: Negative for color change and rash.  Neurological: Negative for dizziness, light-headedness and headaches.  Psychiatric/Behavioral: Negative for agitation and dysphoric mood.       Objective:     Blood pressure rechecked by me:  124/78  Physical Exam  Constitutional: She appears well-developed and well-nourished. No distress.  HENT:  Nose: Nose normal.  Mouth/Throat: Oropharynx is clear and moist.  Neck: Neck supple. No thyromegaly present.  Cardiovascular: Normal rate and regular rhythm.   Pulmonary/Chest: Breath sounds  normal. No respiratory distress. She has no wheezes.  Abdominal: Soft. Bowel sounds are normal. There is no tenderness.  Musculoskeletal: She exhibits no edema or tenderness.  Lymphadenopathy:    She has no cervical adenopathy.  Skin: No rash noted. No erythema.  Psychiatric: She has a normal mood and affect. Her behavior is normal.    BP 124/72 (BP Location: Left Arm, Patient Position: Sitting, Cuff Size: Large)   Pulse 82   Temp 98.6 F (37 C) (Oral)   Ht 5\' 6"  (1.676 m)   Wt 234 lb 12.8 oz (106.5 kg)   LMP 03/26/2003   SpO2 97%   BMI 37.90 kg/m  Wt Readings from Last 3 Encounters:  07/03/16 234 lb 12.8 oz (106.5 kg)  12/22/15 229 lb 3.2 oz (104 kg)  05/03/15 221 lb (100.2 kg)     Lab Results  Component Value Date   WBC 6.3 12/21/2015   HGB 13.0 03/26/2015   HCT 37.9 12/21/2015   PLT 293 12/21/2015   GLUCOSE 86 12/21/2015   CHOL 186 12/21/2015   TRIG 56 12/21/2015   HDL 53 12/21/2015   LDLCALC 122 (H) 12/21/2015   ALT 24 12/21/2015   AST 23 12/21/2015   NA 144 12/21/2015   K 4.1 12/21/2015   CL 106 12/21/2015   CREATININE 0.76 12/21/2015   BUN 13 12/21/2015   TSH 1.970 12/21/2015   HGBA1C 5.8 (H) 12/21/2015    Mm Screening Breast Tomo Bilateral  Result Date: 04/09/2016 CLINICAL DATA:  Screening. EXAM: 2D DIGITAL SCREENING BILATERAL MAMMOGRAM WITH CAD AND ADJUNCT TOMO COMPARISON:  Previous exam(s). ACR Breast Density Category b: There are scattered areas of fibroglandular density. FINDINGS: There are no findings suspicious for malignancy. Images were processed with CAD. IMPRESSION: No mammographic evidence of malignancy. A result letter of this screening mammogram will be mailed directly to the patient. RECOMMENDATION: Screening mammogram in one year. (Code:SM-B-01Y) BI-RADS CATEGORY  1: Negative. Electronically Signed   By: Dalphine HandingErin  Shaw M.D.   On: 04/09/2016 19:03       Assessment & Plan:   Problem List Items Addressed This Visit    Foot pain    Persistent  foot pain as outlined.  Request referral back to Dr Ether GriffinsFowler.        Hypercholesterolemia    Low cholesterol diet and exercise.  Follow lipid panel.        Pre-op evaluation    No chest pain.  No sob.  EKG - SR no acute ischemic changes.  Feel she is at low risk from cardiac standpoint to proceed with surgery.  Will need close intra op and post op monitoring of her heart rate and blood pressure to avoid extremes.         Other Visit Diagnoses    Pre-op exam    -  Primary   Relevant Orders   EKG 12-Lead (Completed)       Dale DurhamSCOTT, Luevenia Mcavoy, MD

## 2016-07-03 NOTE — Progress Notes (Signed)
Pre-visit discussion using our clinic review tool. No additional management support is needed unless otherwise documented below in the visit note.  

## 2016-07-06 ENCOUNTER — Encounter: Payer: Self-pay | Admitting: Internal Medicine

## 2016-07-06 DIAGNOSIS — Z01818 Encounter for other preprocedural examination: Secondary | ICD-10-CM | POA: Insufficient documentation

## 2016-07-06 DIAGNOSIS — M79673 Pain in unspecified foot: Secondary | ICD-10-CM | POA: Insufficient documentation

## 2016-07-06 NOTE — Assessment & Plan Note (Signed)
Persistent foot pain as outlined.  Request referral back to Dr Ether Griffins.

## 2016-07-06 NOTE — Assessment & Plan Note (Signed)
No chest pain.  No sob.  EKG - SR no acute ischemic changes.  Feel she is at low risk from cardiac standpoint to proceed with surgery.  Will need close intra op and post op monitoring of her heart rate and blood pressure to avoid extremes.

## 2016-07-06 NOTE — Assessment & Plan Note (Signed)
Low cholesterol diet and exercise.  Follow lipid panel.   

## 2016-07-11 ENCOUNTER — Ambulatory Visit: Payer: Self-pay | Admitting: Internal Medicine

## 2016-07-15 ENCOUNTER — Telehealth: Payer: Self-pay

## 2016-07-15 NOTE — Telephone Encounter (Signed)
rx written for labs and placed in your box.

## 2016-07-15 NOTE — Telephone Encounter (Signed)
Called patient per fax received from [facility] we need to order BMP and CBC (Dr. Neil Crouch) Fax results to [phone]. She would like to have order sent to [employer].

## 2016-07-16 NOTE — Telephone Encounter (Signed)
Lab order faxed called patient and let her know she also wanted to know if you had heard of anyone who had the bladder surgery without doing a hysterectomy as well.

## 2016-07-16 NOTE — Telephone Encounter (Signed)
Called patient she will call back and give [place] fax number for office we need to send order to.

## 2016-07-16 NOTE — Telephone Encounter (Signed)
Yes.  It depends on what is going on with each individual.

## 2016-07-18 NOTE — Telephone Encounter (Signed)
Left message to return call to our office.  

## 2016-07-19 NOTE — Telephone Encounter (Signed)
Patient informed will call if any further questions.

## 2016-07-29 ENCOUNTER — Other Ambulatory Visit: Payer: Self-pay

## 2016-07-29 DIAGNOSIS — Z299 Encounter for prophylactic measures, unspecified: Secondary | ICD-10-CM

## 2016-07-29 NOTE — Progress Notes (Signed)
Patient came in to have blood drawn for testing per Dr. Tullo's orders. 

## 2016-07-30 ENCOUNTER — Telehealth: Payer: Self-pay

## 2016-07-30 LAB — CBC WITH DIFFERENTIAL/PLATELET
Basophils Absolute: 0 10*3/uL (ref 0.0–0.2)
Basos: 0 %
EOS (ABSOLUTE): 0.1 10*3/uL (ref 0.0–0.4)
Eos: 2 %
Hematocrit: 39.6 % (ref 34.0–46.6)
Hemoglobin: 12.9 g/dL (ref 11.1–15.9)
Immature Grans (Abs): 0 10*3/uL (ref 0.0–0.1)
Immature Granulocytes: 0 %
Lymphocytes Absolute: 2.1 10*3/uL (ref 0.7–3.1)
Lymphs: 36 %
MCH: 29.6 pg (ref 26.6–33.0)
MCHC: 32.6 g/dL (ref 31.5–35.7)
MCV: 91 fL (ref 79–97)
Monocytes Absolute: 0.3 10*3/uL (ref 0.1–0.9)
Monocytes: 5 %
Neutrophils Absolute: 3.3 10*3/uL (ref 1.4–7.0)
Neutrophils: 57 %
Platelets: 321 10*3/uL (ref 150–379)
RBC: 4.36 x10E6/uL (ref 3.77–5.28)
RDW: 14.3 % (ref 12.3–15.4)
WBC: 5.8 10*3/uL (ref 3.4–10.8)

## 2016-07-30 LAB — BASIC METABOLIC PANEL
BUN/Creatinine Ratio: 18 (ref 12–28)
BUN: 14 mg/dL (ref 8–27)
CO2: 27 mmol/L (ref 18–29)
Calcium: 9.4 mg/dL (ref 8.7–10.3)
Chloride: 104 mmol/L (ref 96–106)
Creatinine, Ser: 0.76 mg/dL (ref 0.57–1.00)
GFR calc Af Amer: 96 mL/min/{1.73_m2} (ref >59)
GFR calc non Af Amer: 83 mL/min/{1.73_m2} (ref >59)
Glucose: 101 mg/dL — ABNORMAL HIGH (ref 65–99)
Potassium: 4.8 mmol/L (ref 3.5–5.2)
Sodium: 144 mmol/L (ref 134–144)

## 2016-07-30 NOTE — Telephone Encounter (Signed)
Pt called back returning your call. Thank you!  Call pt @ [phone]

## 2016-07-30 NOTE — Telephone Encounter (Signed)
-----   Message from Dale McGregor, MD sent at 07/30/2016  1:21 PM EDT ----- Notify pt that her kidney function tests and hgb are wnl.  She had requested these per her surgeon for pre op labs.  Need to know where to forward labs.

## 2016-07-30 NOTE — Telephone Encounter (Signed)
Patient advised of results and verbalized understanding .  Patient states we should forward labs to Dr [facility] office .  Did you complete a pre-op form?   If not I can just fax labs to Dr [facility] office.  Thanks.

## 2016-07-30 NOTE — Telephone Encounter (Signed)
Ok.  Please fax labs.  See lab result note.

## 2016-07-30 NOTE — Telephone Encounter (Signed)
Left message to return call to our office.  

## 2016-07-30 NOTE — Telephone Encounter (Signed)
The fax Number for Dr. Lula Olszewski fax number is [phone]

## 2016-07-30 NOTE — Telephone Encounter (Signed)
Notes faxed.

## 2016-09-27 ENCOUNTER — Ambulatory Visit (INDEPENDENT_AMBULATORY_CARE_PROVIDER_SITE_OTHER): Payer: Managed Care, Other (non HMO) | Admitting: Family Medicine

## 2016-09-27 ENCOUNTER — Encounter: Payer: Self-pay | Admitting: Family Medicine

## 2016-09-27 VITALS — BP 120/74 | HR 96 | Temp 98.7°F | Wt 223.4 lb

## 2016-09-27 DIAGNOSIS — R509 Fever, unspecified: Secondary | ICD-10-CM

## 2016-09-27 DIAGNOSIS — R8290 Unspecified abnormal findings in urine: Secondary | ICD-10-CM

## 2016-09-27 LAB — POCT URINALYSIS DIPSTICK
Glucose, UA: 100
Ketones, UA: 15
Nitrite, UA: NEGATIVE
Protein, UA: 100
Spec Grav, UA: 1.015 (ref 1.010–1.025)
Urobilinogen, UA: >=8 E.U./dL — AB
pH, UA: 6.5 (ref 5.0–8.0)

## 2016-09-27 LAB — URINALYSIS, MICROSCOPIC ONLY

## 2016-09-27 NOTE — Patient Instructions (Signed)
Nice to meet you. We will send the your urine for culture. You should continue to monitor your temperature and if it does not continue to stay down or continue to improve let us know. Please monitor your headache and your shoulder discomfort. If they worsen or he develop any numbness or weakness, vision changes, worsening headache, or you develop any new symptoms such as abdominal pain, discharge, or other symptoms please seek medical attention immediately. If your symptoms overall are not improving by early or mid next week please let us know.

## 2016-09-27 NOTE — Assessment & Plan Note (Signed)
Patient with fever in the postoperative phase. Temperature has come down and has not been in the febrile range recently. She has no specific symptoms to point to a cause of her fever. She does have some trapezius and shoulder discomfort on the left that could be musculoskeletal. She does note some left-sided headache though has had no other upper respiratory symptoms. Potentially could be tension related to her trapezius discomfort. She has no neurological symptoms. She has no vision changes or tenderness of her temples. Vision check on right side different than left though she reports chronic issues with her right eye related to a cataract. She overall has a benign exam today. No findings consistent with DVT or PE. Lung exam is normal and vital signs are stable and thus doubt VTE or pneumonia as cause. Benign abdominal exam. Unlikely intra-abdominal process. Urine was collected and it did have leukocytes and possible blood though she has no urine symptoms. Could be contamination. We'll send for culture and microscopy. Discussed Tylenol and ibuprofen as needed. Discussed monitoring her temperature given that it has improved from prior. Discussed that if she does not continue to improve by middle of next week or if her headaches persist we should consider imaging and further evaluation. She's given return precautions.

## 2016-09-27 NOTE — Progress Notes (Signed)
Marikay AlarEric Kastin Cerda, MD Phone: [phone]  Nicole Hardy is a 66 y.o. female who presents today for same-day visit.  Patient presents with complaint of fever. Notes she had a vaginal hysterectomy and reconstructive repair on June 5. She did well after discharge up until 09/17/16 when she developed a temperature of 101.11F. She spoke with the surgeon's office and they advised monitoring her symptoms as she had no abdominal pain or signs of UTI. Since then her MAXIMUM TEMPERATURE is 100.34F. Has been fluctuating in the mid 25F range to 100.34F. She does note some left trapezius discomfort that started after sleeping. Can hurt with movement though other time she just hurts. Has actually improved to some degree as it was quite severe initially. She does note left-sided headache as well. This has been going on since several days after her surgery. They had advised her on taking Tylenol and then she tried Excedrin Migraine which has been beneficial. Notes the headache has gotten somewhat better. She notes no eye pain or vision changes. She notes no numbness or weakness. She notes no rhinorrhea or sinus congestion. She's had no cough. She had no vomiting. She does have some loose stools though she has been on MiraLAX. She has no abdominal pain. No urinary symptoms. No rash. No history of DVT. No unilateral or bilateral leg swelling. No calf pain. No hemoptysis. No shortness of breath. No pleuritic symptoms. She does report she sees an eye doctor once yearly. She notes her right eye has always been worse than her left and has been advised that she has a cataract in that eye.  PMH: nonsmoker.   ROS see history of present illness  Objective  Physical Exam Vitals:   09/27/16 0956  BP: 120/74  Pulse: 96  Temp: 98.7 F (37.1 C)    BP Readings from Last 3 Encounters:  09/27/16 120/74  07/03/16 124/72  12/22/15 126/78   Wt Readings from Last 3 Encounters:  09/27/16 223 lb 6.4 oz (101.3 kg)  07/03/16 234  lb 12.8 oz (106.5 kg)  12/22/15 229 lb 3.2 oz (104 kg)    Physical Exam  Constitutional: No distress.  HENT:  Head: Normocephalic and atraumatic.  Mouth/Throat: Oropharynx is clear and moist. No oropharyngeal exudate.  Normal TMs bilaterally  Eyes: Conjunctivae are normal. Pupils are equal, round, and reactive to light.  Neck: Neck supple.  Cardiovascular: Normal rate, regular rhythm and normal heart sounds.   Pulmonary/Chest: Effort normal and breath sounds normal.  Abdominal: Soft. Bowel sounds are normal. She exhibits no distension. There is no tenderness. There is no rebound and no guarding.  Genitourinary:  Genitourinary Comments: External labia normal, normal vaginal opening, no apparent discharge at the vaginal opening, no erythema noted, internal vaginal exam deferred given she has just had surgery and was advised not to have anything placed in her vagina until follow-up with the surgeon  Musculoskeletal: She exhibits no edema.  No midline neck tenderness, no midline neck step off, there is intermittent discomfort on palpation of the left trapezius in the midportion, no underlying skin changes, no discomfort on palpation of the scapula  Lymphadenopathy:    She has no cervical adenopathy.  Neurological: She is alert.  CN 2-12 intact, 5/5 strength in bilateral biceps, triceps, grip, quads, hamstrings, plantar and dorsiflexion, sensation to light touch intact in bilateral UE and LE, normal gait  Skin: Skin is warm and dry. She is not diaphoretic.   no tenderness over bilateral temples, no prominent vasculature of her bilateral  temples Bilateral calves with no swelling, tenderness, or cords  Assessment/Plan: Please see individual problem list.  Fever Patient with fever in the postoperative phase. Temperature has come down and has not been in the febrile range recently. She has no specific symptoms to point to a cause of her fever. She does have some trapezius and shoulder discomfort  on the left that could be musculoskeletal. She does note some left-sided headache though has had no other upper respiratory symptoms. Potentially could be tension related to her trapezius discomfort. She has no neurological symptoms. She has no vision changes or tenderness of her temples. Vision check on right side different than left though she reports chronic issues with her right eye related to a cataract. She overall has a benign exam today. No findings consistent with DVT or PE. Lung exam is normal and vital signs are stable and thus doubt VTE or pneumonia as cause. Benign abdominal exam. Unlikely intra-abdominal process. Urine was collected and it did have leukocytes and possible blood though she has no urine symptoms. Could be contamination. We'll send for culture and microscopy. Discussed Tylenol and ibuprofen as needed. Discussed monitoring her temperature given that it has improved from prior. Discussed that if she does not continue to improve by middle of next week or if her headaches persist we should consider imaging and further evaluation. She's given return precautions.   Orders Placed This Encounter  Procedures  . Urine Culture  . Urine Microscopic Only  . POCT Urinalysis Dipstick   Marikay Alar, MD [facility] [place]

## 2016-09-28 LAB — URINE CULTURE: Organism ID, Bacteria: NO GROWTH

## 2016-11-20 ENCOUNTER — Ambulatory Visit: Payer: Self-pay | Admitting: Physician Assistant

## 2016-11-20 DIAGNOSIS — Z299 Encounter for prophylactic measures, unspecified: Secondary | ICD-10-CM

## 2016-11-20 NOTE — Progress Notes (Signed)
Patient came in to have her scheduled annual TB Skin Test.

## 2016-11-22 ENCOUNTER — Other Ambulatory Visit: Payer: Self-pay

## 2016-11-22 DIAGNOSIS — Z299 Encounter for prophylactic measures, unspecified: Secondary | ICD-10-CM

## 2016-11-22 LAB — READ PPD: TB Skin Test: NEGATIVE

## 2016-11-22 NOTE — Progress Notes (Signed)
Patient came in to have her TB skin test read.

## 2016-12-21 ENCOUNTER — Other Ambulatory Visit: Payer: Self-pay | Admitting: Internal Medicine

## 2017-01-06 ENCOUNTER — Telehealth: Payer: Self-pay | Admitting: *Deleted

## 2017-01-06 NOTE — Telephone Encounter (Signed)
duplicate

## 2017-01-06 NOTE — Telephone Encounter (Signed)
Called lab and patient let her know that lab order has been faxed.

## 2017-01-06 NOTE — Telephone Encounter (Signed)
Please advise 

## 2017-01-06 NOTE — Telephone Encounter (Signed)
Patient has requested to know if she will need labs prior to her visit on Oct 3 for her physical Pt has labs done at [facility] and will need an answer today  Pt contact [phone]

## 2017-01-06 NOTE — Telephone Encounter (Signed)
rx written and placed in box 

## 2017-01-06 NOTE — Telephone Encounter (Signed)
Patient called back let her know that I would send you the message and as soon as I received response I would call. Pt does not get labs here.

## 2017-01-06 NOTE — Telephone Encounter (Signed)
Pt stated if labs Are needed please fax the orders over to [phone]- [facility]

## 2017-01-08 ENCOUNTER — Ambulatory Visit (INDEPENDENT_AMBULATORY_CARE_PROVIDER_SITE_OTHER): Payer: Managed Care, Other (non HMO) | Admitting: Internal Medicine

## 2017-01-08 ENCOUNTER — Encounter: Payer: Self-pay | Admitting: Internal Medicine

## 2017-01-08 VITALS — BP 136/82 | HR 75 | Temp 98.4°F | Resp 14 | Wt 227.0 lb

## 2017-01-08 DIAGNOSIS — Z0001 Encounter for general adult medical examination with abnormal findings: Secondary | ICD-10-CM | POA: Diagnosis not present

## 2017-01-08 DIAGNOSIS — M8580 Other specified disorders of bone density and structure, unspecified site: Secondary | ICD-10-CM

## 2017-01-08 DIAGNOSIS — Z0000 Encounter for general adult medical examination without abnormal findings: Secondary | ICD-10-CM

## 2017-01-08 DIAGNOSIS — E559 Vitamin D deficiency, unspecified: Secondary | ICD-10-CM

## 2017-01-08 DIAGNOSIS — M25512 Pain in left shoulder: Secondary | ICD-10-CM | POA: Diagnosis not present

## 2017-01-08 DIAGNOSIS — E7800 Pure hypercholesterolemia, unspecified: Secondary | ICD-10-CM | POA: Diagnosis not present

## 2017-01-08 NOTE — Progress Notes (Signed)
[name], female   DOB: 08/02/1950, 66 y.o.   MRN: [id number]   Subjective:    [name], female    DOB: Apr 14, 1950, 66 y.o.   MRN: [id number]  HPI  Patient here for a scheduled physical.   She is accompanied by her husband.  She reports she is having problems with her left shoulder.  Some increased pain.  Hurts with certain movements.  No chest pain.  No sob.  No acid reflux.  No abdominal pain.  Bowels moving.  miralax helps.  S/p TVH.  Doing well.  Seeing gyn.    Past Medical History:  Diagnosis Date  . Complication of anesthesia   . Degenerative disc disease, cervical    cervical  . Hypercholesterolemia   . Meralgia paraesthetica   . Migraines   . Nephrolithiasis   . Neurodermatitis   . Obesity   . Osteopenia   . PONV (postoperative nausea and vomiting)   . Vitamin D deficiency    Past Surgical History:  Procedure Laterality Date  . CHOLECYSTECTOMY N/A 04/24/2015   Procedure: LAPAROSCOPIC CHOLECYSTECTOMY WITH INTRAOPERATIVE CHOLANGIOGRAM;  Surgeon: Earline Mayotte, MD;  Location: [facility] ORS;  Service: General;  Laterality: N/A;  . EXTRACORPOREAL SHOCK WAVE LITHOTRIPSY Right 11/17/2014   Procedure: EXTRACORPOREAL SHOCK WAVE LITHOTRIPSY (ESWL);  Surgeon: Orson Ape, MD;  Location: [facility] ORS;  Service: Urology;  Laterality: Right;  . EXTRACORPOREAL SHOCK WAVE LITHOTRIPSY Left 12/15/2014   Procedure: EXTRACORPOREAL SHOCK WAVE LITHOTRIPSY (ESWL);  Surgeon: Orson Ape, MD;  Location: [facility] ORS;  Service: Urology;  Laterality: Left;  . laparoscopic surgery  1990   nephrolithiasis (Dr Orson Slick)  . LITHOTRIPSY  2010, 1980's  . pessary  2003, 2016   Family History  Problem Relation Age of Onset  . Hypertension Mother   . Hypertension Father   . Asthma Sister   . Hypertension Sister   . Diabetes Sister   . Breast cancer Sister 19  . Rectal cancer Sister    Social History   Social History  . Marital status: Married    Spouse name: N/A  . Number  of children: 1  . Years of education: N/A   Social History Main Topics  . Smoking status: Never Smoker  . Smokeless tobacco: Never Used  . Alcohol use No  . Drug use: No  . Sexual activity: Not Asked   Other Topics Concern  . None   Social History Narrative  . None    Outpatient Encounter Prescriptions as of 01/08/2017  Medication Sig  . acetaminophen (TYLENOL) 325 MG tablet Take 650 mg by mouth.  Marland Kitchen ibuprofen (ADVIL,MOTRIN) 600 MG tablet TAKE 1 TABLET (600 MG TOTAL) BY MOUTH EVERY SIX (6) HOURS AS NEEDED FOR PAIN.  Marland Kitchen nystatin cream (MYCOSTATIN) Apply 1 application topically 2 (two) times daily as needed.  . polyethylene glycol (MIRALAX / GLYCOLAX) packet Take 17 g by mouth daily.  . Vitamin D, Ergocalciferol, (DRISDOL) 50000 units CAPS capsule TAKE 1 CAPSULE (50,000 UNITS TOTAL) BY MOUTH ONCE A WEEK.  . [DISCONTINUED] oxyCODONE (OXY IR/ROXICODONE) 5 MG immediate release tablet Take 5 mg by mouth.   No facility-administered encounter medications on file as of 01/08/2017.     Review of Systems  Constitutional: Negative for appetite change and unexpected weight change.  HENT: Negative for congestion and sinus pressure.   Eyes: Negative for pain and visual disturbance.  Respiratory: Negative for cough, chest tightness and shortness of breath.   Cardiovascular: Negative for  chest pain, palpitations and leg swelling.  Gastrointestinal: Negative for abdominal pain, diarrhea, nausea and vomiting.  Genitourinary: Negative for difficulty urinating and dysuria.  Musculoskeletal: Negative for back pain.       Left shoulder pain as outlined.    Skin: Negative for color change and rash.  Neurological: Negative for dizziness and headaches.  Hematological: Negative for adenopathy. Does not bruise/bleed easily.  Psychiatric/Behavioral: Negative for agitation and dysphoric mood.       Objective:    Physical Exam  Constitutional: She appears well-developed and well-nourished. No distress.   HENT:  Nose: Nose normal.  Mouth/Throat: Oropharynx is clear and moist.  Neck: Neck supple. No thyromegaly present.  Cardiovascular: Normal rate and regular rhythm.   Pulmonary/Chest: Breath sounds normal. No respiratory distress. She has no wheezes.  Abdominal: Soft. Bowel sounds are normal. There is no tenderness.  Musculoskeletal: She exhibits no edema or tenderness.  Lymphadenopathy:    She has no cervical adenopathy.  Neurological: She is alert.  Skin: No rash noted. No erythema.  Psychiatric: She has a normal mood and affect. Her behavior is normal.    BP 136/82 (BP Location: Left Arm, Patient Position: Sitting, Cuff Size: Normal)   Pulse 75   Temp 98.4 F (36.9 C) (Oral)   Resp 14   Wt 227 lb (103 kg)   LMP 09/10/2016 (Exact Date)   BMI 36.64 kg/m  Wt Readings from Last 3 Encounters:  01/08/17 227 lb (103 kg)  09/27/16 223 lb 6.4 oz (101.3 kg)  07/03/16 234 lb 12.8 oz (106.5 kg)     Lab Results  Component Value Date   WBC 5.8 07/29/2016   HGB 12.9 07/29/2016   HCT 39.6 07/29/2016   PLT 321 07/29/2016   GLUCOSE 101 (H) 07/29/2016   CHOL 186 12/21/2015   TRIG 56 12/21/2015   HDL 53 12/21/2015   LDLCALC 122 (H) 12/21/2015   ALT 24 12/21/2015   AST 23 12/21/2015   NA 144 07/29/2016   K 4.8 07/29/2016   CL 104 07/29/2016   CREATININE 0.76 07/29/2016   BUN 14 07/29/2016   CO2 27 07/29/2016   TSH 1.970 12/21/2015   HGBA1C 5.8 (H) 12/21/2015    Mm Screening Breast Tomo Bilateral  Result Date: 04/09/2016 CLINICAL DATA:  Screening. EXAM: 2D DIGITAL SCREENING BILATERAL MAMMOGRAM WITH CAD AND ADJUNCT TOMO COMPARISON:  Previous exam(s). ACR Breast Density Category b: There are scattered areas of fibroglandular density. FINDINGS: There are no findings suspicious for malignancy. Images were processed with CAD. IMPRESSION: No mammographic evidence of malignancy. A result letter of this screening mammogram will be mailed directly to the patient. RECOMMENDATION:  Screening mammogram in one year. (Code:SM-B-01Y) BI-RADS CATEGORY  1: Negative. Electronically Signed   By: Dalphine Handing M.D.   On: 04/09/2016 19:03       Assessment & Plan:   Problem List Items Addressed This Visit    Health care maintenance    Physical 01/08/17.  Followed by gyn.  Mammogram 04/09/16 - birads I.  Will need to schedule f/u mammogram.  Colonoscopy 07/2012.        Hypercholesterolemia    Low cholesterol diet and exercise.  Follow lipid panel.        Osteopenia    Weight bearing exercise.  Follow vitamin D level.  Dietary calcium.       Vitamin D deficiency    Follow vitamin D level.         Other Visit Diagnoses  Left shoulder pain, unspecified chronicity    -  Primary   Persistent.  denies injury.  will xray.  she wants to check with her work to see if can get xray there.  will f/u here if needs xray here.         Dale Potter, MD

## 2017-01-09 ENCOUNTER — Encounter: Payer: Self-pay | Admitting: Internal Medicine

## 2017-01-11 ENCOUNTER — Encounter: Payer: Self-pay | Admitting: Internal Medicine

## 2017-01-11 NOTE — Assessment & Plan Note (Signed)
Physical 01/08/17.  Followed by gyn.  Mammogram 04/09/16 - birads I.  Will need to schedule f/u mammogram.  Colonoscopy 07/2012.

## 2017-01-11 NOTE — Assessment & Plan Note (Signed)
Weight bearing exercise.  Follow vitamin D level.  Dietary calcium.

## 2017-01-11 NOTE — Assessment & Plan Note (Signed)
Follow vitamin D level.  

## 2017-01-11 NOTE — Assessment & Plan Note (Signed)
Low cholesterol diet and exercise.  Follow lipid panel.   

## 2017-01-15 ENCOUNTER — Other Ambulatory Visit: Payer: Self-pay

## 2017-01-15 DIAGNOSIS — Z299 Encounter for prophylactic measures, unspecified: Secondary | ICD-10-CM

## 2017-01-15 NOTE — Progress Notes (Signed)
Patient came in to have blood drawn for testing per Dr. Charlene Scott's orders. 

## 2017-01-16 LAB — CMP12+LP+TP+TSH+6AC+CBC/D/PLT
ALT: 14 IU/L (ref 0–32)
AST: 12 IU/L (ref 0–40)
Albumin/Globulin Ratio: 1.3 (ref 1.2–2.2)
Albumin: 4 g/dL (ref 3.6–4.8)
Alkaline Phosphatase: 78 IU/L (ref 39–117)
BUN/Creatinine Ratio: 17 (ref 12–28)
BUN: 13 mg/dL (ref 8–27)
Basophils Absolute: 0 10*3/uL (ref 0.0–0.2)
Basos: 0 %
Bilirubin Total: 0.5 mg/dL (ref 0.0–1.2)
Calcium: 9.2 mg/dL (ref 8.7–10.3)
Chloride: 106 mmol/L (ref 96–106)
Chol/HDL Ratio: 3.6 ratio (ref 0.0–4.4)
Cholesterol, Total: 188 mg/dL (ref 100–199)
Creatinine, Ser: 0.77 mg/dL (ref 0.57–1.00)
EOS (ABSOLUTE): 0.1 10*3/uL (ref 0.0–0.4)
Eos: 1 %
Estimated CHD Risk: 0.6 times avg. (ref 0.0–1.0)
Free Thyroxine Index: 1.5 (ref 1.2–4.9)
GFR calc Af Amer: 94 mL/min/{1.73_m2} (ref >59)
GFR calc non Af Amer: 82 mL/min/{1.73_m2} (ref >59)
GGT: 10 IU/L (ref 0–60)
Globulin, Total: 3.2 g/dL (ref 1.5–4.5)
Glucose: 92 mg/dL (ref 65–99)
HDL: 52 mg/dL (ref >39)
Hematocrit: 39.4 % (ref 34.0–46.6)
Hemoglobin: 12.6 g/dL (ref 11.1–15.9)
Immature Grans (Abs): 0 10*3/uL (ref 0.0–0.1)
Immature Granulocytes: 0 %
Iron: 53 ug/dL (ref 27–139)
LDH: 196 IU/L (ref 119–226)
LDL Calculated: 125 mg/dL — ABNORMAL HIGH (ref 0–99)
Lymphocytes Absolute: 1.9 10*3/uL (ref 0.7–3.1)
Lymphs: 37 %
MCH: 29 pg (ref 26.6–33.0)
MCHC: 32 g/dL (ref 31.5–35.7)
MCV: 91 fL (ref 79–97)
Monocytes Absolute: 0.3 10*3/uL (ref 0.1–0.9)
Monocytes: 5 %
Neutrophils Absolute: 2.9 10*3/uL (ref 1.4–7.0)
Neutrophils: 57 %
Phosphorus: 3.4 mg/dL (ref 2.5–4.5)
Platelets: 273 10*3/uL (ref 150–379)
Potassium: 4.1 mmol/L (ref 3.5–5.2)
RBC: 4.34 x10E6/uL (ref 3.77–5.28)
RDW: 14 % (ref 12.3–15.4)
Sodium: 144 mmol/L (ref 134–144)
T3 Uptake Ratio: 25 % (ref 24–39)
T4, Total: 6.1 ug/dL (ref 4.5–12.0)
TSH: 2.17 u[IU]/mL (ref 0.450–4.500)
Total Protein: 7.2 g/dL (ref 6.0–8.5)
Triglycerides: 56 mg/dL (ref 0–149)
Uric Acid: 4 mg/dL (ref 2.5–7.1)
VLDL Cholesterol Cal: 11 mg/dL (ref 5–40)
WBC: 5.1 10*3/uL (ref 3.4–10.8)

## 2017-01-16 LAB — HGB A1C W/O EAG: Hgb A1c MFr Bld: 5.4 % (ref 4.8–5.6)

## 2017-01-16 LAB — VITAMIN D 25 HYDROXY (VIT D DEFICIENCY, FRACTURES): Vit D, 25-Hydroxy: 19.9 ng/mL — ABNORMAL LOW (ref 30.0–100.0)

## 2017-01-17 ENCOUNTER — Telehealth: Payer: Self-pay | Admitting: Internal Medicine

## 2017-01-17 NOTE — Telephone Encounter (Signed)
Pt called back returning your call. Please advise, thank you!@  Call pt @ [phone]

## 2017-01-20 NOTE — Telephone Encounter (Signed)
L/m to call office see lab results for documentation.

## 2017-01-22 NOTE — Telephone Encounter (Signed)
Pt called back returning your call. Please advise, thank you! °

## 2017-01-22 NOTE — Telephone Encounter (Signed)
See lab results for documentation.  

## 2017-03-24 ENCOUNTER — Other Ambulatory Visit: Payer: Self-pay | Admitting: Internal Medicine

## 2017-03-24 DIAGNOSIS — Z1231 Encounter for screening mammogram for malignant neoplasm of breast: Secondary | ICD-10-CM

## 2017-04-14 ENCOUNTER — Ambulatory Visit: Payer: Self-pay | Admitting: Internal Medicine

## 2017-04-15 ENCOUNTER — Ambulatory Visit
Admission: RE | Admit: 2017-04-15 | Discharge: 2017-04-15 | Disposition: A | Discharge status: Home / Self Care | Payer: Managed Care, Other (non HMO) | Source: Ambulatory Visit | Attending: Internal Medicine | Admitting: Internal Medicine

## 2017-04-15 DIAGNOSIS — Z1231 Encounter for screening mammogram for malignant neoplasm of breast: Secondary | ICD-10-CM

## 2017-05-12 ENCOUNTER — Telehealth: Payer: Self-pay | Admitting: Internal Medicine

## 2017-05-12 DIAGNOSIS — N200 Calculus of kidney: Secondary | ICD-10-CM

## 2017-05-12 DIAGNOSIS — E7800 Pure hypercholesterolemia, unspecified: Secondary | ICD-10-CM

## 2017-05-12 DIAGNOSIS — E559 Vitamin D deficiency, unspecified: Secondary | ICD-10-CM

## 2017-05-12 DIAGNOSIS — R739 Hyperglycemia, unspecified: Secondary | ICD-10-CM

## 2017-05-12 NOTE — Telephone Encounter (Signed)
LMTCB

## 2017-05-12 NOTE — Telephone Encounter (Signed)
I can put in the routine orders that I prescribe, but I am not sure what labs she needs regarding a "kidney stone".  See her message.

## 2017-05-12 NOTE — Telephone Encounter (Signed)
Patient is needing orders placed for labs, if I need to call for more info just let me know.

## 2017-05-12 NOTE — Telephone Encounter (Signed)
Please advise  Copied from CRM [id number]. Topic: General - Other >> May 12, 2017 10:40 AM Debroah LoopLander, Lumin L wrote: Reason for CRM: Patient is supposed to have labs at [facility] that were ordered by Dr. Lorin PicketScott. She wants to make sure the orders have been placed which should include checking for kidney stones. Her appt is already scheduled for this Wednesday 05/14/2017. I don't see any orders. Please advise her once it's done.

## 2017-05-13 NOTE — Telephone Encounter (Signed)
Pt says that she has a Hx of kidney stones, pt says that she THOUGHT that there is a test that can show if she have kidney stones coming, pt says that she is just trying to be  proactive to have any labs that she may need. Pt says that she feels perfectly fine. Pt says that she just want to make sure that she have all the labs that she may need, completed.

## 2017-05-13 NOTE — Telephone Encounter (Signed)
Please advise 

## 2017-05-13 NOTE — Telephone Encounter (Signed)
FYI

## 2017-05-13 NOTE — Telephone Encounter (Signed)
Copied from CRM [id number]. Topic: General - Other >> May 13, 2017 10:25 AM Herby AbrahamJohnson, Nicole C wrote: Pt says that she has a Hx of kidney stones, pt says that she THOUGHT that there is a test that can show if she have kidney stones coming, pt says that she is just trying to be  proactive to have any labs that she may need. Pt says that she feels perfectly fine. Pt says that she just want to make sure that she have all the labs that she may need, completed.     CB: [phone] (cell)

## 2017-05-13 NOTE — Telephone Encounter (Signed)
Copied from CRM [id number]. Topic: General - Other >> May 12, 2017 10:40 AM Debroah LoopLander, Lumin L wrote: Reason for CRM: Patient is supposed to have labs at [facility] that were ordered by Dr. Lorin PicketScott. She wants to make sure the orders have been placed which should include checking for kidney stones. Her appt is already scheduled for this Wednesday 05/14/2017. I don't see any orders. Please advise her once it's done.  >> May 13, 2017 10:25 AM Herby AbrahamJohnson, Shiquita C wrote: Pt says that she has a Hx of kidney stones, pt says that she THOUGHT that there is a test that can show if she have kidney stones coming, pt says that she is just trying to be  proactive to have any labs that she may need. Pt says that she feels perfectly fine. Pt says that she just want to make sure that she have all the labs that she may need, completed.     CB: [phone] (cell)

## 2017-05-14 ENCOUNTER — Other Ambulatory Visit: Payer: Self-pay

## 2017-05-14 DIAGNOSIS — Z299 Encounter for prophylactic measures, unspecified: Secondary | ICD-10-CM

## 2017-05-14 NOTE — Telephone Encounter (Signed)
Orders placed for labs at [facility] 

## 2017-05-14 NOTE — Progress Notes (Signed)
Patient came in to have blood drawn for testing per Dr. Charlene Scott's orders. 

## 2017-05-14 NOTE — Telephone Encounter (Signed)
Left detailed message for patient letting her know that orders for labs have been placed.

## 2017-05-15 LAB — CMP12+LP+TP+TSH+6AC+CBC/D/PLT
ALT: 17 IU/L (ref 0–32)
AST: 18 IU/L (ref 0–40)
Albumin/Globulin Ratio: 1.1 — ABNORMAL LOW (ref 1.2–2.2)
Albumin: 3.7 g/dL (ref 3.6–4.8)
Alkaline Phosphatase: 78 IU/L (ref 39–117)
BUN/Creatinine Ratio: 16 (ref 12–28)
BUN: 12 mg/dL (ref 8–27)
Bilirubin Total: 0.3 mg/dL (ref 0.0–1.2)
Calcium: 8.9 mg/dL (ref 8.7–10.3)
Chloride: 106 mmol/L (ref 96–106)
Chol/HDL Ratio: 3.8 ratio (ref 0.0–4.4)
Cholesterol, Total: 173 mg/dL (ref 100–199)
Creatinine, Ser: 0.77 mg/dL (ref 0.57–1.00)
Estimated CHD Risk: 0.7 times avg. (ref 0.0–1.0)
Free Thyroxine Index: 1.4 (ref 1.2–4.9)
GFR calc Af Amer: 94 mL/min/1.73
GFR calc non Af Amer: 82 mL/min/1.73
GGT: 8 IU/L (ref 0–60)
Globulin, Total: 3.3 g/dL (ref 1.5–4.5)
Glucose: 109 mg/dL — ABNORMAL HIGH (ref 65–99)
HDL: 46 mg/dL
Iron: 41 ug/dL (ref 27–139)
LDH: 194 IU/L (ref 119–226)
LDL Calculated: 114 mg/dL — ABNORMAL HIGH (ref 0–99)
Phosphorus: 3.3 mg/dL (ref 2.5–4.5)
Potassium: 4 mmol/L (ref 3.5–5.2)
Sodium: 143 mmol/L (ref 134–144)
T3 Uptake Ratio: 25 % (ref 24–39)
T4, Total: 5.4 ug/dL (ref 4.5–12.0)
TSH: 3.54 u[IU]/mL (ref 0.450–4.500)
Total Protein: 7 g/dL (ref 6.0–8.5)
Triglycerides: 65 mg/dL (ref 0–149)
Uric Acid: 3.9 mg/dL (ref 2.5–7.1)
VLDL Cholesterol Cal: 13 mg/dL (ref 5–40)

## 2017-05-15 LAB — URINALYSIS, ROUTINE W REFLEX MICROSCOPIC
Bilirubin, UA: NEGATIVE
Glucose, UA: NEGATIVE
Ketones, UA: NEGATIVE
Leukocytes, UA: NEGATIVE
Nitrite, UA: NEGATIVE
Protein, UA: NEGATIVE
RBC, UA: NEGATIVE
Specific Gravity, UA: 1.015 (ref 1.005–1.030)
Urobilinogen, Ur: 0.2 mg/dL (ref 0.2–1.0)
pH, UA: 7 (ref 5.0–7.5)

## 2017-05-15 LAB — HGB A1C W/O EAG

## 2017-05-15 LAB — VITAMIN D 25 HYDROXY (VIT D DEFICIENCY, FRACTURES): Vit D, 25-Hydroxy: 26.1 ng/mL — ABNORMAL LOW (ref 30.0–100.0)

## 2017-05-22 ENCOUNTER — Ambulatory Visit (INDEPENDENT_AMBULATORY_CARE_PROVIDER_SITE_OTHER): Payer: Managed Care, Other (non HMO) | Admitting: Internal Medicine

## 2017-05-22 ENCOUNTER — Encounter: Payer: Self-pay | Admitting: Internal Medicine

## 2017-05-22 DIAGNOSIS — M79673 Pain in unspecified foot: Secondary | ICD-10-CM | POA: Diagnosis not present

## 2017-05-22 DIAGNOSIS — M8580 Other specified disorders of bone density and structure, unspecified site: Secondary | ICD-10-CM

## 2017-05-22 DIAGNOSIS — E559 Vitamin D deficiency, unspecified: Secondary | ICD-10-CM | POA: Diagnosis not present

## 2017-05-22 DIAGNOSIS — E7800 Pure hypercholesterolemia, unspecified: Secondary | ICD-10-CM | POA: Diagnosis not present

## 2017-05-22 DIAGNOSIS — N8110 Cystocele, unspecified: Secondary | ICD-10-CM | POA: Diagnosis not present

## 2017-05-22 NOTE — Progress Notes (Signed)
[name], female   DOB: Sep 29, 1950, 67 y.o.   MRN: [id number]   Subjective:    [name], female    DOB: 01/19/1951, 67 y.o.   MRN: [id number]  HPI  Patient here for a scheduled follow up.  Is s/p TVH, USLS and SSLS, combined anterior and posterior repair, TVT midurethral sling and cystoscopy.  Seeing Dr Lula Olszewski.  Just evaluated 05/01/17.  Peassary.  Recommended f/u in 10-12 weeks.  She is frustrated.  Still requires a pessary.  Discussed with her today.  States otherwise, she is doing well.  No chest pain.  No sob.  No acid reflux.  No abdominal pain.  Bowels moving.  Discussed recent lab results.  Discussed calculated cholesterol risk.  (7%).  Overall she feels she is handling stress.  Foot pain limits some of her activity.  Has seen podiatry.     Past Medical History:  Diagnosis Date  . Complication of anesthesia   . Degenerative disc disease, cervical    cervical  . Hypercholesterolemia   . Meralgia paraesthetica   . Migraines   . Nephrolithiasis   . Neurodermatitis   . Obesity   . Osteopenia   . PONV (postoperative nausea and vomiting)   . Vitamin D deficiency    Past Surgical History:  Procedure Laterality Date  . CHOLECYSTECTOMY N/A 04/24/2015   Procedure: LAPAROSCOPIC CHOLECYSTECTOMY WITH INTRAOPERATIVE CHOLANGIOGRAM;  Surgeon: Earline Mayotte, MD;  Location: [facility] ORS;  Service: General;  Laterality: N/A;  . EXTRACORPOREAL SHOCK WAVE LITHOTRIPSY Right 11/17/2014   Procedure: EXTRACORPOREAL SHOCK WAVE LITHOTRIPSY (ESWL);  Surgeon: Orson Ape, MD;  Location: [facility] ORS;  Service: Urology;  Laterality: Right;  . EXTRACORPOREAL SHOCK WAVE LITHOTRIPSY Left 12/15/2014   Procedure: EXTRACORPOREAL SHOCK WAVE LITHOTRIPSY (ESWL);  Surgeon: Orson Ape, MD;  Location: [facility] ORS;  Service: Urology;  Laterality: Left;  . laparoscopic surgery  1990   nephrolithiasis (Dr Orson Slick)  . LITHOTRIPSY  2010, 1980's  . pessary  2003, 2016   Family History  Problem  Relation Age of Onset  . Hypertension Mother   . Hypertension Father   . Asthma Sister   . Hypertension Sister   . Diabetes Sister   . Breast cancer Sister 58  . Rectal cancer Sister    Social History   Socioeconomic History  . Marital status: Married    Spouse name: None  . Number of children: 1  . Years of education: None  . Highest education level: None  Social Needs  . Financial resource strain: None  . Food insecurity - worry: None  . Food insecurity - inability: None  . Transportation needs - medical: None  . Transportation needs - non-medical: None  Occupational History  . None  Tobacco Use  . Smoking status: Never Smoker  . Smokeless tobacco: Never Used  Substance and Sexual Activity  . Alcohol use: No    Alcohol/week: 0.0 oz  . Drug use: No  . Sexual activity: None  Other Topics Concern  . None  Social History Narrative  . None    Outpatient Encounter Medications as of 05/22/2017  Medication Sig  . acetaminophen (TYLENOL) 325 MG tablet Take 650 mg by mouth.  Marland Kitchen ibuprofen (ADVIL,MOTRIN) 600 MG tablet TAKE 1 TABLET (600 MG TOTAL) BY MOUTH EVERY SIX (6) HOURS AS NEEDED FOR PAIN.  Marland Kitchen nystatin cream (MYCOSTATIN) Apply 1 application topically 2 (two) times daily as needed.  . polyethylene glycol (MIRALAX / GLYCOLAX) packet Take 17  g by mouth daily.  . Vitamin D, Ergocalciferol, (DRISDOL) 50000 units CAPS capsule TAKE 1 CAPSULE (50,000 UNITS TOTAL) BY MOUTH ONCE A WEEK.   No facility-administered encounter medications on file as of 05/22/2017.     Review of Systems  Constitutional: Negative for appetite change and unexpected weight change.  HENT: Negative for congestion and sinus pressure.   Respiratory: Negative for cough, chest tightness and shortness of breath.   Cardiovascular: Negative for chest pain, palpitations and leg swelling.  Gastrointestinal: Negative for abdominal pain, diarrhea, nausea and vomiting.  Genitourinary: Negative for difficulty urinating  and dysuria.  Musculoskeletal: Negative for joint swelling and myalgias.  Skin: Negative for color change and rash.  Neurological: Negative for dizziness, light-headedness and headaches.  Psychiatric/Behavioral: Negative for agitation and dysphoric mood.       Objective:    Physical Exam  Constitutional: She appears well-developed and well-nourished. No distress.  HENT:  Nose: Nose normal.  Mouth/Throat: Oropharynx is clear and moist.  Neck: Neck supple. No thyromegaly present.  Cardiovascular: Normal rate and regular rhythm.  Pulmonary/Chest: Breath sounds normal. No respiratory distress. She has no wheezes.  Abdominal: Soft. Bowel sounds are normal. There is no tenderness.  Musculoskeletal: She exhibits no edema or tenderness.  Lymphadenopathy:    She has no cervical adenopathy.  Skin: No rash noted. No erythema.  Psychiatric: She has a normal mood and affect. Her behavior is normal.    BP 128/82 (BP Location: Left Arm, Patient Position: Sitting, Cuff Size: Large)   Pulse 81   Temp 98.1 F (36.7 C) (Oral)   Resp 18   Wt 235 lb 6.4 oz (106.8 kg)   LMP 09/10/2016 (Exact Date)   SpO2 97%   BMI 37.99 kg/m  Wt Readings from Last 3 Encounters:  05/22/17 235 lb 6.4 oz (106.8 kg)  01/08/17 227 lb (103 kg)  09/27/16 223 lb 6.4 oz (101.3 kg)     Lab Results  Component Value Date   WBC CANCELED 05/14/2017   HGB CANCELED 05/14/2017   HCT CANCELED 05/14/2017   PLT CANCELED 05/14/2017   GLUCOSE 109 (H) 05/14/2017   CHOL 173 05/14/2017   TRIG 65 05/14/2017   HDL 46 05/14/2017   LDLCALC 114 (H) 05/14/2017   ALT 17 05/14/2017   AST 18 05/14/2017   NA 143 05/14/2017   K 4.0 05/14/2017   CL 106 05/14/2017   CREATININE 0.77 05/14/2017   BUN 12 05/14/2017   CO2 27 07/29/2016   TSH 3.540 05/14/2017   HGBA1C CANCELED 05/14/2017    Mm Screening Breast Tomo Bilateral  Result Date: 04/15/2017 CLINICAL DATA:  Screening. EXAM: 2D DIGITAL SCREENING BILATERAL MAMMOGRAM WITH 3D  TOMO WITH CAD COMPARISON:  Previous exam(s). ACR Breast Density Category b: There are scattered areas of fibroglandular density. FINDINGS: There are no findings suspicious for malignancy. Images were processed with CAD. IMPRESSION: No mammographic evidence of malignancy. A result letter of this screening mammogram will be mailed directly to the patient. RECOMMENDATION: Screening mammogram in one year. (Code:SM-B-01Y) BI-RADS CATEGORY  1: Negative. Electronically Signed   By: Edwin CapJennifer  Jarosz M.D.   On: 04/15/2017 14:31       Assessment & Plan:   Problem List Items Addressed This Visit    Foot pain    Persistent.  Has seen podiatry.  Desires no further intervention.  Follow.       Hypercholesterolemia    Discussed recent lab results.  LDL 125.  Calculated risk - 7%.  Hold on  medication.  Low cholesterol diet and exercise.  Follow  Lipid panel.        Osteopenia    Weight bearing exercise.  Follow vitamin D level.  Last check improved.  Continue supplements.  Follow.  Dietary calcium.        Vaginal prolapse    Persistent bulging s/p recent surgery.  Being followed by Dr Lula Olszewski.  Just evaluated.        Vitamin D deficiency    Recent vitamin D level improved.  Continue supplements.  Follow.           Dale Rockleigh, MD

## 2017-05-25 ENCOUNTER — Encounter: Payer: Self-pay | Admitting: Internal Medicine

## 2017-05-25 DIAGNOSIS — N8110 Cystocele, unspecified: Secondary | ICD-10-CM | POA: Insufficient documentation

## 2017-05-25 NOTE — Assessment & Plan Note (Signed)
Weight bearing exercise.  Follow vitamin D level.  Last check improved.  Continue supplements.  Follow.  Dietary calcium.

## 2017-05-25 NOTE — Assessment & Plan Note (Signed)
Discussed recent lab results.  LDL 125.  Calculated risk - 7%.  Hold on medication.  Low cholesterol diet and exercise.  Follow  Lipid panel.

## 2017-05-25 NOTE — Assessment & Plan Note (Signed)
Persistent.  Has seen podiatry.  Desires no further intervention.  Follow.

## 2017-05-25 NOTE — Assessment & Plan Note (Signed)
Recent vitamin D level improved.  Continue supplements.  Follow.

## 2017-05-25 NOTE — Assessment & Plan Note (Signed)
Persistent bulging s/p recent surgery.  Being followed by Dr Lula Olszewskionnolly.  Just evaluated.

## 2017-07-24 IMAGING — MG MM DIGITAL SCREENING BILAT W/ CAD
5 series · 5 of 5 positions shown · non-contrast
Comparison: Previous exam(s).

CLINICAL DATA: Screening.

EXAM:
DIGITAL SCREENING BILATERAL MAMMOGRAM WITH CAD

[L MLO]
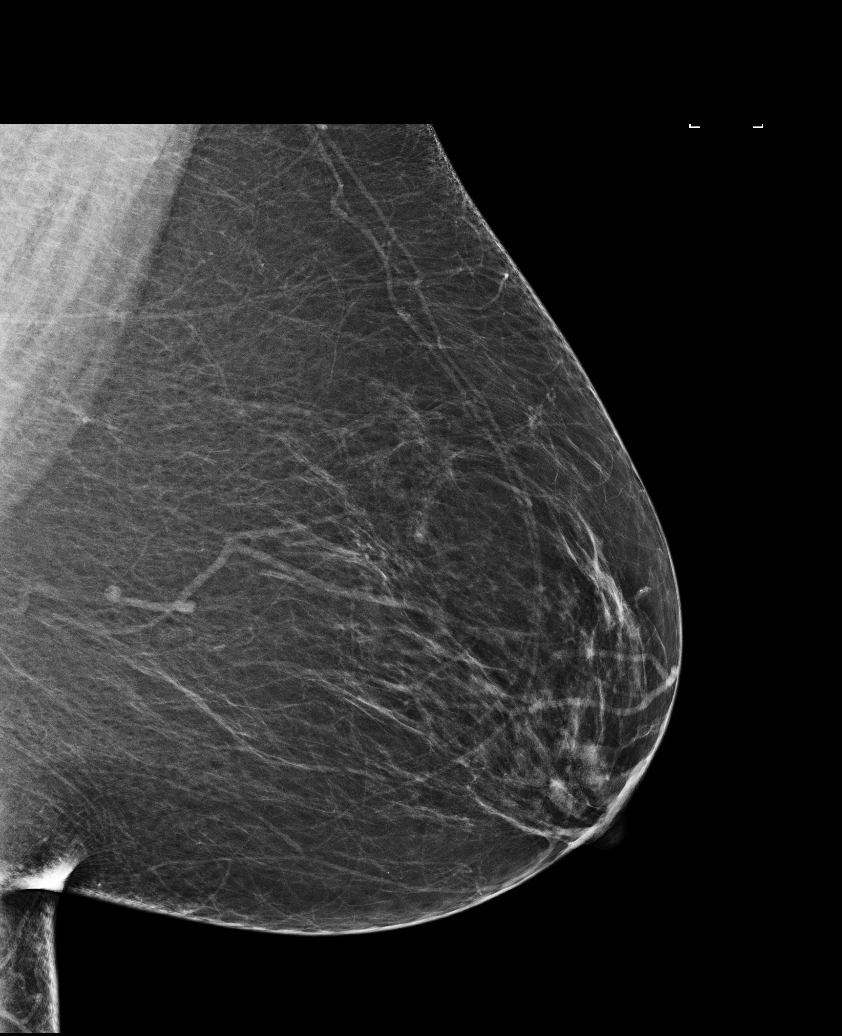

[R MLO (1 of 2)]
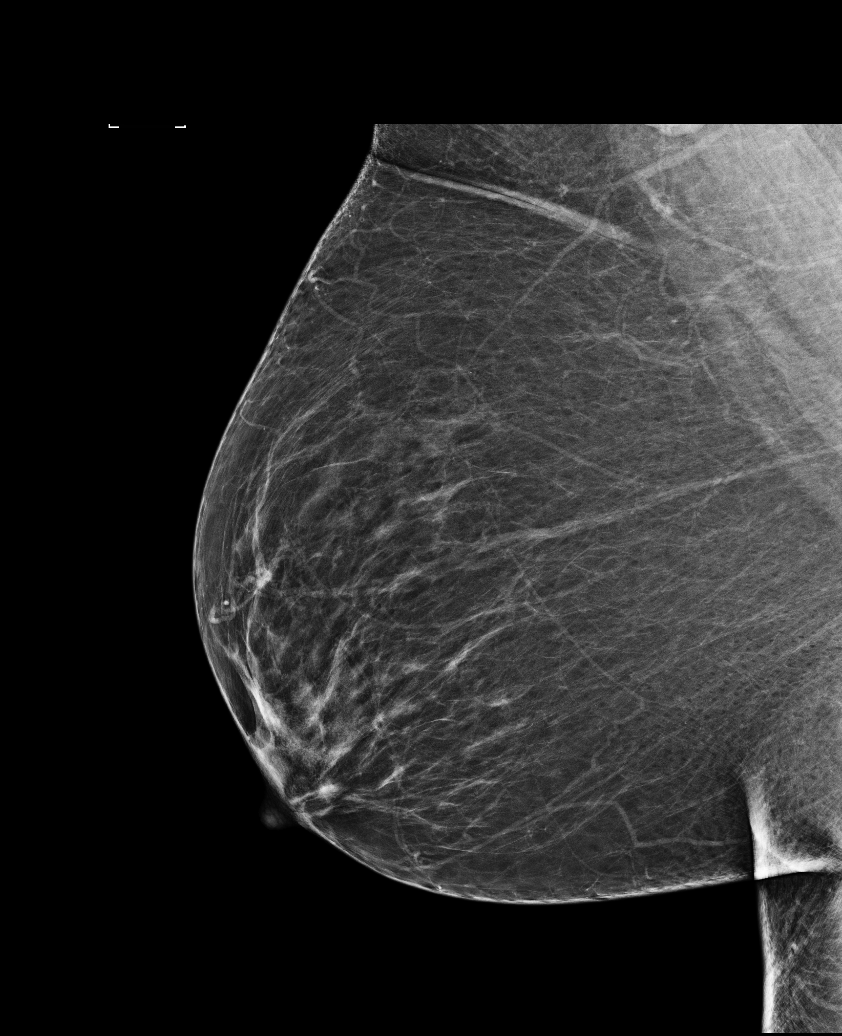

[R CC]
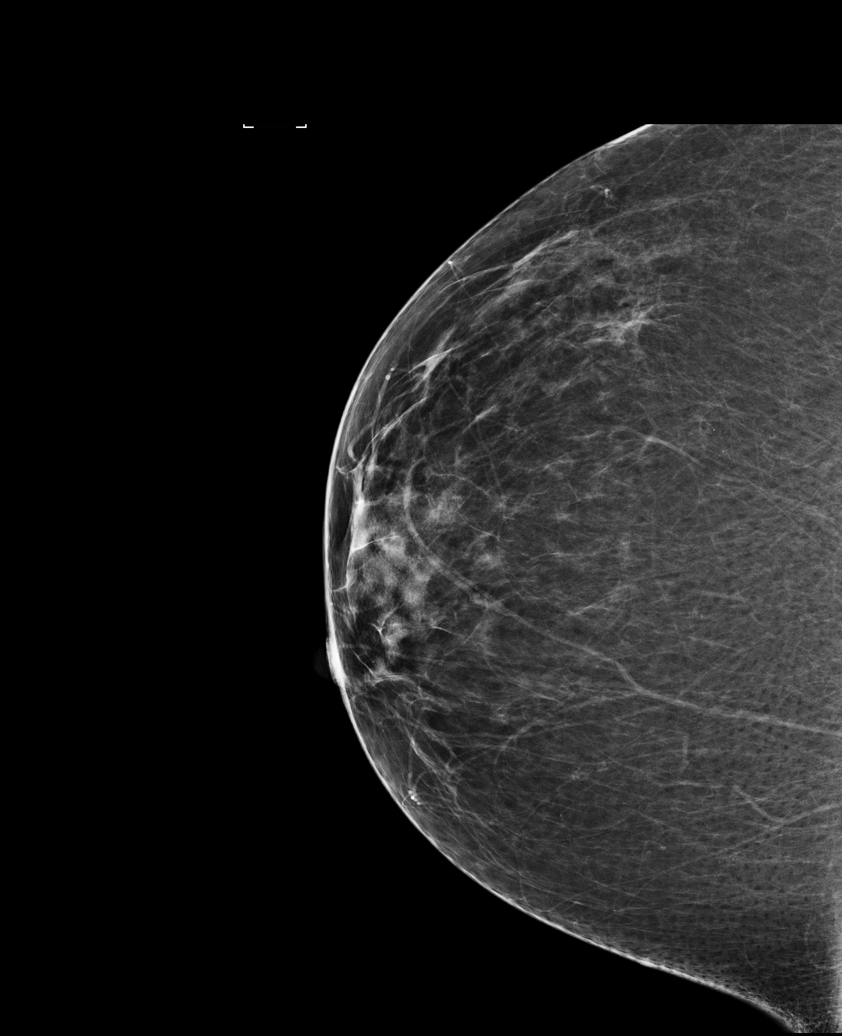

[R MLO (2 of 2)]
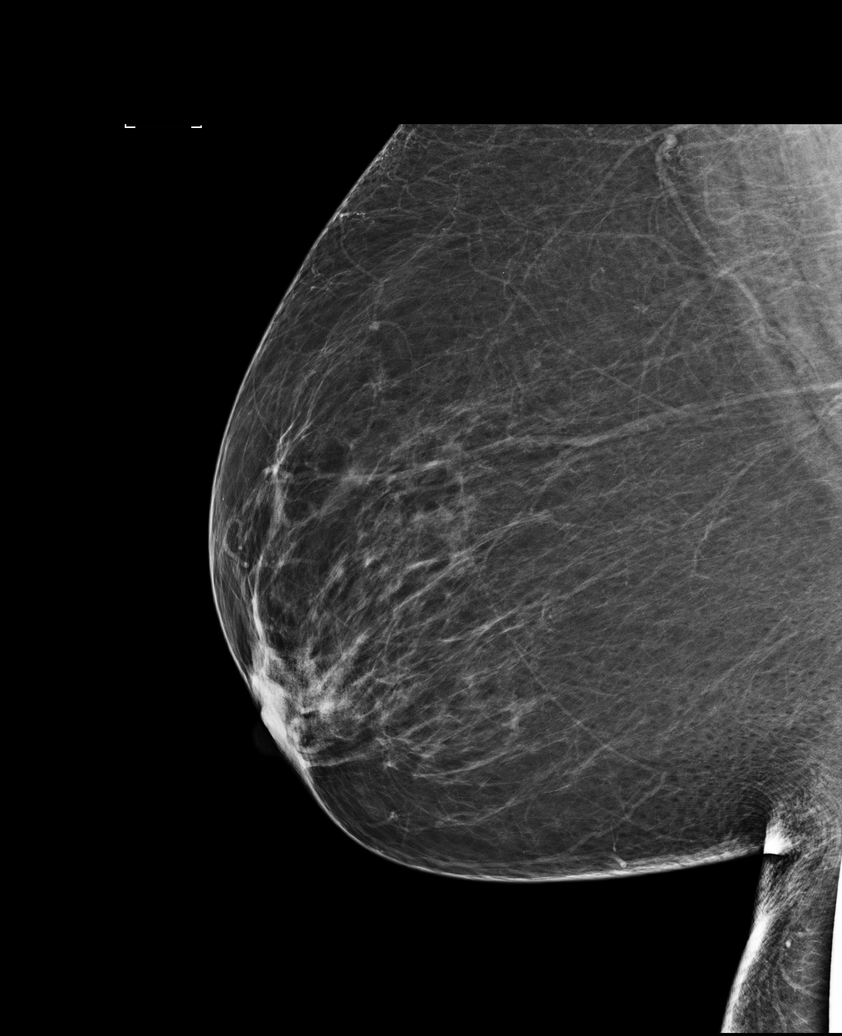

[L CC]
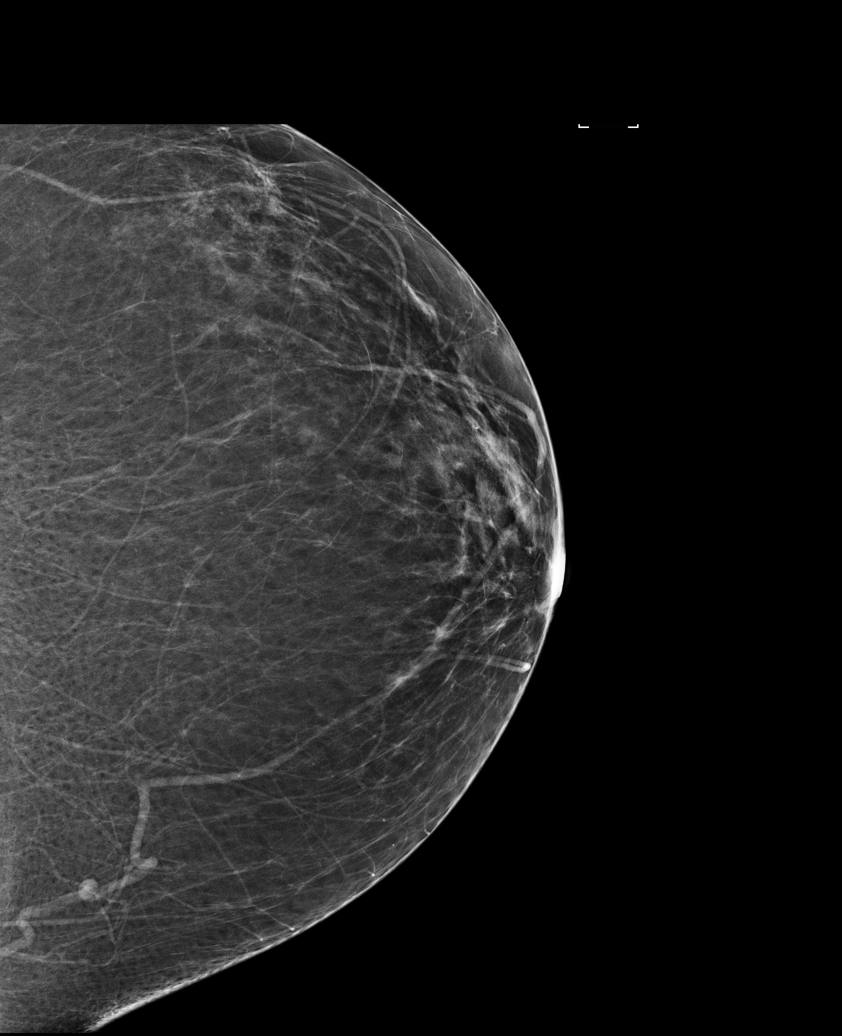

[5 of 5 positions shown; findings below may reference images not displayed]

ACR Breast Density Category b: There are scattered areas of
fibroglandular density.
FINDINGS: There are no findings suspicious for malignancy. Images were
processed with CAD.
IMPRESSION: No mammographic evidence of malignancy. A result letter of this
screening mammogram will be mailed directly to the patient.

RECOMMENDATION:
Screening mammogram in one year. (Code:AS-G-LCT)

BI-RADS CATEGORY  1: Negative.

## 2017-09-15 ENCOUNTER — Telehealth: Payer: Self-pay

## 2017-09-15 NOTE — Telephone Encounter (Signed)
Copied from CRM [phone]#[id number]. Topic: Quick Communication - See Telephone Encounter >> Sep 15, 2017  2:46 PM Herby AbrahamJohnson, Shiquita C wrote: CRM for notification. See Telephone encounter for: 09/15/17.  [phone] (cell) -pt has an apt with PCP this week, she would like to know if labs are needed for this visit? If so, pt says that she have her labs completed at a different location. Please advise.

## 2017-09-16 NOTE — Telephone Encounter (Signed)
Does pt need labs before her visit?

## 2017-09-16 NOTE — Telephone Encounter (Signed)
Please let her know that I was not in the office.  If she is able to get labs prior to appt, yes I would like to have these so we can go over together at her appt.  Where does she want to get drawn?

## 2017-09-16 NOTE — Telephone Encounter (Signed)
[facility] would [facility]. But she would like to know since she had labs done in February does she need to have them done again? Please call patient back with answer, thanks.

## 2017-09-16 NOTE — Telephone Encounter (Signed)
Left message for patient to call back and let me know where she wants labs drawn

## 2017-09-17 ENCOUNTER — Other Ambulatory Visit: Payer: Self-pay | Admitting: Internal Medicine

## 2017-09-17 DIAGNOSIS — E559 Vitamin D deficiency, unspecified: Secondary | ICD-10-CM

## 2017-09-17 DIAGNOSIS — E7800 Pure hypercholesterolemia, unspecified: Secondary | ICD-10-CM

## 2017-09-17 DIAGNOSIS — R739 Hyperglycemia, unspecified: Secondary | ICD-10-CM

## 2017-09-17 NOTE — Telephone Encounter (Signed)
Pt calling back and stated that the doctor at [facility] can do her blood work today but are not sure if the results will be ready by tomorrow at 9am due to labs being sent out to [facility]. Please fax lab request to them at [phone].

## 2017-09-17 NOTE — Telephone Encounter (Signed)
Please place orders for labs. Patient is calling to see if they can draw labs today so she can have results tomorrow

## 2017-09-17 NOTE — Telephone Encounter (Signed)
These lab [facility] are in to [facility].  Do they need to be [facility].

## 2017-09-17 NOTE — Telephone Encounter (Signed)
It looks like Dr. Lorin PicketScott has already put [facility]. FYI

## 2017-09-17 NOTE — Telephone Encounter (Signed)
Pt cancelled appt

## 2017-09-17 NOTE — Telephone Encounter (Signed)
Orders faxed

## 2017-09-17 NOTE — Progress Notes (Signed)
Orders placed for labs to be [facility].  Do I need to put them in as [facility] or can I leave them as they are.  Just need to make sure they can see them.

## 2017-09-18 ENCOUNTER — Ambulatory Visit: Payer: Self-pay | Admitting: Internal Medicine

## 2017-10-02 DIAGNOSIS — N819 Female genital prolapse, unspecified: Secondary | ICD-10-CM | POA: Diagnosis not present

## 2017-10-30 ENCOUNTER — Other Ambulatory Visit: Payer: Self-pay

## 2017-10-30 DIAGNOSIS — E7800 Pure hypercholesterolemia, unspecified: Secondary | ICD-10-CM

## 2017-10-30 DIAGNOSIS — R739 Hyperglycemia, unspecified: Secondary | ICD-10-CM

## 2017-10-30 NOTE — Addendum Note (Signed)
Addended by: Dollene PrimroseBURKE, Mylie Mccurley on: 10/30/2017 08:25 AM   Modules accepted: Orders

## 2017-10-31 LAB — BASIC METABOLIC PANEL
BUN/Creatinine Ratio: 14 (ref 12–28)
BUN: 11 mg/dL (ref 8–27)
CO2: 24 mmol/L (ref 20–29)
Calcium: 9.4 mg/dL (ref 8.7–10.3)
Chloride: 106 mmol/L (ref 96–106)
Creatinine, Ser: 0.81 mg/dL (ref 0.57–1.00)
GFR calc Af Amer: 88 mL/min/{1.73_m2} (ref >59)
GFR calc non Af Amer: 76 mL/min/{1.73_m2} (ref >59)
Glucose: 98 mg/dL (ref 65–99)
Potassium: 4.3 mmol/L (ref 3.5–5.2)
Sodium: 143 mmol/L (ref 134–144)

## 2017-10-31 LAB — CBC WITH DIFFERENTIAL/PLATELET
Basophils Absolute: 0 10*3/uL (ref 0.0–0.2)
Basos: 0 %
EOS (ABSOLUTE): 0.1 10*3/uL (ref 0.0–0.4)
Eos: 2 %
Hematocrit: 39 % (ref 34.0–46.6)
Hemoglobin: 12.9 g/dL (ref 11.1–15.9)
Immature Grans (Abs): 0 10*3/uL (ref 0.0–0.1)
Immature Granulocytes: 0 %
Lymphocytes Absolute: 1.7 10*3/uL (ref 0.7–3.1)
Lymphs: 37 %
MCH: 29.5 pg (ref 26.6–33.0)
MCHC: 33.1 g/dL (ref 31.5–35.7)
MCV: 89 fL (ref 79–97)
Monocytes Absolute: 0.2 10*3/uL (ref 0.1–0.9)
Monocytes: 5 %
Neutrophils Absolute: 2.6 10*3/uL (ref 1.4–7.0)
Neutrophils: 56 %
Platelets: 296 10*3/uL (ref 150–450)
RBC: 4.38 x10E6/uL (ref 3.77–5.28)
RDW: 14.1 % (ref 12.3–15.4)
WBC: 4.6 10*3/uL (ref 3.4–10.8)

## 2017-10-31 LAB — LIPID PANEL
Chol/HDL Ratio: 4.2 ratio (ref 0.0–4.4)
Cholesterol, Total: 196 mg/dL (ref 100–199)
HDL: 47 mg/dL (ref >39)
LDL Calculated: 132 mg/dL — ABNORMAL HIGH (ref 0–99)
Triglycerides: 84 mg/dL (ref 0–149)
VLDL Cholesterol Cal: 17 mg/dL (ref 5–40)

## 2017-10-31 LAB — HEPATIC FUNCTION PANEL
ALT: 14 IU/L (ref 0–32)
AST: 18 IU/L (ref 0–40)
Albumin: 4 g/dL (ref 3.6–4.8)
Alkaline Phosphatase: 79 IU/L (ref 39–117)
Bilirubin Total: 1.3 mg/dL — ABNORMAL HIGH (ref 0.0–1.2)
Bilirubin, Direct: 0.24 mg/dL (ref 0.00–0.40)
Total Protein: 7.4 g/dL (ref 6.0–8.5)

## 2017-10-31 LAB — HGB A1C W/O EAG: Hgb A1c MFr Bld: 5.8 % — ABNORMAL HIGH (ref 4.8–5.6)

## 2017-10-31 LAB — TSH: TSH: 3.11 u[IU]/mL (ref 0.450–4.500)

## 2017-11-03 DIAGNOSIS — H00025 Hordeolum internum left lower eyelid: Secondary | ICD-10-CM | POA: Diagnosis not present

## 2017-11-04 ENCOUNTER — Ambulatory Visit: Payer: Medicare HMO | Admitting: Internal Medicine

## 2017-11-04 VITALS — BP 130/76 | HR 76 | Temp 98.0°F | Resp 18 | Wt 239.2 lb

## 2017-11-04 DIAGNOSIS — M8580 Other specified disorders of bone density and structure, unspecified site: Secondary | ICD-10-CM

## 2017-11-04 DIAGNOSIS — E7800 Pure hypercholesterolemia, unspecified: Secondary | ICD-10-CM | POA: Diagnosis not present

## 2017-11-04 DIAGNOSIS — N8110 Cystocele, unspecified: Secondary | ICD-10-CM | POA: Diagnosis not present

## 2017-11-04 DIAGNOSIS — H00016 Hordeolum externum left eye, unspecified eyelid: Secondary | ICD-10-CM

## 2017-11-04 DIAGNOSIS — E559 Vitamin D deficiency, unspecified: Secondary | ICD-10-CM

## 2017-11-04 NOTE — Progress Notes (Signed)
[name], female   DOB: [birth year], 67 y.o.   MRN: [id number]   Subjective:    [name], female    DOB: 1950/06/29, 67 y.o.   MRN: [id number]  HPI  Patient here for a scheduled follow up.  S/p TVH and combined anterior and posterior repair with cystoscopy.  Using a peassary now.  Doing better.  Has seen ophthalmology for sty.  On abx.  Has improved.  No sinus pressure.  No nasal congestion.  No chest congestion.  No chest pain.  No sob.  No acid reflux.  No abdominal pain.  Bowels moving.  Discussed recent lab results.  Handling stress.     Past Medical History:  Diagnosis Date  . Complication of anesthesia   . Degenerative disc disease, cervical    cervical  . Hypercholesterolemia   . Meralgia paraesthetica   . Migraines   . Nephrolithiasis   . Neurodermatitis   . Obesity   . Osteopenia   . PONV (postoperative nausea and vomiting)   . Vitamin D deficiency    Past Surgical History:  Procedure Laterality Date  . CHOLECYSTECTOMY N/A 04/24/2015   Procedure: LAPAROSCOPIC CHOLECYSTECTOMY WITH INTRAOPERATIVE CHOLANGIOGRAM;  Surgeon: Earline Mayotte, MD;  Location: [facility] ORS;  Service: General;  Laterality: N/A;  . EXTRACORPOREAL SHOCK WAVE LITHOTRIPSY Right 11/17/2014   Procedure: EXTRACORPOREAL SHOCK WAVE LITHOTRIPSY (ESWL);  Surgeon: Orson Ape, MD;  Location: [facility] ORS;  Service: Urology;  Laterality: Right;  . EXTRACORPOREAL SHOCK WAVE LITHOTRIPSY Left 12/15/2014   Procedure: EXTRACORPOREAL SHOCK WAVE LITHOTRIPSY (ESWL);  Surgeon: Orson Ape, MD;  Location: [facility] ORS;  Service: Urology;  Laterality: Left;  . laparoscopic surgery  1990   nephrolithiasis (Dr Orson Slick)  . LITHOTRIPSY  2010, 1980's  . pessary  2003, 2016   Family History  Problem Relation Age of Onset  . Hypertension Mother   . Hypertension Father   . Asthma Sister   . Hypertension Sister   . Diabetes Sister   . Breast cancer Sister 6  . Rectal cancer Sister    Social History    Socioeconomic History  . Marital status: Married    Spouse name: Not on file  . Number of children: 1  . Years of education: Not on file  . Highest education level: Not on file  Occupational History  . Not on file  Social Needs  . Financial resource strain: Not on file  . Food insecurity:    Worry: Not on file    Inability: Not on file  . Transportation needs:    Medical: Not on file    Non-medical: Not on file  Tobacco Use  . Smoking status: Never Smoker  . Smokeless tobacco: Never Used  Substance and Sexual Activity  . Alcohol use: No    Alcohol/week: 0.0 oz  . Drug use: No  . Sexual activity: Not on file  Lifestyle  . Physical activity:    Days per week: Not on file    Minutes per session: Not on file  . Stress: Not on file  Relationships  . Social connections:    Talks on phone: Not on file    Gets together: Not on file    Attends religious service: Not on file    Active member of club or organization: Not on file    Attends meetings of clubs or organizations: Not on file    Relationship status: Not on file  Other Topics Concern  . Not  on file  Social History Narrative  . Not on file    Outpatient Encounter Medications as of 11/04/2017  Medication Sig  . nystatin cream (MYCOSTATIN) Apply 1 application topically 2 (two) times daily as needed.  . Vitamin D, Ergocalciferol, (DRISDOL) 50000 units CAPS capsule TAKE 1 CAPSULE (50,000 UNITS TOTAL) BY MOUTH ONCE A WEEK.  . [DISCONTINUED] ibuprofen (ADVIL,MOTRIN) 600 MG tablet TAKE 1 TABLET (600 MG TOTAL) BY MOUTH EVERY SIX (6) HOURS AS NEEDED FOR PAIN.  . [DISCONTINUED] polyethylene glycol (MIRALAX / GLYCOLAX) packet Take 17 g by mouth daily.   No facility-administered encounter medications on file as of 11/04/2017.     Review of Systems  Constitutional: Negative for appetite change and unexpected weight change.  HENT: Negative for congestion and sinus pressure.   Eyes:       Sty on eye.    Respiratory:  Negative for cough, chest tightness and shortness of breath.   Cardiovascular: Negative for chest pain, palpitations and leg swelling.  Gastrointestinal: Negative for abdominal pain, diarrhea, nausea and vomiting.  Genitourinary: Negative for difficulty urinating and dysuria.  Musculoskeletal: Negative for joint swelling and myalgias.  Skin: Negative for color change and rash.  Neurological: Negative for dizziness, light-headedness and headaches.  Psychiatric/Behavioral: Negative for agitation and dysphoric mood.       Objective:    Physical Exam  Constitutional: She appears well-developed and well-nourished. No distress.  HENT:  Nose: Nose normal.  Mouth/Throat: Oropharynx is clear and moist.  Neck: Neck supple. No thyromegaly present.  Cardiovascular: Normal rate and regular rhythm.  Pulmonary/Chest: Breath sounds normal. No respiratory distress. She has no wheezes.  Abdominal: Soft. Bowel sounds are normal. There is no tenderness.  Musculoskeletal: She exhibits no edema or tenderness.  Lymphadenopathy:    She has no cervical adenopathy.  Skin: No rash noted. No erythema.  Psychiatric: She has a normal mood and affect. Her behavior is normal.    BP 130/76 (BP Location: Left Arm, Patient Position: Sitting, Cuff Size: Large)   Pulse 76   Temp 98 F (36.7 C) (Oral)   Resp 18   Wt 239 lb 3.2 oz (108.5 kg)   LMP 09/10/2016 (Exact Date)   SpO2 97%   BMI 38.61 kg/m  Wt Readings from Last 3 Encounters:  11/04/17 239 lb 3.2 oz (108.5 kg)  05/22/17 235 lb 6.4 oz (106.8 kg)  01/08/17 227 lb (103 kg)     Lab Results  Component Value Date   WBC 4.6 10/30/2017   HGB 12.9 10/30/2017   HCT 39.0 10/30/2017   PLT 296 10/30/2017   GLUCOSE 98 10/30/2017   CHOL 196 10/30/2017   TRIG 84 10/30/2017   HDL 47 10/30/2017   LDLCALC 132 (H) 10/30/2017   ALT 14 10/30/2017   AST 18 10/30/2017   NA 143 10/30/2017   K 4.3 10/30/2017   CL 106 10/30/2017   CREATININE 0.81 10/30/2017    BUN 11 10/30/2017   CO2 24 10/30/2017   TSH 3.110 10/30/2017   HGBA1C 5.8 (H) 10/30/2017    Mm Screening Breast Tomo Bilateral  Result Date: 04/15/2017 CLINICAL DATA:  Screening. EXAM: 2D DIGITAL SCREENING BILATERAL MAMMOGRAM WITH 3D TOMO WITH CAD COMPARISON:  Previous exam(s). ACR Breast Density Category b: There are scattered areas of fibroglandular density. FINDINGS: There are no findings suspicious for malignancy. Images were processed with CAD. IMPRESSION: No mammographic evidence of malignancy. A result letter of this screening mammogram will be mailed directly to the patient. RECOMMENDATION: Screening  mammogram in one year. (Code:SM-B-01Y) BI-RADS CATEGORY  1: Negative. Electronically Signed   By: Edwin Cap M.D.   On: 04/15/2017 14:31       Assessment & Plan:   Problem List Items Addressed This Visit    Hypercholesterolemia    Discussed lab results.  Have discussed cholesterol medication.  She declines.  Wants to follow.  Low cholesterol diet and exercise.  Follow lipid panel.        Osteopenia    Weight bearing exercise.  Follow vitamin D level.        Vaginal prolapse    S/p recent surgery.  Has pessary.  Followed by Dr Lula Olszewski.        Vitamin D deficiency    Continue supplements.  Follow vitamin D level.         Other Visit Diagnoses    Hordeolum externum of left eye, unspecified eyelid    -  Primary   Seeing opthalmology.  on amoxicillin.         Dale [place], MD

## 2017-11-04 NOTE — Patient Instructions (Signed)
Take a probiotic daily while you are on antibiotics and for two weeks after completing the antibiotic.    Examples of probiotics: florastor, culturelle or align

## 2017-11-08 ENCOUNTER — Encounter: Payer: Self-pay | Admitting: Internal Medicine

## 2017-11-08 NOTE — Assessment & Plan Note (Signed)
Discussed lab results.  Have discussed cholesterol medication.  She declines.  Wants to follow.  Low cholesterol diet and exercise.  Follow lipid panel.

## 2017-11-08 NOTE — Assessment & Plan Note (Signed)
Weight bearing exercise.  Follow vitamin D level.

## 2017-11-08 NOTE — Assessment & Plan Note (Signed)
S/p recent surgery.  Has pessary.  Followed by Dr Lula Olszewskionnolly.

## 2017-11-08 NOTE — Assessment & Plan Note (Signed)
Continue supplements.  Follow vitamin D level.    

## 2017-11-24 ENCOUNTER — Telehealth: Payer: Self-pay | Admitting: Internal Medicine

## 2017-11-24 NOTE — Telephone Encounter (Unsigned)
Copied from CRM [id number]. Topic: Quick Communication - Rx Refill/Question >> Nov 24, 2017 10:09 AM Raquel SarnaHayes, Teresa G wrote: nystatin cream (MYCOSTATIN)  [id number] [address]- [place], [place] - [address] [place] [place] [id number] Phone: [phone] Fax: [phone]

## 2017-11-24 NOTE — Telephone Encounter (Signed)
Can you call patient to be sure nothing acute is going on that she needs to be seen for. She has only had this one time.

## 2017-11-24 NOTE — Telephone Encounter (Signed)
Mycostantin refill Last Refill:12/22/15 # 30 g Last OV: 11/04/17 PCP: Dr. Lorin PicketScott [facility] [place]

## 2017-11-25 NOTE — Telephone Encounter (Signed)
Called patient she states that she uses this regularly and has ran out and does not to be seen for an appointment, wants a refill sent in.

## 2017-11-26 ENCOUNTER — Other Ambulatory Visit: Payer: Self-pay

## 2017-11-26 MED ORDER — NYSTATIN 100000 UNIT/GM EX CREA: 1.0000 "application " | TOPICAL_CREAM | Freq: Two times a day (BID) | CUTANEOUS | 1 refills | Status: DC | PRN

## 2017-11-26 NOTE — Telephone Encounter (Signed)
Med refilled.  Pt aware. 

## 2018-02-25 ENCOUNTER — Other Ambulatory Visit: Payer: Self-pay | Admitting: Internal Medicine

## 2018-03-20 ENCOUNTER — Encounter: Payer: Self-pay | Admitting: Internal Medicine

## 2018-04-06 ENCOUNTER — Other Ambulatory Visit: Payer: Self-pay | Admitting: Internal Medicine

## 2018-04-06 DIAGNOSIS — Z1231 Encounter for screening mammogram for malignant neoplasm of breast: Secondary | ICD-10-CM

## 2018-04-24 ENCOUNTER — Ambulatory Visit
Admission: RE | Admit: 2018-04-24 | Discharge: 2018-04-24 | Disposition: A | Discharge status: Home / Self Care | Payer: Medicare HMO | Source: Ambulatory Visit | Attending: Internal Medicine | Admitting: Internal Medicine

## 2018-04-24 DIAGNOSIS — Z1231 Encounter for screening mammogram for malignant neoplasm of breast: Secondary | ICD-10-CM | POA: Insufficient documentation

## 2018-05-11 ENCOUNTER — Telehealth: Payer: Self-pay | Admitting: *Deleted

## 2018-05-11 NOTE — Telephone Encounter (Signed)
Copied from CRM #[id number]. Topic: Compliment - Provider (non-sensitive) >> May 11, 2018 11:57 AM [name], [name] wrote: Patient states she just seen on the television that it is national Women's Physician's Day and she wanted to let Dr Scott know that she and her husband, [name] just love her and Congrats! 

## 2018-05-12 NOTE — Telephone Encounter (Signed)
Copied from CRM [id number]. Topic: Compliment - Provider (non-sensitive) >> May 11, 2018 11:57 AM Jolayne Haines L wrote: Patient states she just seen on the television that it is national Women's Physician's Day and she wanted to let Dr Lorin Picket know that she and her husband, Onalee Hua just love her and Congrats!

## 2018-06-09 ENCOUNTER — Ambulatory Visit: Payer: Medicare HMO | Admitting: Internal Medicine

## 2018-06-09 ENCOUNTER — Encounter: Payer: Self-pay | Admitting: Internal Medicine

## 2018-06-09 VITALS — BP 128/78 | HR 78 | Temp 97.9°F | Resp 16 | Ht 66.0 in | Wt 241.4 lb

## 2018-06-09 DIAGNOSIS — E7800 Pure hypercholesterolemia, unspecified: Secondary | ICD-10-CM | POA: Diagnosis not present

## 2018-06-09 DIAGNOSIS — R42 Dizziness and giddiness: Secondary | ICD-10-CM | POA: Diagnosis not present

## 2018-06-09 DIAGNOSIS — M79673 Pain in unspecified foot: Secondary | ICD-10-CM | POA: Diagnosis not present

## 2018-06-09 DIAGNOSIS — E559 Vitamin D deficiency, unspecified: Secondary | ICD-10-CM

## 2018-06-09 DIAGNOSIS — Z4689 Encounter for fitting and adjustment of other specified devices: Secondary | ICD-10-CM | POA: Diagnosis not present

## 2018-06-09 DIAGNOSIS — N8110 Cystocele, unspecified: Secondary | ICD-10-CM

## 2018-06-09 DIAGNOSIS — Z0000 Encounter for general adult medical examination without abnormal findings: Secondary | ICD-10-CM

## 2018-06-09 DIAGNOSIS — Z01419 Encounter for gynecological examination (general) (routine) without abnormal findings: Secondary | ICD-10-CM | POA: Diagnosis not present

## 2018-06-09 NOTE — Assessment & Plan Note (Signed)
Persistent.  Has seen podiatry.  States has tried multiple devices (boot, orthotics, etc).  Desires no further intervention.  Follow.

## 2018-06-09 NOTE — Assessment & Plan Note (Addendum)
Physical today 06/09/18.  Followed by gyn. Plans for f/u pelvic exam through gyn.  Wants to go back to [facility].  Request to see Dr Tiburcio Pea.  Mammogram 04/24/18 - Birads I.  Colonoscopy 07/2012.  Hemoccult cards given.

## 2018-06-09 NOTE — Assessment & Plan Note (Signed)
Have discussed cholesterol medication.  She has declined.  Low cholesterol diet and exercise.  Follow lipid panel and liver function tests.

## 2018-06-09 NOTE — Assessment & Plan Note (Signed)
S/p surgery.  Has pessary.  Desires to f/u at [facility] for her routine gyn care and pessary management.

## 2018-06-09 NOTE — Progress Notes (Signed)
Patient ID: Chrysta Izzard, female   DOB: Feb 27, 1951, 68 y.o.   MRN: [id number]   Subjective:    Patient ID: [name], female    DOB: July 02, 1950, 68 y.o.   MRN: [id number]  HPI  Patient here for her physical exam.  She reports she is doing relatively well.  States that a few weeks ago, she felt "woozy in the head".  Subsequently developed diarrhea.  Was lying down and felt worse.  Had one episode of emesis and felt better.  Blood pressure when felt bad initially 147/90.  Recheck 127/82.  Felt better after she vomited.  No further episodes.  No headache.  No sinus symptoms.  No further dizziness or light headedness.  No fever.  No chest pain or sob.  No acid reflux.  No abdominal pain.  Bowels moving.  Still having issues with her feet.  Limits her walking.  Discussed diet and exercise.  Has pessary.  Request to reestablish care at [facility] for pessary management and gyn care.     Past Medical History:  Diagnosis Date  . Complication of anesthesia   . Degenerative disc disease, cervical    cervical  . Hypercholesterolemia   . Meralgia paraesthetica   . Migraines   . Nephrolithiasis   . Neurodermatitis   . Obesity   . Osteopenia   . PONV (postoperative nausea and vomiting)   . Vitamin D deficiency    Past Surgical History:  Procedure Laterality Date  . CHOLECYSTECTOMY N/A 04/24/2015   Procedure: LAPAROSCOPIC CHOLECYSTECTOMY WITH INTRAOPERATIVE CHOLANGIOGRAM;  Surgeon: Earline Mayotte, MD;  Location: [facility] ORS;  Service: General;  Laterality: N/A;  . EXTRACORPOREAL SHOCK WAVE LITHOTRIPSY Right 11/17/2014   Procedure: EXTRACORPOREAL SHOCK WAVE LITHOTRIPSY (ESWL);  Surgeon: Orson Ape, MD;  Location: [facility] ORS;  Service: Urology;  Laterality: Right;  . EXTRACORPOREAL SHOCK WAVE LITHOTRIPSY Left 12/15/2014   Procedure: EXTRACORPOREAL SHOCK WAVE LITHOTRIPSY (ESWL);  Surgeon: Orson Ape, MD;  Location: [facility] ORS;  Service: Urology;  Laterality: Left;  . laparoscopic surgery  1990   nephrolithiasis (Dr Orson Slick)  . LITHOTRIPSY  2010, 1980's  . pessary  2003, 2016   Family History  Problem Relation Age of Onset  . Hypertension Mother   . Hypertension Father   . Asthma Sister   . Hypertension Sister   . Diabetes Sister   . Breast cancer Sister 41  . Rectal cancer Sister    Social History   Socioeconomic History  . Marital status: Married    Spouse name: Not on file  . Number of children: 1  . Years of education: Not on file  . Highest education level: Not on file  Occupational History  . Not on file  Social Needs  . Financial resource strain: Not on file  . Food insecurity:    Worry: Not on file    Inability: Not on file  . Transportation needs:    Medical: Not on file    Non-medical: Not on file  Tobacco Use  . Smoking status: Never Smoker  . Smokeless tobacco: Never Used  Substance and Sexual Activity  . Alcohol use: No    Alcohol/week: 0.0 standard drinks  . Drug use: No  . Sexual activity: Not on file  Lifestyle  . Physical activity:    Days per week: Not on file    Minutes per session: Not on file  . Stress: Not on file  Relationships  . Social connections:    Talks on phone: Not  on file    Gets together: Not on file    Attends religious service: Not on file    Active member of club or organization: Not on file    Attends meetings of clubs or organizations: Not on file    Relationship status: Not on file  Other Topics Concern  . Not on file  Social History Narrative  . Not on file    Outpatient Encounter Medications as of 06/09/2018  Medication Sig  . Vitamin D, Ergocalciferol, (DRISDOL) 1.25 MG (50000 UT) CAPS capsule TAKE 1 CAPSULE (50,000 UNITS TOTAL) BY MOUTH ONCE A WEEK.  . [DISCONTINUED] nystatin cream (MYCOSTATIN) Apply 1 application topically 2 (two) times daily as needed.   No facility-administered encounter medications on file as of 06/09/2018.     Review of Systems  Constitutional: Negative for appetite change and  unexpected weight change.  HENT: Negative for congestion and sinus pressure.   Eyes: Negative for pain and visual disturbance.  Respiratory: Negative for cough, chest tightness and shortness of breath.   Cardiovascular: Negative for chest pain, palpitations and leg swelling.  Gastrointestinal: Negative for abdominal pain, diarrhea, nausea and vomiting.  Genitourinary: Negative for difficulty urinating and dysuria.  Musculoskeletal: Negative for joint swelling and myalgias.       Pain in her feet as outlined.    Skin: Negative for color change and rash.  Neurological: Negative for dizziness, light-headedness and headaches.  Hematological: Negative for adenopathy. Does not bruise/bleed easily.  Psychiatric/Behavioral: Negative for agitation and dysphoric mood.       Objective:    Physical Exam Constitutional:      General: She is not in acute distress.    Appearance: Normal appearance. She is well-developed.  HENT:     Nose: Nose normal. No congestion.     Mouth/Throat:     Pharynx: No oropharyngeal exudate or posterior oropharyngeal erythema.  Eyes:     General: No scleral icterus.       Right eye: No discharge.        Left eye: No discharge.  Neck:     Musculoskeletal: Neck supple. No muscular tenderness.     Thyroid: No thyromegaly.  Cardiovascular:     Rate and Rhythm: Normal rate and regular rhythm.  Pulmonary:     Effort: No tachypnea, accessory muscle usage or respiratory distress.     Breath sounds: Normal breath sounds. No decreased breath sounds or wheezing.  Chest:     Breasts:        Right: No inverted nipple, mass, nipple discharge or tenderness (no axillary adenopathy).        Left: No inverted nipple, mass, nipple discharge or tenderness (no axilarry adenopathy).  Abdominal:     General: Bowel sounds are normal.     Palpations: Abdomen is soft.     Tenderness: There is no abdominal tenderness.  Musculoskeletal:        General: No swelling or tenderness.      Comments: DP pulses palpable and equal bilaterally.   Lymphadenopathy:     Cervical: No cervical adenopathy.  Skin:    Findings: No erythema or rash.  Neurological:     Mental Status: She is alert and oriented to person, place, and time.  Psychiatric:        Mood and Affect: Mood normal.        Behavior: Behavior normal.     BP 128/78   Pulse 78   Temp 97.9 F (36.6 C) (Oral)  Resp 16   Ht  (1.676 m)   Wt 241 lb 6.4 oz (109.5 kg)   LMP 09/10/2016 (Exact Date)   SpO2 97%   BMI 38.96 kg/m  Wt Readings from Last 3 Encounters:  06/09/18 241 lb 6.4 oz (109.5 kg)  11/04/17 239 lb 3.2 oz (108.5 kg)  05/22/17 235 lb 6.4 oz (106.8 kg)     Lab Results  Component Value Date   WBC 4.6 10/30/2017   HGB 12.9 10/30/2017   HCT 39.0 10/30/2017   PLT 296 10/30/2017   GLUCOSE 98 10/30/2017   CHOL 196 10/30/2017   TRIG 84 10/30/2017   HDL 47 10/30/2017   LDLCALC 132 (H) 10/30/2017   ALT 14 10/30/2017   AST 18 10/30/2017   NA 143 10/30/2017   K 4.3 10/30/2017   CL 106 10/30/2017   CREATININE 0.81 10/30/2017   BUN 11 10/30/2017   CO2 24 10/30/2017   TSH 3.110 10/30/2017   HGBA1C 5.8 (H) 10/30/2017    Mm 3d Screen Breast Bilateral  Result Date: 04/24/2018 CLINICAL DATA:  Screening. EXAM: DIGITAL SCREENING BILATERAL MAMMOGRAM WITH TOMO AND CAD COMPARISON:  Previous exam(s). ACR Breast Density Category b: There are scattered areas of fibroglandular density. FINDINGS: There are no findings suspicious for malignancy. Images were processed with CAD. IMPRESSION: No mammographic evidence of malignancy. A result letter of this screening mammogram will be mailed directly to the patient. RECOMMENDATION: Screening mammogram in one year. (Code:SM-B-01Y) BI-RADS CATEGORY  1: Negative. Electronically Signed   By: Baird Lyons M.D.   On: 04/24/2018 10:37       Assessment & Plan:   Problem List Items Addressed This Visit    Dizziness    Previous episode as outlined.  Was associated  with vomiting and diarrhea.  Resolved when GI symptoms resolved.  No further episodes.  Wants to monitor.        Foot pain    Persistent.  Has seen podiatry.  States has tried multiple devices (boot, orthotics, etc).  Desires no further intervention.  Follow.        Health care maintenance    Physical today 06/09/18.  Followed by gyn. Plans for f/u pelvic exam through gyn.  Wants to go back to [facility].  Request to see Dr Tiburcio Pea.  Mammogram 04/24/18 - Birads I.  Colonoscopy 07/2012.  Hemoccult cards given.        Hypercholesterolemia    Have discussed cholesterol medication.  She has declined.  Low cholesterol diet and exercise.  Follow lipid panel and liver function tests.        Vaginal prolapse    S/p surgery.  Has pessary.  Desires to f/u at [facility] for her routine gyn care and pessary management.        Vitamin D deficiency    On vitamin D supplements.  Follow vitamin D level.        Other Visit Diagnoses    Routine general medical examination at a health care facility    -  Primary   Pessary maintenance       Relevant Orders   Ambulatory referral to Gynecology   Encounter for gynecological examination without abnormal finding       Relevant Orders   Ambulatory referral to Gynecology       Dale Friendship, MD

## 2018-06-09 NOTE — Assessment & Plan Note (Signed)
Previous episode as outlined.  Was associated with vomiting and diarrhea.  Resolved when GI symptoms resolved.  No further episodes.  Wants to monitor.

## 2018-06-09 NOTE — Assessment & Plan Note (Signed)
On vitamin D supplements.  Follow vitamin D level.   

## 2018-06-11 ENCOUNTER — Telehealth: Payer: Self-pay

## 2018-06-11 NOTE — Telephone Encounter (Signed)
Copied from CRM [id number]. Topic: Referral - Question >> Jun 11, 2018  9:29 AM Angela Nevin wrote: Reason for CRM: Patient inquired if referral from 3/3 can be switched from Dr. Tiburcio Pea to a female provider. Please advise.

## 2018-06-11 NOTE — Telephone Encounter (Signed)
Updated referral to reflect that she wishes to see a female provider. She can also let them know when they contact her to schedule appointment.

## 2018-06-11 NOTE — Telephone Encounter (Signed)
It looks like in referral she requested Dr. Tiburcio Pea but is now requesting a female provider. Can we change it or is this something that she can call and do?

## 2018-06-11 NOTE — Telephone Encounter (Signed)
Patient is aware 

## 2018-06-16 ENCOUNTER — Encounter: Payer: Self-pay | Admitting: Obstetrics and Gynecology

## 2018-06-16 ENCOUNTER — Ambulatory Visit (INDEPENDENT_AMBULATORY_CARE_PROVIDER_SITE_OTHER): Payer: Medicare HMO | Admitting: Obstetrics and Gynecology

## 2018-06-16 VITALS — BP 148/88 | HR 83 | Ht 66.0 in | Wt 241.0 lb

## 2018-06-16 DIAGNOSIS — Z4689 Encounter for fitting and adjustment of other specified devices: Secondary | ICD-10-CM | POA: Diagnosis not present

## 2018-06-16 NOTE — Progress Notes (Signed)
HPI:      Nicole Hardy is a 68 y.o. G1P1001 who presents today for her pessary follow up and examination related to her pelvic floor weakening.  Pt reports tolerating the pessary well with  no vaginal bleeding and  no vaginal discharge.  Symptoms of pelvic floor weakening have greatly improved. She is voiding and defecating without difficulty. She notes urinary frequency with 3-4 awakening at night. She currently has a ring pessary with support.  PMHx: She  has a past medical history of Complication of anesthesia, Degenerative disc disease, cervical, Hypercholesterolemia, Meralgia paraesthetica, Migraines, Nephrolithiasis, Neurodermatitis, Obesity, Osteopenia, PONV (postoperative nausea and vomiting), and Vitamin D deficiency. Also,  has a past surgical history that includes laparoscopic surgery (1990); Lithotripsy (2010, 1980's); Extracorporeal shock wave lithotripsy (Right, 11/17/2014); Extracorporeal shock wave lithotripsy (Left, 12/15/2014); pessary (2003, 2016); and Cholecystectomy (N/A, 04/24/2015)., family history includes Asthma in her sister; Breast cancer (age of onset: 73) in her sister; Diabetes in her sister; Hypertension in her father, mother, and sister; Rectal cancer in her sister.,  reports that she has never smoked. She has never used smokeless tobacco. She reports that she does not drink alcohol or use drugs.  She has a current medication list which includes the following prescription(s): vitamin d (ergocalciferol). Also, is allergic to metronidazole.  Review of Systems  Constitutional: Negative for chills, fever, malaise/fatigue and weight loss.  HENT: Negative for congestion, hearing loss and sinus pain.   Eyes: Negative for blurred vision and double vision.  Respiratory: Negative for cough, sputum production, shortness of breath and wheezing.   Cardiovascular: Negative for chest pain, palpitations, orthopnea and leg swelling.  Gastrointestinal: Negative for abdominal pain,  constipation, diarrhea, nausea and vomiting.  Genitourinary: Negative for dysuria, flank pain, frequency, hematuria and urgency.  Musculoskeletal: Negative for back pain, falls and joint pain.  Skin: Negative for itching and rash.  Neurological: Negative for dizziness and headaches.  Psychiatric/Behavioral: Negative for depression, substance abuse and suicidal ideas. The patient is not nervous/anxious.     Objective: BP (!) 148/88   Pulse 83   Ht 5\' 6"  (1.676 m)   Wt 241 lb (109.3 kg)   LMP 09/10/2016 (Exact Date)   BMI 38.90 kg/m  Physical Exam Constitutional:      Appearance: She is well-developed.  Genitourinary:     Vagina and uterus normal.     No lesions in the vagina.     No cervical motion tenderness.     No right or left adnexal mass present.     Genitourinary Comments: Stage 2 cystocele Stage 1 rectocele No erosions of vaginal mucosa noted. Ring with support pessary replaced. Estrogen cream applied to pessary.   HENT:     Head: Normocephalic and atraumatic.  Neck:     Musculoskeletal: Neck supple.     Thyroid: No thyromegaly.  Cardiovascular:     Rate and Rhythm: Normal rate and regular rhythm.     Heart sounds: Normal heart sounds.  Pulmonary:     Effort: Pulmonary effort is normal.     Breath sounds: Normal breath sounds.  Chest:     Breasts:        Right: No inverted nipple, mass, nipple discharge or skin change.        Left: No inverted nipple, mass, nipple discharge or skin change.  Abdominal:     General: Bowel sounds are normal. There is no distension.     Palpations: Abdomen is soft. There is no mass.  Neurological:  Mental Status: She is alert and oriented to person, place, and time.  Skin:    General: Skin is warm and dry.  Psychiatric:        Behavior: Behavior normal.        Thought Content: Thought content normal.        Judgment: Judgment normal.  Vitals signs reviewed.     Pessary Care Pessary removed and cleaned.  Vagina checked  - without erosions - pessary replaced.  A/P:  Pessary was cleaned and replaced today. Instructions given for care. Patient often takes out pessary on her own at home and replaces it. She will spply estrogen cream once a month.  Concerning symptoms to observe for are counseled to patient. Follow up scheduled for 4 months.  A total of 15 minutes were spent face-to-face with the patient during this encounter and over half of that time dealt with counseling and coordination of care.  Adelene Idler MD [facility], [facility] 06/16/2018 1:35 PM

## 2018-08-03 ENCOUNTER — Ambulatory Visit (INDEPENDENT_AMBULATORY_CARE_PROVIDER_SITE_OTHER): Payer: Medicare HMO

## 2018-08-03 ENCOUNTER — Other Ambulatory Visit: Payer: Self-pay

## 2018-08-03 DIAGNOSIS — Z0000 Encounter for general adult medical examination without abnormal findings: Secondary | ICD-10-CM | POA: Diagnosis not present

## 2018-08-03 NOTE — Progress Notes (Signed)
Subjective:   Nicole Hardy is a 68 y.o. female who presents for an Initial Medicare Annual Wellness Visit.  Review of Systems    No ROS.  Medicare Wellness Visit. Additional risk factors are reflected in the social history.  Cardiac Risk Factors include: advanced age (>87men, >51 women)     Objective:    Today's Vitals   There is no height or weight on file to calculate BMI. UTA vital signs, virtual visit Advanced Directives 08/03/2018 04/24/2015 04/18/2015 11/17/2014  Does Patient Have a Medical Advance Directive? No No Yes;No No  Does patient want to make changes to medical advance directive? - - No - Patient declined -  Would patient like information on creating a medical advance directive? No - Patient declined - - No - patient declined information    Current Medications (verified) Outpatient Encounter Medications as of 08/03/2018  Medication Sig  . Vitamin D, Ergocalciferol, (DRISDOL) 1.25 MG (50000 UT) CAPS capsule TAKE 1 CAPSULE (50,000 UNITS TOTAL) BY MOUTH ONCE A WEEK.   No facility-administered encounter medications on file as of 08/03/2018.     Allergies (verified) Metronidazole   History: Past Medical History:  Diagnosis Date  . Complication of anesthesia   . Degenerative disc disease, cervical    cervical  . Hypercholesterolemia   . Meralgia paraesthetica   . Migraines   . Nephrolithiasis   . Neurodermatitis   . Obesity   . Osteopenia   . PONV (postoperative nausea and vomiting)   . Vitamin D deficiency    Past Surgical History:  Procedure Laterality Date  . CHOLECYSTECTOMY N/A 04/24/2015   Procedure: LAPAROSCOPIC CHOLECYSTECTOMY WITH INTRAOPERATIVE CHOLANGIOGRAM;  Surgeon: Earline Mayotte, MD;  Location: [facility] ORS;  Service: General;  Laterality: N/A;  . EXTRACORPOREAL SHOCK WAVE LITHOTRIPSY Right 11/17/2014   Procedure: EXTRACORPOREAL SHOCK WAVE LITHOTRIPSY (ESWL);  Surgeon: Orson Ape, MD;  Location: [facility] ORS;  Service: Urology;  Laterality:  Right;  . EXTRACORPOREAL SHOCK WAVE LITHOTRIPSY Left 12/15/2014   Procedure: EXTRACORPOREAL SHOCK WAVE LITHOTRIPSY (ESWL);  Surgeon: Orson Ape, MD;  Location: [facility] ORS;  Service: Urology;  Laterality: Left;  . laparoscopic surgery  1990   nephrolithiasis (Dr Orson Slick)  . LITHOTRIPSY  2010, 1980's  . pessary  2003, 2016   Family History  Problem Relation Age of Onset  . Hypertension Mother   . Hypertension Father   . Asthma Sister   . Hypertension Sister   . Diabetes Sister   . Breast cancer Sister 60  . Rectal cancer Sister    Social History   Socioeconomic History  . Marital status: Married    Spouse name: Not on file  . Number of children: 1  . Years of education: Not on file  . Highest education level: Not on file  Occupational History  . Not on file  Social Needs  . Financial resource strain: Not hard at all  . Food insecurity:    Worry: Never true    Inability: Never true  . Transportation needs:    Medical: No    Non-medical: No  Tobacco Use  . Smoking status: Never Smoker  . Smokeless tobacco: Never Used  Substance and Sexual Activity  . Alcohol use: No    Alcohol/week: 0.0 standard drinks  . Drug use: No  . Sexual activity: Not Currently    Birth control/protection: Surgical  Lifestyle  . Physical activity:    Days per week: 2 days    Minutes per session: 30 min  .  Stress: Not at all  Relationships  . Social connections:    Talks on phone: Not on file    Gets together: Not on file    Attends religious service: Not on file    Active member of club or organization: Not on file    Attends meetings of clubs or organizations: Not on file    Relationship status: Not on file  Other Topics Concern  . Not on file  Social History Narrative  . Not on file    Tobacco Counseling Counseling given: Not Answered   Clinical Intake:  Pre-visit preparation completed: Yes  Pain : No/denies pain     Diabetes: No  How often do you need to have someone  help you when you read instructions, pamphlets, or other written materials from your doctor or pharmacy?: 1 - Never  Interpreter Needed?: No      Activities of Daily Living In your present state of health, do you have any difficulty performing the following activities: 08/03/2018  Hearing? N  Vision? N  Difficulty concentrating or making decisions? N  Walking or climbing stairs? N  Dressing or bathing? N  Doing errands, shopping? N  Preparing Food and eating ? N  Using the Toilet? N  In the past six months, have you accidently leaked urine? N  Comment Wears pessary  Do you have problems with loss of bowel control? N  Managing your Medications? N  Managing your Finances? N  Housekeeping or managing your Housekeeping? N  Some recent data might be hidden     Immunizations and Health Maintenance Immunization History  Administered Date(s) Administered  . PPD Test 12/25/2015, 11/20/2016   Health Maintenance Due  Topic Date Due  . Hepatitis C Screening  1950-11-04  . TETANUS/TDAP  06/09/1971  . DEXA SCAN  06/08/2017  . PNA vac Low Risk Adult (1 of 2 - PCV13) 06/08/2017    Patient Care Team: Dale DurhamScott, Charlene, MD as PCP - General (Internal Medicine) Lemar LivingsByrnett, Merrily PewJeffrey W, MD as Consulting Physician (General Surgery) Dale DurhamScott, Charlene, MD as Referring Physician (Internal Medicine)  Indicate any recent Medical Services you may have received from other than [facility] providers in the past year (date may be approximate).     Assessment:   This is a routine wellness examination for Nicole Hardy.  I connected with patient 08/03/18 at  1:30 PM EDT by a video enabled telemedicine application and verified that I am speaking with the correct person using two identifiers. Patient stated full name and DOB. Patient gave permission to continue with virtual visit. Patient's location was at home and Nurse's location was at [place] office.   Health Screenings  Mammogram -04/24/18 Colonoscopy -07/23/12 Bone  Density- due; discussed. Glaucoma -none Hearing -demonstrates normal hearing during conversation. Hemoglobin A1C -10/30/17 (5.8) Cholesterol -10/30/17 (196) Dental- every 6 months Vision- every 12 months  Social  Alcohol intake -no Smoking history- never Smokers in home? none Illicit drug use? none Exercise -treadmill 2 days per week, 30 min Diet -regular Sexually Active -not currently Multiple Partners- no  Safety  Patient feels safe at home.  Patient does have smoke detectors at home  Patient does wear sunscreen or protective clothing when in direct sunlight. Patient does wear seat belt when driving or riding with others.  Activities of Daily Living Patient can do their own household chores. Denies needing assistance with: driving, feeding themselves, getting from bed to chair, getting to the toilet, bathing/showering, dressing, managing money, climbing flight of stairs, or preparing meals.  Depression Screen Patient denies losing interest in daily life, feeling hopeless, or crying easily over simple problems.   Fall Screen Patient denies being afraid of falling or falling in the last year.   Memory Screen Patient denies problems with memory, misplacing items, and is able to balance checkbook/bank accounts.  Patient is alert, normal appearance, oriented to person/place/and time. Correctly identified the president of the [place], recall of 3/3 objects, and performing simple calculations.  Patient displays appropriate judgement and can read correct time from watch face.   Immunizations The following Immunizations were discussed: Influenza, shingles, pneumonia, and tetanus.   Other Providers Patient Care Team: Dale Anselmo, MD as PCP - General (Internal Medicine) Lemar Livings Merrily Pew, MD as Consulting Physician (General Surgery) Dale Coburg, MD as Referring Physician (Internal Medicine)  Hearing/Vision screen Hearing Screening Comments: Patient is able to hear  conversational tones without difficulty.  No issues reported.   Vision Screening Comments: Followed by [facility] Wears corrective lenses Last OV 2020 No vision screening; virtual visit She has seen her ophthalmologist within the last 12 months Dietary issues and exercise activities discussed: Current Exercise Habits: Home exercise routine, Type of exercise: treadmill;stretching, Time (Minutes): 30, Frequency (Times/Week): 2, Weekly Exercise (Minutes/Week): 60  Goals    . Increase physical activity     Continue walking on the treadmill, increase level as tolerated      Depression Screen PHQ 2/9 Scores 08/03/2018 06/09/2018 01/08/2017 12/22/2015 12/01/2014  PHQ - 2 Score 0 0 0 0 0  PHQ- 9 Score 0 0 - - -    Fall Risk Fall Risk  08/03/2018 06/09/2018 01/08/2017 12/22/2015 12/01/2014  Falls in the past year? 0 0 No No No   Cognitive Function:     6CIT Screen 08/03/2018  What Year? 0 points  What month? 0 points  What time? 0 points  Count back from 20 0 points  Months in reverse 0 points  Repeat phrase 0 points  Total Score 0    Screening Tests Health Maintenance  Topic Date Due  . Hepatitis C Screening  [birth year]  . TETANUS/TDAP  06/09/1971  . DEXA SCAN  06/08/2017  . PNA vac Low Risk Adult (1 of 2 - PCV13) 06/08/2017  . MAMMOGRAM  04/24/2020  . COLONOSCOPY  07/24/2022  . INFLUENZA VACCINE  Discontinued     Plan:   End of life planning; Advanced aging; Advanced directives discussed.  No HCPOA/Living Will.  Additional information declined at this time.  I have personally reviewed and noted the following in the patient's chart:   . Medical and social history . Use of alcohol, tobacco or illicit drugs  . Current medications and supplements . Functional ability and status . Nutritional status . Physical activity . Advanced directives . List of other physicians . Hospitalizations, surgeries, and ER visits in previous 12 months . Vitals . Screenings to include  cognitive, depression, and falls . Referrals and appointments  In addition, I have reviewed and discussed with patient certain preventive protocols, quality metrics, and best practice recommendations. A written personalized care plan for preventive services as well as general preventive health recommendations were provided to patient.     Ashok Pall, LPN   04/10/7251    Reviewed above information.  Agree with assessment and plan.    Dr Lorin Picket

## 2018-08-03 NOTE — Patient Instructions (Addendum)
  [name] , Thank you for taking time to come for your Medicare Wellness Visit. I appreciate your ongoing commitment to your health goals. Please review the following plan we discussed and let me know if I can assist you in the future.   Keep all routine maintenance appointments.   These are the goals we discussed: Goals    . Increase physical activity     Continue walking on the treadmill, increase level as tolerated       This is a list of the screening recommended for you and due dates:  Health Maintenance  Topic Date Due  .  Hepatitis C: One time screening is recommended by Center for Disease Control  (CDC) for  adults born from [birth year] through 1963.   [birth year]  . Tetanus Vaccine  06/09/1971  . DEXA scan (bone density measurement)  06/08/2017  . Pneumonia vaccines (1 of 2 - PCV13) 06/08/2017  . Mammogram  04/24/2020  . Colon Cancer Screening  07/24/2022  . Flu Shot  Discontinued

## 2018-10-16 ENCOUNTER — Ambulatory Visit: Payer: Medicare HMO | Admitting: Obstetrics and Gynecology

## 2018-10-27 ENCOUNTER — Other Ambulatory Visit: Payer: Self-pay | Admitting: Internal Medicine

## 2018-11-02 ENCOUNTER — Telehealth: Payer: Self-pay | Admitting: Internal Medicine

## 2018-11-02 NOTE — Telephone Encounter (Signed)
Last Vitamin D lab:  05/14/17

## 2018-11-02 NOTE — Telephone Encounter (Signed)
Vitamin D, Ergocalciferol, (DRISDOL) 1.25 MG (50000 UT) CAPS capsule   Sent  [facility]

## 2018-11-03 ENCOUNTER — Other Ambulatory Visit: Payer: Self-pay

## 2018-11-03 MED ORDER — VITAMIN D (ERGOCALCIFEROL) 1.25 MG (50000 UNIT) PO CAPS: ORAL_CAPSULE | ORAL | 0 refills | Status: DC

## 2018-11-03 NOTE — Telephone Encounter (Signed)
Pt stated she has been on this medication for a few years. Advised that she would need Vit D lab drawn with her next labs. Patient stated she would be having them drawn prior to her appt in Sept

## 2018-11-03 NOTE — Telephone Encounter (Signed)
Pt following up on Vit D Rx request.  Pt has appt 12/10/18  [id number] [address], [place] [phone] (Phone) [phone] (Fax)

## 2018-11-18 ENCOUNTER — Telehealth: Payer: Self-pay

## 2018-11-18 DIAGNOSIS — E559 Vitamin D deficiency, unspecified: Secondary | ICD-10-CM

## 2018-11-18 DIAGNOSIS — R739 Hyperglycemia, unspecified: Secondary | ICD-10-CM

## 2018-11-18 NOTE — Telephone Encounter (Signed)
Copied from [place] [id number]. Topic: General - Other >> Nov 18, 2018  2:54 PM Pauline Good wrote: Reason for CRM: pt need lab order fax to [facility] [phone]. pt's appt is Monday. Pt want complete panel >> Nov 18, 2018  3:07 PM Celene Kras A wrote: Pt called stating she is not allowed to get labs done at [facility]. Pt is requesting orders be put in for her to complete labs before her appt on 12/10/2018. Please advise.

## 2018-11-19 NOTE — Telephone Encounter (Signed)
Called to clarify. Patient would like to have fasting labs done here. She would also like to have vit D level checked with labs. Fasting lab appt scheduled

## 2018-11-25 NOTE — Telephone Encounter (Signed)
Lab orders placed future

## 2018-11-25 NOTE — Addendum Note (Signed)
Addended by: Lars Masson on: 11/25/2018 03:15 PM   Modules accepted: Orders

## 2018-12-07 ENCOUNTER — Other Ambulatory Visit (INDEPENDENT_AMBULATORY_CARE_PROVIDER_SITE_OTHER): Payer: Medicare HMO

## 2018-12-07 ENCOUNTER — Other Ambulatory Visit: Payer: Medicare HMO

## 2018-12-07 ENCOUNTER — Other Ambulatory Visit: Payer: Self-pay

## 2018-12-07 DIAGNOSIS — E559 Vitamin D deficiency, unspecified: Secondary | ICD-10-CM

## 2018-12-07 DIAGNOSIS — R739 Hyperglycemia, unspecified: Secondary | ICD-10-CM

## 2018-12-07 LAB — CBC WITH DIFFERENTIAL/PLATELET
Basophils Absolute: 0 10*3/uL (ref 0.0–0.1)
Basophils Relative: 0.6 % (ref 0.0–3.0)
Eosinophils Absolute: 0.1 10*3/uL (ref 0.0–0.7)
Eosinophils Relative: 0.9 % (ref 0.0–5.0)
HCT: 40 % (ref 36.0–46.0)
Hemoglobin: 13.1 g/dL (ref 12.0–15.0)
Lymphocytes Relative: 33.3 % (ref 12.0–46.0)
Lymphs Abs: 2.1 10*3/uL (ref 0.7–4.0)
MCHC: 32.7 g/dL (ref 30.0–36.0)
MCV: 91.5 fl (ref 78.0–100.0)
Monocytes Absolute: 0.3 10*3/uL (ref 0.1–1.0)
Monocytes Relative: 5 % (ref 3.0–12.0)
Neutro Abs: 3.8 10*3/uL (ref 1.4–7.7)
Neutrophils Relative %: 60.2 % (ref 43.0–77.0)
Platelets: 288 10*3/uL (ref 150.0–400.0)
RBC: 4.37 Mil/uL (ref 3.87–5.11)
RDW: 14 % (ref 11.5–15.5)
WBC: 6.2 10*3/uL (ref 4.0–10.5)

## 2018-12-07 LAB — LIPID PANEL
Cholesterol: 191 mg/dL (ref 0–200)
HDL: 45 mg/dL (ref >39.00)
LDL Cholesterol: 134 mg/dL — ABNORMAL HIGH (ref 0–99)
NonHDL: 145.6
Total CHOL/HDL Ratio: 4
Triglycerides: 57 mg/dL (ref 0.0–149.0)
VLDL: 11.4 mg/dL (ref 0.0–40.0)

## 2018-12-07 LAB — BASIC METABOLIC PANEL
BUN: 11 mg/dL (ref 6–23)
CO2: 29 mEq/L (ref 19–32)
Calcium: 9.6 mg/dL (ref 8.4–10.5)
Chloride: 108 mEq/L (ref 96–112)
Creatinine, Ser: 0.73 mg/dL (ref 0.40–1.20)
GFR: 96.37 mL/min (ref >60.00)
Glucose, Bld: 90 mg/dL (ref 70–99)
Potassium: 4.5 mEq/L (ref 3.5–5.1)
Sodium: 144 mEq/L (ref 135–145)

## 2018-12-07 LAB — VITAMIN D 25 HYDROXY (VIT D DEFICIENCY, FRACTURES): VITD: 29.26 ng/mL — ABNORMAL LOW (ref 30.00–100.00)

## 2018-12-07 LAB — HEPATIC FUNCTION PANEL
ALT: 13 U/L (ref 0–35)
AST: 16 U/L (ref 0–37)
Albumin: 3.9 g/dL (ref 3.5–5.2)
Alkaline Phosphatase: 71 U/L (ref 39–117)
Bilirubin, Direct: 0.2 mg/dL (ref 0.0–0.3)
Total Bilirubin: 0.9 mg/dL (ref 0.2–1.2)
Total Protein: 7.2 g/dL (ref 6.0–8.3)

## 2018-12-07 LAB — TSH: TSH: 1.4 u[IU]/mL (ref 0.35–4.50)

## 2018-12-07 LAB — HEMOGLOBIN A1C: Hgb A1c MFr Bld: 5.8 % (ref 4.6–6.5)

## 2018-12-08 ENCOUNTER — Other Ambulatory Visit: Payer: Medicare HMO

## 2018-12-10 ENCOUNTER — Encounter: Payer: Self-pay | Admitting: Internal Medicine

## 2018-12-10 ENCOUNTER — Other Ambulatory Visit: Payer: Self-pay

## 2018-12-10 ENCOUNTER — Ambulatory Visit: Payer: Medicare HMO | Admitting: Internal Medicine

## 2018-12-10 DIAGNOSIS — E559 Vitamin D deficiency, unspecified: Secondary | ICD-10-CM

## 2018-12-10 DIAGNOSIS — E7800 Pure hypercholesterolemia, unspecified: Secondary | ICD-10-CM

## 2018-12-10 DIAGNOSIS — M79673 Pain in unspecified foot: Secondary | ICD-10-CM | POA: Diagnosis not present

## 2018-12-10 DIAGNOSIS — F439 Reaction to severe stress, unspecified: Secondary | ICD-10-CM

## 2018-12-10 DIAGNOSIS — N8110 Cystocele, unspecified: Secondary | ICD-10-CM | POA: Diagnosis not present

## 2018-12-10 DIAGNOSIS — R69 Illness, unspecified: Secondary | ICD-10-CM | POA: Diagnosis not present

## 2018-12-10 MED ORDER — VITAMIN D (ERGOCALCIFEROL) 1.25 MG (50000 UNIT) PO CAPS: ORAL_CAPSULE | ORAL | 0 refills | Status: DC

## 2018-12-10 NOTE — Patient Instructions (Signed)
After completing the prescription vitamin D, start vitamin D3 2000 units per day.

## 2018-12-10 NOTE — Progress Notes (Signed)
[name], female   DOB: Jan 29, 1951, 68 y.o.   MRN: [id number]   Subjective:    [name], female    DOB: [birth year], 68 y.o.   MRN: [id number]  HPI  Patient here for a scheduled follow up.  Increased stress with her husband's health issues.  Discussed with her today.  She feels she is handling things relatively well.  Does not feel she needs any further intervention.  Trying to stay active.  No chest pain.  No sob.  No acid reflux. No abdominal pain.  Bowels moving.  Has pessary.  Followed by Dr Gilman Schmidt.  Discussed labs.  Discussed calculated cholesterol risk.  Discussed recommendation to start a cholesterol medication.  She wants to thinks about this and will let me know if she agrees to start.  Discussed immunizations.  Discussed her husband's health issues and some needs of his.       Past Medical History:  Diagnosis Date  . Complication of anesthesia   . Degenerative disc disease, cervical    cervical  . Hypercholesterolemia   . Meralgia paraesthetica   . Migraines   . Nephrolithiasis   . Neurodermatitis   . Obesity   . Osteopenia   . PONV (postoperative nausea and vomiting)   . Vitamin D deficiency    Past Surgical History:  Procedure Laterality Date  . CHOLECYSTECTOMY N/A 04/24/2015   Procedure: LAPAROSCOPIC CHOLECYSTECTOMY WITH INTRAOPERATIVE CHOLANGIOGRAM;  Surgeon: Robert Bellow, MD;  Location: [facility] ORS;  Service: General;  Laterality: N/A;  . EXTRACORPOREAL SHOCK WAVE LITHOTRIPSY Right 11/17/2014   Procedure: EXTRACORPOREAL SHOCK WAVE LITHOTRIPSY (ESWL);  Surgeon: Royston Cowper, MD;  Location: [facility] ORS;  Service: Urology;  Laterality: Right;  . EXTRACORPOREAL SHOCK WAVE LITHOTRIPSY Left 12/15/2014   Procedure: EXTRACORPOREAL SHOCK WAVE LITHOTRIPSY (ESWL);  Surgeon: Royston Cowper, MD;  Location: [facility] ORS;  Service: Urology;  Laterality: Left;  . laparoscopic surgery  1990   nephrolithiasis (Dr Ernst Spell)  . LITHOTRIPSY  2010, 1980's  . pessary   2003, 2016   Family History  Problem Relation Age of Onset  . Hypertension Mother   . Hypertension Father   . Asthma Sister   . Hypertension Sister   . Diabetes Sister   . Breast cancer Sister 30  . Rectal cancer Sister    Social History   Socioeconomic History  . Marital status: Married    Spouse name: Not on file  . Number of children: 1  . Years of education: Not on file  . Highest education level: Not on file  Occupational History  . Not on file  Social Needs  . Financial resource strain: Not hard at all  . Food insecurity    Worry: Never true    Inability: Never true  . Transportation needs    Medical: No    Non-medical: No  Tobacco Use  . Smoking status: Never Smoker  . Smokeless tobacco: Never Used  Substance and Sexual Activity  . Alcohol use: No    Alcohol/week: 0.0 standard drinks  . Drug use: No  . Sexual activity: Not Currently    Birth control/protection: Surgical  Lifestyle  . Physical activity    Days per week: 2 days    Minutes per session: 30 min  . Stress: Not at all  Relationships  . Social Herbalist on phone: Not on file    Gets together: Not on file    Attends religious service: Not on  file    Active member of club or organization: Not on file    Attends meetings of clubs or organizations: Not on file    Relationship status: Not on file  Other Topics Concern  . Not on file  Social History Narrative  . Not on file    Outpatient Encounter Medications as of 12/10/2018  Medication Sig  . Vitamin D, Ergocalciferol, (DRISDOL) 1.25 MG (50000 UT) CAPS capsule TAKE 1 CAPSULE (50,000 UNITS TOTAL) BY MOUTH ONCE A WEEK.  . [DISCONTINUED] Vitamin D, Ergocalciferol, (DRISDOL) 1.25 MG (50000 UT) CAPS capsule TAKE 1 CAPSULE (50,000 UNITS TOTAL) BY MOUTH ONCE A WEEK.   No facility-administered encounter medications on file as of 12/10/2018.     Review of Systems  Constitutional: Negative for appetite change and unexpected weight change.   HENT: Negative for congestion and sinus pressure.   Respiratory: Negative for cough, chest tightness and shortness of breath.   Cardiovascular: Negative for chest pain, palpitations and leg swelling.  Gastrointestinal: Negative for abdominal pain, diarrhea, nausea and vomiting.  Genitourinary: Negative for difficulty urinating and dysuria.  Musculoskeletal: Negative for joint swelling and myalgias.       Pain in feet - improved.    Skin: Negative for color change and rash.  Neurological: Negative for dizziness, light-headedness and headaches.  Psychiatric/Behavioral: Negative for agitation and dysphoric mood.       Increased stress as outlined.         Objective:    Physical Exam Constitutional:      General: She is not in acute distress.    Appearance: Normal appearance.  HENT:     Right Ear: External ear normal.     Left Ear: External ear normal.  Eyes:     General: No scleral icterus.       Right eye: No discharge.        Left eye: No discharge.     Conjunctiva/sclera: Conjunctivae normal.  Neck:     Musculoskeletal: Neck supple. No muscular tenderness.     Thyroid: No thyromegaly.  Cardiovascular:     Rate and Rhythm: Normal rate and regular rhythm.  Pulmonary:     Effort: No respiratory distress.     Breath sounds: Normal breath sounds. No wheezing.  Abdominal:     General: Bowel sounds are normal.     Palpations: Abdomen is soft.     Tenderness: There is no abdominal tenderness.  Musculoskeletal:        General: No swelling or tenderness.  Lymphadenopathy:     Cervical: No cervical adenopathy.  Skin:    Findings: No erythema or rash.  Neurological:     Mental Status: She is alert.  Psychiatric:        Mood and Affect: Mood normal.        Behavior: Behavior normal.     BP 122/78   Pulse 68   Temp 97.9 F (36.6 C)   Resp 16   Wt 228 lb (103.4 kg)   LMP 09/10/2016 (Exact Date)   SpO2 97%   BMI 36.80 kg/m  Wt Readings from Last 3 Encounters:   12/10/18 228 lb (103.4 kg)  06/16/18 241 lb (109.3 kg)  06/09/18 241 lb 6.4 oz (109.5 kg)     Lab Results  Component Value Date   WBC 6.2 12/07/2018   HGB 13.1 12/07/2018   HCT 40.0 12/07/2018   PLT 288.0 12/07/2018   GLUCOSE 90 12/07/2018   CHOL 191 12/07/2018   TRIG  57.0 12/07/2018   HDL 45.00 12/07/2018   LDLCALC 134 (H) 12/07/2018   ALT 13 12/07/2018   AST 16 12/07/2018   NA 144 12/07/2018   K 4.5 12/07/2018   CL 108 12/07/2018   CREATININE 0.73 12/07/2018   BUN 11 12/07/2018   CO2 29 12/07/2018   TSH 1.40 12/07/2018   HGBA1C 5.8 12/07/2018    Mm 3d Screen Breast Bilateral  Result Date: 04/24/2018 CLINICAL DATA:  Screening. EXAM: DIGITAL SCREENING BILATERAL MAMMOGRAM WITH TOMO AND CAD COMPARISON:  Previous exam(s). ACR Breast Density Category b: There are scattered areas of fibroglandular density. FINDINGS: There are no findings suspicious for malignancy. Images were processed with CAD. IMPRESSION: No mammographic evidence of malignancy. A result letter of this screening mammogram will be mailed directly to the patient. RECOMMENDATION: Screening mammogram in one year. (Code:SM-B-01Y) BI-RADS CATEGORY  1: Negative. Electronically Signed   By: Baird Lyonsina  Arceo M.D.   On: 04/24/2018 10:37       Assessment & Plan:   Problem List Items Addressed This Visit    Foot pain    Improved.  Follow.  Has seen podiatry.       Hypercholesterolemia    Discussed calculated cholesterol risk (8.5%).  Discussed recommendation to start a cholesterol medication.  She wants to think about starting.  Will call and let me know if she decides to start.        Stress    Discussed with her today.  Overall she feels she is handling things relatively well.  Follow.        Vaginal prolapse    Has pessary.  Followed by Dr Jerene PitchSchuman.       Vitamin D deficiency    Vitamin d level just checked - 29.26.  Continue weekly vitamin D x 12 more weeks and then start otc vitamin D3 2000 units per day.             Dale Durhamharlene Konrad Hoak, MD

## 2018-12-14 ENCOUNTER — Encounter: Payer: Self-pay | Admitting: Internal Medicine

## 2018-12-14 DIAGNOSIS — F439 Reaction to severe stress, unspecified: Secondary | ICD-10-CM | POA: Insufficient documentation

## 2018-12-14 NOTE — Assessment & Plan Note (Signed)
Discussed with her today.  Overall she feels she is handling things relatively well.  Follow.   

## 2018-12-14 NOTE — Assessment & Plan Note (Signed)
Vitamin d level just checked - 29.26.  Continue weekly vitamin D x 12 more weeks and then start otc vitamin D3 2000 units per day.

## 2018-12-14 NOTE — Assessment & Plan Note (Signed)
Discussed calculated cholesterol risk (8.5%).  Discussed recommendation to start a cholesterol medication.  She wants to think about starting.  Will call and let me know if she decides to start.

## 2018-12-14 NOTE — Assessment & Plan Note (Signed)
Improved.  Follow.  Has seen podiatry.

## 2018-12-14 NOTE — Assessment & Plan Note (Signed)
Has pessary.  Followed by Dr Gilman Schmidt.

## 2019-02-18 DIAGNOSIS — Z8249 Family history of ischemic heart disease and other diseases of the circulatory system: Secondary | ICD-10-CM | POA: Diagnosis not present

## 2019-02-18 DIAGNOSIS — Z803 Family history of malignant neoplasm of breast: Secondary | ICD-10-CM | POA: Diagnosis not present

## 2019-02-18 DIAGNOSIS — K5900 Constipation, unspecified: Secondary | ICD-10-CM | POA: Diagnosis not present

## 2019-02-18 DIAGNOSIS — E559 Vitamin D deficiency, unspecified: Secondary | ICD-10-CM | POA: Diagnosis not present

## 2019-02-18 DIAGNOSIS — Z809 Family history of malignant neoplasm, unspecified: Secondary | ICD-10-CM | POA: Diagnosis not present

## 2019-03-29 ENCOUNTER — Other Ambulatory Visit: Payer: Self-pay | Admitting: Internal Medicine

## 2019-04-07 ENCOUNTER — Other Ambulatory Visit: Payer: Self-pay | Admitting: Internal Medicine

## 2019-04-07 DIAGNOSIS — Z1231 Encounter for screening mammogram for malignant neoplasm of breast: Secondary | ICD-10-CM

## 2019-05-07 ENCOUNTER — Telehealth: Payer: Self-pay | Admitting: Internal Medicine

## 2019-05-07 ENCOUNTER — Telehealth: Payer: Self-pay | Admitting: Lab

## 2019-05-07 NOTE — Telephone Encounter (Signed)
Called Pt back Pt stated she went to [facility] pharmacy and they prescribed her to take one of each daily Vitamin C 1000mg, Zinc 50mg, Vitamin D3 2000mg. Pt was informed from the pharmacy to rest and to also move around and stay in the house until 05/17/2019. Pt stated she is not having any symptoms, and she feels she doesn't need a visit. Pt also stated if there is something else that will be discussed and she really need the visit she will do it. 

## 2019-05-07 NOTE — Telephone Encounter (Signed)
Called Pt back Pt stated she went to [facility] pharmacy and they prescribed her to take one of each daily Vitamin C 1000mg , Zinc 50mg , Vitamin D3 2000mg . Pt was informed from the pharmacy to rest and to also move around and stay in the house until 05/17/2019. Pt stated she is not having any symptoms, and she feels she doesn't need a visit. Pt also stated if there is something else that will be discussed and she really need the visit she will do it.

## 2019-05-07 NOTE — Telephone Encounter (Signed)
Pt called office Pt tested positive for Covid. She said she has no symptoms. She said she would like to know if there is something she should be taking to help build her immune system. Her husband is not doing well in the hospital with Covid. She asking for a call back today please.

## 2019-05-07 NOTE — Telephone Encounter (Signed)
Noted. Thank you for calling 

## 2019-05-07 NOTE — Telephone Encounter (Signed)
If she is agreeable, please add her to my schedule today - doxy appt.  You can add her at 4:30.  Thanks

## 2019-05-07 NOTE — Telephone Encounter (Signed)
Pt tested positive for Covid. She said she has no symptoms. She said she would like to know if there is something she should be taking to help build her immune system. Her husband is not doing well in the hospital with Covid. She asking for a call back today please.

## 2019-06-07 ENCOUNTER — Ambulatory Visit
Admission: RE | Admit: 2019-06-07 | Discharge: 2019-06-07 | Disposition: A | Discharge status: Home / Self Care | Payer: Medicare HMO | Source: Ambulatory Visit | Attending: Internal Medicine | Admitting: Internal Medicine

## 2019-06-07 DIAGNOSIS — Z1231 Encounter for screening mammogram for malignant neoplasm of breast: Secondary | ICD-10-CM | POA: Diagnosis not present

## 2019-06-09 ENCOUNTER — Telehealth: Payer: Self-pay | Admitting: *Deleted

## 2019-06-09 DIAGNOSIS — R739 Hyperglycemia, unspecified: Secondary | ICD-10-CM

## 2019-06-09 DIAGNOSIS — E7800 Pure hypercholesterolemia, unspecified: Secondary | ICD-10-CM

## 2019-06-09 DIAGNOSIS — E559 Vitamin D deficiency, unspecified: Secondary | ICD-10-CM

## 2019-06-09 NOTE — Telephone Encounter (Signed)
Please place future orders for lab appt.  

## 2019-06-10 NOTE — Telephone Encounter (Signed)
Order placed for f/u labs.  

## 2019-06-11 ENCOUNTER — Other Ambulatory Visit (INDEPENDENT_AMBULATORY_CARE_PROVIDER_SITE_OTHER): Payer: Medicare HMO

## 2019-06-11 ENCOUNTER — Other Ambulatory Visit: Payer: Self-pay

## 2019-06-11 DIAGNOSIS — E559 Vitamin D deficiency, unspecified: Secondary | ICD-10-CM | POA: Diagnosis not present

## 2019-06-11 DIAGNOSIS — R739 Hyperglycemia, unspecified: Secondary | ICD-10-CM | POA: Diagnosis not present

## 2019-06-11 DIAGNOSIS — E7800 Pure hypercholesterolemia, unspecified: Secondary | ICD-10-CM | POA: Diagnosis not present

## 2019-06-11 LAB — LIPID PANEL
Cholesterol: 166 mg/dL (ref 0–200)
HDL: 47.5 mg/dL (ref >39.00)
LDL Cholesterol: 108 mg/dL — ABNORMAL HIGH (ref 0–99)
NonHDL: 118
Total CHOL/HDL Ratio: 3
Triglycerides: 52 mg/dL (ref 0.0–149.0)
VLDL: 10.4 mg/dL (ref 0.0–40.0)

## 2019-06-11 LAB — BASIC METABOLIC PANEL
BUN: 12 mg/dL (ref 6–23)
CO2: 29 mEq/L (ref 19–32)
Calcium: 9.4 mg/dL (ref 8.4–10.5)
Chloride: 107 mEq/L (ref 96–112)
Creatinine, Ser: 0.67 mg/dL (ref 0.40–1.20)
GFR: 106.23 mL/min (ref >60.00)
Glucose, Bld: 91 mg/dL (ref 70–99)
Potassium: 4 mEq/L (ref 3.5–5.1)
Sodium: 141 mEq/L (ref 135–145)

## 2019-06-11 LAB — HEPATIC FUNCTION PANEL
ALT: 13 U/L (ref 0–35)
AST: 14 U/L (ref 0–37)
Albumin: 3.6 g/dL (ref 3.5–5.2)
Alkaline Phosphatase: 75 U/L (ref 39–117)
Bilirubin, Direct: 0.2 mg/dL (ref 0.0–0.3)
Total Bilirubin: 0.8 mg/dL (ref 0.2–1.2)
Total Protein: 7 g/dL (ref 6.0–8.3)

## 2019-06-11 LAB — HEMOGLOBIN A1C: Hgb A1c MFr Bld: 5.7 % (ref 4.6–6.5)

## 2019-06-11 LAB — VITAMIN D 25 HYDROXY (VIT D DEFICIENCY, FRACTURES): VITD: 29.33 ng/mL — ABNORMAL LOW (ref 30.00–100.00)

## 2019-06-14 ENCOUNTER — Other Ambulatory Visit: Payer: Self-pay

## 2019-06-14 ENCOUNTER — Telehealth: Payer: Self-pay | Admitting: Internal Medicine

## 2019-06-14 ENCOUNTER — Ambulatory Visit (INDEPENDENT_AMBULATORY_CARE_PROVIDER_SITE_OTHER): Payer: Medicare HMO | Admitting: Internal Medicine

## 2019-06-14 VITALS — BP 110/70 | HR 81 | Temp 97.4°F | Resp 16 | Ht 66.0 in | Wt 217.0 lb

## 2019-06-14 DIAGNOSIS — M79673 Pain in unspecified foot: Secondary | ICD-10-CM | POA: Diagnosis not present

## 2019-06-14 DIAGNOSIS — F439 Reaction to severe stress, unspecified: Secondary | ICD-10-CM

## 2019-06-14 DIAGNOSIS — Z1211 Encounter for screening for malignant neoplasm of colon: Secondary | ICD-10-CM | POA: Diagnosis not present

## 2019-06-14 DIAGNOSIS — R739 Hyperglycemia, unspecified: Secondary | ICD-10-CM

## 2019-06-14 DIAGNOSIS — Z0000 Encounter for general adult medical examination without abnormal findings: Secondary | ICD-10-CM

## 2019-06-14 DIAGNOSIS — E7800 Pure hypercholesterolemia, unspecified: Secondary | ICD-10-CM

## 2019-06-14 DIAGNOSIS — E559 Vitamin D deficiency, unspecified: Secondary | ICD-10-CM

## 2019-06-14 NOTE — Progress Notes (Signed)
[name], female   DOB: 06-16-1950, 69 y.o.   MRN: [id number]   Subjective:    [name], female    DOB: [birth year], 69 y.o.   MRN: [id number]  HPI This visit occurred during the SARS-CoV-2 public health emergency.  Safety protocols were in place, including screening questions prior to the visit, additional usage of staff PPE, and extensive cleaning of exam room while observing appropriate contact time as indicated for disinfecting solutions.  Patient here for her physical exam.  She reports she is doing relatively well.  Husband passed away recently.  She has good support.  Does not feel needs anything at this time.  Trying to stay active.  No chest pain.  No sob.  No acid reflux. No abdominal pain.  Bowels moving.  Discussed labs.  Discussed cholesterol - improved.  Calculated cholesterol risk - 5.8%.  Some pain - left bunion/callous.  Has seen podiatry.  Wanted to hold on any further intervention at this time.  Would have to be off her feet.  She is caring for her mother.  Sees gyn for pessary check.    Past Medical History:  Diagnosis Date  . Complication of anesthesia   . Degenerative disc disease, cervical    cervical  . Hypercholesterolemia   . Meralgia paraesthetica   . Migraines   . Nephrolithiasis   . Neurodermatitis   . Obesity   . Osteopenia   . PONV (postoperative nausea and vomiting)   . Vitamin D deficiency    Past Surgical History:  Procedure Laterality Date  . CHOLECYSTECTOMY N/A 04/24/2015   Procedure: LAPAROSCOPIC CHOLECYSTECTOMY WITH INTRAOPERATIVE CHOLANGIOGRAM;  Surgeon: Robert Bellow, MD;  Location: [facility];  Service: General;  Laterality: N/A;  . EXTRACORPOREAL SHOCK WAVE LITHOTRIPSY Right 11/17/2014   Procedure: EXTRACORPOREAL SHOCK WAVE LITHOTRIPSY (ESWL);  Surgeon: Royston Cowper, MD;  Location: [facility];  Service: Urology;  Laterality: Right;  . EXTRACORPOREAL SHOCK WAVE LITHOTRIPSY Left 12/15/2014   Procedure: EXTRACORPOREAL  SHOCK WAVE LITHOTRIPSY (ESWL);  Surgeon: Royston Cowper, MD;  Location: [facility];  Service: Urology;  Laterality: Left;  . laparoscopic surgery  1990   nephrolithiasis (Dr Ernst Spell)  . LITHOTRIPSY  2010, 1980's  . pessary  2003, 2016   Family History  Problem Relation Age of Onset  . Hypertension Mother   . Hypertension Father   . Asthma Sister   . Hypertension Sister   . Diabetes Sister   . Breast cancer Sister 33  . Rectal cancer Sister    Social History   Socioeconomic History  . Marital status: Married    Spouse name: Not on file  . Number of children: 1  . Years of education: Not on file  . Highest education level: Not on file  Occupational History  . Not on file  Tobacco Use  . Smoking status: Never Smoker  . Smokeless tobacco: Never Used  Substance and Sexual Activity  . Alcohol use: No    Alcohol/week: 0.0 standard drinks  . Drug use: No  . Sexual activity: Not Currently    Birth control/protection: Surgical  Other Topics Concern  . Not on file  Social History Narrative  . Not on file   Social Determinants of Health   Financial Resource Strain: Low Risk   . Difficulty of Paying Living Expenses: Not hard at all  Food Insecurity: No Food Insecurity  . Worried About Charity fundraiser in the Last Year: Never true  .  Ran Out of Food in the Last Year: Never true  Transportation Needs: No Transportation Needs  . Lack of Transportation (Medical): No  . Lack of Transportation (Non-Medical): No  Physical Activity: Insufficiently Active  . Days of Exercise per Week: 2 days  . Minutes of Exercise per Session: 30 min  Stress: No Stress Concern Present  . Feeling of Stress : Not at all  Social Connections:   . Frequency of Communication with Friends and Family:   . Frequency of Social Gatherings with Friends and Family:   . Attends Religious Services:   . Active Member of Clubs or Organizations:   . Attends Archivist Meetings:   Marland Kitchen Marital Status:      Outpatient Encounter Medications as of 06/14/2019  Medication Sig  . [DISCONTINUED] Vitamin D, Ergocalciferol, (DRISDOL) 1.25 MG (50000 UT) CAPS capsule TAKE 1 CAPSULE BY MOUTH ONE TIME PER WEEK   No facility-administered encounter medications on file as of 06/14/2019.    Review of Systems  Constitutional: Negative for appetite change and unexpected weight change.  HENT: Negative for congestion and sinus pressure.   Eyes: Negative for pain and visual disturbance.  Respiratory: Negative for cough, chest tightness and shortness of breath.   Cardiovascular: Negative for chest pain, palpitations and leg swelling.  Gastrointestinal: Negative for abdominal pain, diarrhea, nausea and vomiting.  Genitourinary: Negative for difficulty urinating and dysuria.  Musculoskeletal: Negative for joint swelling and myalgias.       Foot pain as outlined.    Skin: Negative for color change and rash.  Neurological: Negative for dizziness and headaches.  Hematological: Negative for adenopathy. Does not bruise/bleed easily.  Psychiatric/Behavioral: Negative for agitation and dysphoric mood.       Objective:    Physical Exam Constitutional:      General: She is not in acute distress.    Appearance: Normal appearance. She is well-developed.  HENT:     Head: Normocephalic and atraumatic.     Right Ear: External ear normal.     Left Ear: External ear normal.  Eyes:     General: No scleral icterus.       Right eye: No discharge.        Left eye: No discharge.     Conjunctiva/sclera: Conjunctivae normal.  Neck:     Thyroid: No thyromegaly.  Cardiovascular:     Rate and Rhythm: Normal rate and regular rhythm.  Pulmonary:     Effort: No tachypnea, accessory muscle usage or respiratory distress.     Breath sounds: Normal breath sounds. No decreased breath sounds or wheezing.  Chest:     Breasts:        Right: No inverted nipple, mass, nipple discharge or tenderness (no axillary adenopathy).         Left: No inverted nipple, mass, nipple discharge or tenderness (no axilarry adenopathy).  Abdominal:     General: Bowel sounds are normal.     Palpations: Abdomen is soft.     Tenderness: There is no abdominal tenderness.  Musculoskeletal:        General: No swelling or tenderness.     Cervical back: Neck supple. No tenderness.  Lymphadenopathy:     Cervical: No cervical adenopathy.  Skin:    Findings: No erythema or rash.  Neurological:     Mental Status: She is alert and oriented to person, place, and time.  Psychiatric:        Mood and Affect: Mood normal.  Behavior: Behavior normal.     BP 110/70   Pulse 81   Temp (!) 97.4 F (36.3 C)   Resp 16   Ht '5\' 6"'$  (1.676 m)   Wt 217 lb (98.4 kg)   LMP 09/10/2016 (Exact Date)   SpO2 97%   BMI 35.02 kg/m  Wt Readings from Last 3 Encounters:  06/14/19 217 lb (98.4 kg)  12/10/18 228 lb (103.4 kg)  06/16/18 241 lb (109.3 kg)     Lab Results  Component Value Date   WBC 6.2 12/07/2018   HGB 13.1 12/07/2018   HCT 40.0 12/07/2018   PLT 288.0 12/07/2018   GLUCOSE 91 06/11/2019   CHOL 166 06/11/2019   TRIG 52.0 06/11/2019   HDL 47.50 06/11/2019   LDLCALC 108 (H) 06/11/2019   ALT 13 06/11/2019   AST 14 06/11/2019   NA 141 06/11/2019   K 4.0 06/11/2019   CL 107 06/11/2019   CREATININE 0.67 06/11/2019   BUN 12 06/11/2019   CO2 29 06/11/2019   TSH 1.40 12/07/2018   HGBA1C 5.7 06/11/2019    MM 3D SCREEN BREAST BILATERAL  Result Date: 06/07/2019 CLINICAL DATA:  Screening. EXAM: DIGITAL SCREENING BILATERAL MAMMOGRAM WITH TOMO AND CAD COMPARISON:  Previous exam(s). ACR Breast Density Category b: There are scattered areas of fibroglandular density. FINDINGS: There are no findings suspicious for malignancy. Images were processed with CAD. IMPRESSION: No mammographic evidence of malignancy. A result letter of this screening mammogram will be mailed directly to the patient. RECOMMENDATION: Screening mammogram in one year.  (Code:SM-B-01Y) BI-RADS CATEGORY  1: Negative. Electronically Signed   By: Curlene Dolphin M.D.   On: 06/07/2019 11:05       Assessment & Plan:   Problem List Items Addressed This Visit    Foot pain    Persistent.  Has seen podiatry previously.  Wants to hold on any further evaluation at this time.  Follow.       Health care maintenance    Physical today 06/14/19.  Followed by gyn.  Mammogram 06/07/19 - Birads I.  Colonoscopy 2014.  Hemoccult cards given.       Hypercholesterolemia    Calculated cholesterol risk - 5.8%.  Hold on statin medication.  Low cholesterol diet and exercise.  Follow lipid panel and liver function tests.        Relevant Orders   CBC with Differential/Platelet   Comprehensive metabolic panel   Lipid panel   TSH   Hyperglycemia    Low carb diet and exercise.  Follow met b and a1c.       Relevant Orders   Hemoglobin A1c   Stress    Increased stress as outlined.  Has good support.  Does not feel she needs any further intervention.        Vitamin D deficiency    Follow vitamin D level.        Other Visit Diagnoses    Routine general medical examination at a health care facility    -  Primary   Colon cancer screening       Relevant Orders   Fecal occult blood, imunochemical (Completed)       Einar Pheasant, MD

## 2019-06-14 NOTE — Assessment & Plan Note (Addendum)
Physical today 06/14/19.  Followed by gyn.  Mammogram 06/07/19 - Birads I.  Colonoscopy 2014.  Hemoccult cards given.

## 2019-06-14 NOTE — Telephone Encounter (Signed)
noted 

## 2019-06-14 NOTE — Telephone Encounter (Signed)
Pt called to tell PCP that she is taking D3 2000 iu 50 mg

## 2019-06-15 ENCOUNTER — Other Ambulatory Visit: Payer: Medicare HMO

## 2019-06-16 ENCOUNTER — Other Ambulatory Visit (INDEPENDENT_AMBULATORY_CARE_PROVIDER_SITE_OTHER): Payer: Medicare HMO

## 2019-06-16 DIAGNOSIS — Z1211 Encounter for screening for malignant neoplasm of colon: Secondary | ICD-10-CM

## 2019-06-16 LAB — FECAL OCCULT BLOOD, IMMUNOCHEMICAL: Fecal Occult Bld: NEGATIVE

## 2019-06-20 ENCOUNTER — Encounter: Payer: Self-pay | Admitting: Internal Medicine

## 2019-06-20 DIAGNOSIS — R739 Hyperglycemia, unspecified: Secondary | ICD-10-CM | POA: Insufficient documentation

## 2019-06-20 NOTE — Assessment & Plan Note (Signed)
Low carb diet and exercise.  Follow met b and a1c.  

## 2019-06-20 NOTE — Assessment & Plan Note (Signed)
Increased stress as outlined.  Has good support.  Does not feel she needs any further intervention.

## 2019-06-20 NOTE — Assessment & Plan Note (Signed)
Persistent.  Has seen podiatry previously.  Wants to hold on any further evaluation at this time.  Follow.

## 2019-06-20 NOTE — Assessment & Plan Note (Signed)
Follow vitamin D level.  

## 2019-06-20 NOTE — Assessment & Plan Note (Signed)
Calculated cholesterol risk - 5.8%.  Hold on statin medication.  Low cholesterol diet and exercise.  Follow lipid panel and liver function tests.

## 2019-07-20 ENCOUNTER — Telehealth: Payer: Self-pay | Admitting: Internal Medicine

## 2019-07-20 NOTE — Telephone Encounter (Signed)
Advised patient to follow PCP advice of waiting 90 days to receive vaccine for  COVID infection patient agreed to wait the 90 days. CDC new guidelines state 28 days but I advised patient that the original guideline was 90 days.

## 2019-07-20 NOTE — Telephone Encounter (Signed)
Pt called she wanted to know if she could get a Covid vaccine after 45 day that is what the site is telling her

## 2019-08-04 ENCOUNTER — Encounter (INDEPENDENT_AMBULATORY_CARE_PROVIDER_SITE_OTHER): Payer: Self-pay

## 2019-08-04 ENCOUNTER — Ambulatory Visit (INDEPENDENT_AMBULATORY_CARE_PROVIDER_SITE_OTHER): Payer: Medicare HMO

## 2019-08-04 VITALS — Ht 66.0 in | Wt 217.0 lb

## 2019-08-04 DIAGNOSIS — Z0000 Encounter for general adult medical examination without abnormal findings: Secondary | ICD-10-CM | POA: Diagnosis not present

## 2019-08-04 DIAGNOSIS — Z780 Asymptomatic menopausal state: Secondary | ICD-10-CM

## 2019-08-04 DIAGNOSIS — Z1159 Encounter for screening for other viral diseases: Secondary | ICD-10-CM

## 2019-08-04 NOTE — Patient Instructions (Addendum)
Nicole Hardy , Thank you for taking time to come for your Medicare Wellness Visit. I appreciate your ongoing commitment to your health goals. Please review the following plan we discussed and let me know if I can assist you in the future.   These are the goals we discussed: Goals      Patient Stated   . I want to lose about 5 lbs (pt-stated)     Healthy snacking. Encouraged high protein, low carb.  Stay active       This is a list of the screening recommended for you and due dates:  Health Maintenance  Topic Date Due  .  Hepatitis C: One time screening is recommended by Center for Disease Control  (CDC) for  adults born from [birth year] through 1963.   Never done  . Tetanus Vaccine  Never done  . DEXA scan (bone density measurement)  Never done  . Pneumonia vaccines (1 of 2 - PCV13) Never done  . COVID-19 Vaccine (2 - Pfizer 2-dose series) 08/24/2019  . Mammogram  06/06/2021  . Colon Cancer Screening  07/24/2022  . Flu Shot  Discontinued    Bone Density Test The bone density test uses a special type of X-ray to measure the amount of calcium and other minerals in your bones. It can measure bone density in the hip and the spine. The test procedure is similar to having a regular X-ray. This test may also be called:  Bone densitometry.  Bone mineral density test.  Dual-energy X-ray absorptiometry (DEXA). You may have this test to:  Diagnose a condition that causes weak or thin bones (osteoporosis).  Screen you for osteoporosis.  Predict your risk for a broken bone (fracture).  Determine how well your osteoporosis treatment is working. Tell a health care provider about:  Any allergies you have.  All medicines you are taking, including vitamins, herbs, eye drops, creams, and over-the-counter medicines.  Any problems you or family members have had with anesthetic medicines.  Any blood disorders you have.  Any surgeries you have had.  Any medical conditions you  have.  Whether you are pregnant or may be pregnant.  Any medical tests you have had within the past 14 days that used contrast material. What are the risks? Generally, this is a safe procedure. However, it does expose you to a small amount of radiation, which can slightly increase your cancer risk. What happens before the procedure?  Do not take any calcium supplements starting 24 hours before your test.  Remove all metal jewelry, eyeglasses, dental appliances, and any other metal objects. What happens during the procedure?   You will lie down on an exam table. There will be an X-ray generator below you and an imaging device above you.  Other devices, such as boxes or braces, may be used to position your body properly for the scan.  The machine will slowly scan your body. You will need to keep still.  The images will show up on a screen in the room. Images will be examined by a specialist after your test is done. The procedure may vary among health care providers and hospitals. What happens after the procedure?  It is up to you to get your test results. Ask your health care provider, or the department that is doing the test, when your results will be ready. Summary  A bone density test is an imaging test that uses a type of X-ray to measure the amount of calcium and other minerals  in your bones.  The test may be used to diagnose or screen you for a condition that causes weak or thin bones (osteoporosis), predict your risk for a broken bone (fracture), or determine how well your osteoporosis treatment is working.  Do not take any calcium supplements starting 24 hours before your test.  Ask your health care provider, or the department that is doing the test, when your results will be ready. This information is not intended to replace advice given to you by your health care provider. Make sure you discuss any questions you have with your health care provider. Document Revised:  04/10/2017 Document Reviewed: 01/27/2017 Elsevier Patient Education  2020 [employer].

## 2019-08-04 NOTE — Progress Notes (Addendum)
Subjective:   Nicole Hardy is a 69 y.o. female who presents for Medicare Annual (Subsequent) preventive examination.  Review of Systems:  No ROS.  Medicare Wellness Virtual Visit.  Visual/audio telehealth visit, UTA vital signs.   Ht/Wt provided.  See social history for additional risk factors.  Cardiac Risk Factors include: advanced age (>44men, >27 women)     Objective:     Vitals: Ht 5\' 6"  (1.676 m)   Wt 217 lb (98.4 kg)   LMP 09/10/2016 (Exact Date)   BMI 35.02 kg/m   Body mass index is 35.02 kg/m.  Advanced Directives 08/04/2019 08/03/2018 04/24/2015 04/18/2015 11/17/2014  Does Patient Have a Medical Advance Directive? No No No Yes;No No  Does patient want to make changes to medical advance directive? No - Patient declined - - No - Patient declined -  Would patient like information on creating a medical advance directive? - No - Patient declined - - No - patient declined information    Tobacco Social History   Tobacco Use  Smoking Status Never Smoker  Smokeless Tobacco Never Used     Counseling given: Not Answered   Clinical Intake:  Pre-visit preparation completed: Yes        Diabetes: No  How often do you need to have someone help you when you read instructions, pamphlets, or other written materials from your doctor or pharmacy?: 1 - Never  Interpreter Needed?: No     Past Medical History:  Diagnosis Date  . Complication of anesthesia   . Degenerative disc disease, cervical    cervical  . Hypercholesterolemia   . Meralgia paraesthetica   . Migraines   . Nephrolithiasis   . Neurodermatitis   . Obesity   . Osteopenia   . PONV (postoperative nausea and vomiting)   . Vitamin D deficiency    Past Surgical History:  Procedure Laterality Date  . CHOLECYSTECTOMY N/A 04/24/2015   Procedure: LAPAROSCOPIC CHOLECYSTECTOMY WITH INTRAOPERATIVE CHOLANGIOGRAM;  Surgeon: 04/26/2015, MD;  Location: [facility] ORS;  Service: General;  Laterality: N/A;  .  EXTRACORPOREAL SHOCK WAVE LITHOTRIPSY Right 11/17/2014   Procedure: EXTRACORPOREAL SHOCK WAVE LITHOTRIPSY (ESWL);  Surgeon: 01/17/2015, MD;  Location: [facility] ORS;  Service: Urology;  Laterality: Right;  . EXTRACORPOREAL SHOCK WAVE LITHOTRIPSY Left 12/15/2014   Procedure: EXTRACORPOREAL SHOCK WAVE LITHOTRIPSY (ESWL);  Surgeon: 02/14/2015, MD;  Location: [facility] ORS;  Service: Urology;  Laterality: Left;  . laparoscopic surgery  1990   nephrolithiasis (Dr Orson Ape)  . LITHOTRIPSY  2010, 1980's  . pessary  2003, 2016   Family History  Problem Relation Age of Onset  . Hypertension Mother   . Hypertension Father   . Asthma Sister   . Hypertension Sister   . Diabetes Sister   . Breast cancer Sister 74  . Rectal cancer Sister    Social History   Socioeconomic History  . Marital status: Married    Spouse name: Not on file  . Number of children: 1  . Years of education: Not on file  . Highest education level: Not on file  Occupational History  . Not on file  Tobacco Use  . Smoking status: Never Smoker  . Smokeless tobacco: Never Used  Substance and Sexual Activity  . Alcohol use: No    Alcohol/week: 0.0 standard drinks  . Drug use: No  . Sexual activity: Not Currently    Birth control/protection: Surgical  Other Topics Concern  . Not on file  Social History Narrative  .  Not on file   Social Determinants of Health   Financial Resource Strain:   . Difficulty of Paying Living Expenses:   Food Insecurity:   . Worried About Programme researcher, broadcasting/film/video in the Last Year:   . Barista in the Last Year:   Transportation Needs:   . Freight forwarder (Medical):   Marland Kitchen Lack of Transportation (Non-Medical):   Physical Activity:   . Days of Exercise per Week:   . Minutes of Exercise per Session:   Stress:   . Feeling of Stress :   Social Connections:   . Frequency of Communication with Friends and Family:   . Frequency of Social Gatherings with Friends and Family:   . Attends  Religious Services:   . Active Member of Clubs or Organizations:   . Attends Banker Meetings:   Marland Kitchen Marital Status:     No outpatient encounter medications on file as of 08/04/2019.   No facility-administered encounter medications on file as of 08/04/2019.    Activities of Daily Living In your present state of health, do you have any difficulty performing the following activities: 08/04/2019  Hearing? N  Vision? N  Difficulty concentrating or making decisions? N  Walking or climbing stairs? N  Dressing or bathing? N  Doing errands, shopping? N  Preparing Food and eating ? N  Using the Toilet? N  In the past six months, have you accidently leaked urine? Y  Comment Pessary in place. Managed with daily pad.  Do you have problems with loss of bowel control? N  Managing your Medications? N  Managing your Finances? N  Housekeeping or managing your Housekeeping? N  Some recent data might be hidden    Patient Care Team: Dale [place], MD as PCP - General (Internal Medicine) Lemar Livings, Merrily Pew, MD as Consulting Physician (General Surgery) Dale [place], MD as Referring Physician (Internal Medicine)    Assessment:   This is a routine wellness examination for [place].  Nurse connected with patient 08/04/19 at 11:30 AM EDT by a telephone enabled telemedicine application and verified that I am speaking with the correct person using two identifiers. Patient stated full name and DOB. Patient gave permission to continue with virtual visit. Patient's location was at home and Nurse's location was at [place] office.   Patient is alert and oriented x3. Patient denies difficulty focusing or concentrating. Patient likes to read, listen to gospel music and watch stress free television for brain health.  Health Maintenance Due: -PNA and Tdap vaccine- discussed; to be completed with doctor in visit or local pharmacy a minimum of 4 weeks after second Covid vaccine. -Covid vaccine- first  dose administered 08/03/19. -Hepatitis C Screening- consent given; added to previous lab order.  -Dexa Scan- requests information mailed, then will plan to schedule. Ordered for future scheduling.  See completed HM at the end of note.   Eye: Visual acuity not assessed. Virtual visit. Followed by their ophthalmologist.  Dental: Visits every 3-6 months.  Next visit scheduled visit 08/24/19.   Hearing: Demonstrates normal hearing during visit.  Safety:  Patient feels safe at home- yes Patient does have smoke detectors at home- yes Patient does wear sunscreen or protective clothing when in direct sunlight - yes Patient does wear seat belt when in a moving vehicle - yes Patient drives- yes Adequate lighting in walkways free from debris- yes Grab bars and handrails used as appropriate- yes Ambulates with an assistive device- no Cell phone on person  when ambulating outside of the home-yes  Social: Alcohol intake - no       Smoking history- never   Smokers in home? none Illicit drug use? none  Medication: No medication on file.   Covid-19: Precautions and sickness symptoms discussed. Wears mask, social distancing, hand hygiene as appropriate.   Activities of Daily Living Patient denies needing assistance with: household chores, feeding themselves, getting from bed to chair, getting to the toilet, bathing/showering, dressing, managing money, or preparing meals.   Discussed the importance of a healthy diet, water intake and the benefits of aerobic exercise.   Physical activity- stretching, stationary bike, walking, 2-3 times weekly.  Diet:  Healthy Water: good intake Caffeine: none  Other Providers Patient Care Team: Einar Pheasant, MD as PCP - General (Internal Medicine) Bary Castilla, Forest Gleason, MD as Consulting Physician (General Surgery) Einar Pheasant, MD as Referring Physician (Internal Medicine)  Exercise Activities and Dietary recommendations Current Exercise Habits:  Home exercise routine, Type of exercise: stretching;calisthenics;walking, Frequency (Times/Week): 3, Intensity: Mild  Goals      Patient Stated   . I want to lose about 5 lbs (pt-stated)     Healthy snacking. Encouraged high protein, low carb.  Stay active       Fall Risk Fall Risk  08/04/2019 08/03/2018 06/09/2018 01/08/2017 12/22/2015  Falls in the past year? 0 0 0 No No  Follow up Falls evaluation completed - - - -   Timed Get Up and Go performed: no, virtual visit   Depression Screen PHQ 2/9 Scores 08/04/2019 08/03/2018 06/09/2018 01/08/2017  PHQ - 2 Score 0 0 0 0  PHQ- 9 Score - 0 0 -     Cognitive Function     6CIT Screen 08/04/2019 08/03/2018  What Year? 0 points 0 points  What month? 0 points 0 points  What time? 0 points 0 points  Count back from 20 - 0 points  Months in reverse - 0 points  Repeat phrase - 0 points  Total Score - 0    Immunization History  Administered Date(s) Administered  . PFIZER SARS-COV-2 Vaccination 08/03/2019  . PPD Test 12/25/2015, 11/20/2016   Screening Tests Health Maintenance  Topic Date Due  . Hepatitis C Screening  Never done  . TETANUS/TDAP  Never done  . DEXA SCAN  Never done  . PNA vac Low Risk Adult (1 of 2 - PCV13) Never done  . COVID-19 Vaccine (2 - Pfizer 2-dose series) 08/24/2019  . MAMMOGRAM  06/06/2021  . COLONOSCOPY  07/24/2022  . INFLUENZA VACCINE  Discontinued      Plan:   Keep all routine maintenance appointments.   Next scheduled fasting lab 12/10/19  Follow up 12/20/19   Bone density information mailed per patient request. She will plan to schedule at a later date. Order placed.   Hep C screening- consent given. Future order added.   Medicare Attestation I have personally reviewed: The patient's medical and social history Their use of alcohol, tobacco or illicit drugs Their current medications and supplements The patient's functional ability including ADLs,fall risks, home safety risks, cognitive, and  hearing and visual impairment Diet and physical activities Evidence for depression   I have reviewed and discussed with patient certain preventive protocols, quality metrics, and best practice recommendations.      Varney Biles, LPN  07/20/2438   Reviewed above information.  Agree with assessment and plan.    Dr Nicki Reaper

## 2019-10-14 ENCOUNTER — Encounter: Payer: Self-pay | Admitting: Internal Medicine

## 2019-11-02 ENCOUNTER — Ambulatory Visit
Admission: RE | Admit: 2019-11-02 | Discharge: 2019-11-02 | Disposition: A | Discharge status: Home / Self Care | Payer: Medicare HMO | Source: Ambulatory Visit | Attending: Internal Medicine | Admitting: Internal Medicine

## 2019-11-02 DIAGNOSIS — Z780 Asymptomatic menopausal state: Secondary | ICD-10-CM | POA: Insufficient documentation

## 2019-12-07 ENCOUNTER — Telehealth: Payer: Self-pay | Admitting: Internal Medicine

## 2019-12-07 DIAGNOSIS — Z789 Other specified health status: Secondary | ICD-10-CM

## 2019-12-07 NOTE — Telephone Encounter (Signed)
Pt wanted to add a antibody covid lab to her appt on 9/2

## 2019-12-07 NOTE — Telephone Encounter (Signed)
Are you ok to add this on for her?

## 2019-12-08 NOTE — Addendum Note (Signed)
Addended by: Charm Barges on: 12/08/2019 05:03 AM   Modules accepted: Orders

## 2019-12-08 NOTE — Telephone Encounter (Signed)
I have place order for covid antibody test.

## 2019-12-10 ENCOUNTER — Other Ambulatory Visit: Payer: Self-pay

## 2019-12-10 ENCOUNTER — Other Ambulatory Visit (INDEPENDENT_AMBULATORY_CARE_PROVIDER_SITE_OTHER): Payer: Medicare HMO

## 2019-12-10 DIAGNOSIS — R739 Hyperglycemia, unspecified: Secondary | ICD-10-CM

## 2019-12-10 DIAGNOSIS — Z789 Other specified health status: Secondary | ICD-10-CM

## 2019-12-10 DIAGNOSIS — E7800 Pure hypercholesterolemia, unspecified: Secondary | ICD-10-CM

## 2019-12-10 DIAGNOSIS — Z1159 Encounter for screening for other viral diseases: Secondary | ICD-10-CM

## 2019-12-10 LAB — CBC WITH DIFFERENTIAL/PLATELET
Basophils Absolute: 0 10*3/uL (ref 0.0–0.1)
Basophils Relative: 0.9 % (ref 0.0–3.0)
Eosinophils Absolute: 0.1 10*3/uL (ref 0.0–0.7)
Eosinophils Relative: 1.7 % (ref 0.0–5.0)
HCT: 40.5 % (ref 36.0–46.0)
Hemoglobin: 13.3 g/dL (ref 12.0–15.0)
Lymphocytes Relative: 34.3 % (ref 12.0–46.0)
Lymphs Abs: 1.7 10*3/uL (ref 0.7–4.0)
MCHC: 32.8 g/dL (ref 30.0–36.0)
MCV: 92.6 fl (ref 78.0–100.0)
Monocytes Absolute: 0.3 10*3/uL (ref 0.1–1.0)
Monocytes Relative: 5.8 % (ref 3.0–12.0)
Neutro Abs: 2.8 10*3/uL (ref 1.4–7.7)
Neutrophils Relative %: 57.3 % (ref 43.0–77.0)
Platelets: 251 10*3/uL (ref 150.0–400.0)
RBC: 4.38 Mil/uL (ref 3.87–5.11)
RDW: 13.6 % (ref 11.5–15.5)
WBC: 4.9 10*3/uL (ref 4.0–10.5)

## 2019-12-10 LAB — COMPREHENSIVE METABOLIC PANEL
ALT: 14 U/L (ref 0–35)
AST: 17 U/L (ref 0–37)
Albumin: 4 g/dL (ref 3.5–5.2)
Alkaline Phosphatase: 71 U/L (ref 39–117)
BUN: 9 mg/dL (ref 6–23)
CO2: 30 mEq/L (ref 19–32)
Calcium: 9.4 mg/dL (ref 8.4–10.5)
Chloride: 106 mEq/L (ref 96–112)
Creatinine, Ser: 0.71 mg/dL (ref 0.40–1.20)
GFR: 99.2 mL/min (ref >60.00)
Glucose, Bld: 95 mg/dL (ref 70–99)
Potassium: 3.9 mEq/L (ref 3.5–5.1)
Sodium: 142 mEq/L (ref 135–145)
Total Bilirubin: 0.8 mg/dL (ref 0.2–1.2)
Total Protein: 7.5 g/dL (ref 6.0–8.3)

## 2019-12-10 LAB — HEMOGLOBIN A1C: Hgb A1c MFr Bld: 5.6 % (ref 4.6–6.5)

## 2019-12-10 LAB — LIPID PANEL
Cholesterol: 173 mg/dL (ref 0–200)
HDL: 44 mg/dL (ref >39.00)
LDL Cholesterol: 115 mg/dL — ABNORMAL HIGH (ref 0–99)
NonHDL: 128.99
Total CHOL/HDL Ratio: 4
Triglycerides: 72 mg/dL (ref 0.0–149.0)
VLDL: 14.4 mg/dL (ref 0.0–40.0)

## 2019-12-10 LAB — TSH: TSH: 1.85 u[IU]/mL (ref 0.35–4.50)

## 2019-12-17 LAB — HEPATITIS C ANTIBODY
Hepatitis C Ab: REACTIVE — AB
SIGNAL TO CUT-OFF: 1.1 — ABNORMAL HIGH (ref <1.00)

## 2019-12-17 LAB — SARS-COV-2 SEMI-QUANTITATIVE TOTAL ANTIBODY, SPIKE: SARS COV2 AB, Total Spike Semi QN: >2500 U/mL — ABNORMAL HIGH (ref <0.8)

## 2019-12-17 LAB — HEPATITIS C RNA QUANTITATIVE
HCV RNA, PCR, QN (Log): <1.18 log IU/mL
HCV RNA, PCR, QN: <15 IU/mL

## 2019-12-20 ENCOUNTER — Encounter: Payer: Self-pay | Admitting: Internal Medicine

## 2019-12-20 ENCOUNTER — Other Ambulatory Visit: Payer: Self-pay

## 2019-12-20 ENCOUNTER — Ambulatory Visit: Payer: Medicare HMO | Admitting: Internal Medicine

## 2019-12-20 DIAGNOSIS — E7800 Pure hypercholesterolemia, unspecified: Secondary | ICD-10-CM

## 2019-12-20 DIAGNOSIS — E559 Vitamin D deficiency, unspecified: Secondary | ICD-10-CM

## 2019-12-20 DIAGNOSIS — M7918 Myalgia, other site: Secondary | ICD-10-CM

## 2019-12-20 DIAGNOSIS — R739 Hyperglycemia, unspecified: Secondary | ICD-10-CM

## 2019-12-20 DIAGNOSIS — F439 Reaction to severe stress, unspecified: Secondary | ICD-10-CM

## 2019-12-20 DIAGNOSIS — H539 Unspecified visual disturbance: Secondary | ICD-10-CM

## 2019-12-20 NOTE — Assessment & Plan Note (Addendum)
The 10-year ASCVD risk score Denman George DC Montez Hageman., et al., 2013) is: 7.5%   Values used to calculate the score:     Age: 69 years     Sex: Female     Is Non-Hispanic African American: Yes     Diabetic: No     Tobacco smoker: No     Systolic Blood Pressure: 122 mmHg     Is BP treated: No     HDL Cholesterol: 44 mg/dL     Total Cholesterol: 173 mg/dL  Discussed calculated cholesterol risk and recommendation to start cholesterol medication.  Wants to continue diet and exercise.  Follow lipid panel.

## 2019-12-20 NOTE — Progress Notes (Signed)
[name], female   DOB: December 18, 1950, 69 y.o.   MRN: [id number]   Subjective:    [name], female    DOB: [birth year], 69 y.o.   MRN: [id number]  HPI This visit occurred during the SARS-CoV-2 public health emergency.  Safety protocols were in place, including screening questions prior to the visit, additional usage of staff PPE, and extensive cleaning of exam room while observing appropriate contact time as indicated for disinfecting solutions.  Patient here for a scheduled follow up. She reports increased pain - right buttock to right knee.  Notices when driving.  Gets out, moves around - better.  Walking and riding her bike - ok.  Increasing problem while driving.  Agreeable to PT.  Tries to stay active.  No chest pain or sob reported.  No abdominal pain.  Bowels moving.  Still dealing with increased stress - husband passed.  Overall doing relatively well.  She had been at computer.  Noticed faint curtain inner right eye.  Did not lose her sight.  Blood pressure ok.  Was evaluated by optometrist.  States everything checked out ok.  No recurrence.  No headache. No other neurological changes.    Past Medical History:  Diagnosis Date  . Complication of anesthesia   . Degenerative disc disease, cervical    cervical  . Hypercholesterolemia   . Meralgia paraesthetica   . Migraines   . Nephrolithiasis   . Neurodermatitis   . Obesity   . Osteopenia   . PONV (postoperative nausea and vomiting)   . Vitamin D deficiency    Past Surgical History:  Procedure Laterality Date  . CHOLECYSTECTOMY N/A 04/24/2015   Procedure: LAPAROSCOPIC CHOLECYSTECTOMY WITH INTRAOPERATIVE CHOLANGIOGRAM;  Surgeon: Nicole Bellow, MD;  Location: [facility] ORS;  Service: General;  Laterality: N/A;  . EXTRACORPOREAL SHOCK WAVE LITHOTRIPSY Right 11/17/2014   Procedure: EXTRACORPOREAL SHOCK WAVE LITHOTRIPSY (ESWL);  Surgeon: Nicole Cowper, MD;  Location: [facility] ORS;  Service: Urology;  Laterality: Right;    . EXTRACORPOREAL SHOCK WAVE LITHOTRIPSY Left 12/15/2014   Procedure: EXTRACORPOREAL SHOCK WAVE LITHOTRIPSY (ESWL);  Surgeon: Nicole Cowper, MD;  Location: [facility] ORS;  Service: Urology;  Laterality: Left;  . laparoscopic surgery  1990   nephrolithiasis (Dr Nicole Hardy)  . LITHOTRIPSY  2010, 1980's  . pessary  2003, 2016   Family History  Problem Relation Age of Onset  . Hypertension Mother   . Hypertension Father   . Asthma Sister   . Hypertension Sister   . Diabetes Sister   . Breast cancer Sister 70  . Rectal cancer Sister    Social History   Socioeconomic History  . Marital status: Married    Spouse name: Not on file  . Number of children: 1  . Years of education: Not on file  . Highest education level: Not on file  Occupational History  . Not on file  Tobacco Use  . Smoking status: Never Smoker  . Smokeless tobacco: Never Used  Vaping Use  . Vaping Use: Never used  Substance and Sexual Activity  . Alcohol use: No    Alcohol/week: 0.0 standard drinks  . Drug use: No  . Sexual activity: Not Currently    Birth control/protection: Surgical  Other Topics Concern  . Not on file  Social History Narrative  . Not on file   Social Determinants of Health   Financial Resource Strain:   . Difficulty of Paying Living Expenses: Not on file  Food Insecurity:   .  Worried About Charity fundraiser in the Last Year: Not on file  . Ran Out of Food in the Last Year: Not on file  Transportation Needs:   . Lack of Transportation (Medical): Not on file  . Lack of Transportation (Non-Medical): Not on file  Physical Activity:   . Days of Exercise per Week: Not on file  . Minutes of Exercise per Session: Not on file  Stress:   . Feeling of Stress : Not on file  Social Connections:   . Frequency of Communication with Friends and Family: Not on file  . Frequency of Social Gatherings with Friends and Family: Not on file  . Attends Religious Services: Not on file  . Active Member of  Clubs or Organizations: Not on file  . Attends Archivist Meetings: Not on file  . Marital Status: Not on file    No outpatient encounter medications on file as of 12/20/2019.   No facility-administered encounter medications on file as of 12/20/2019.   Review of Systems  Constitutional: Negative for appetite change and unexpected weight change.  HENT: Negative for congestion and sinus pressure.   Eyes:       Previous vision change as outlined.    Respiratory: Negative for cough, chest tightness and shortness of breath.   Cardiovascular: Negative for chest pain, palpitations and leg swelling.  Gastrointestinal: Negative for abdominal pain, diarrhea, nausea and vomiting.  Genitourinary: Negative for difficulty urinating and dysuria.  Musculoskeletal: Negative for joint swelling and myalgias.       Right buttock pain and pain right knee.   Skin: Negative for color change and rash.  Neurological: Negative for dizziness, light-headedness and headaches.  Psychiatric/Behavioral: Negative for agitation and dysphoric mood.       Objective:    Physical Exam Vitals reviewed.  Constitutional:      General: She is not in acute distress.    Appearance: Normal appearance.  HENT:     Head: Normocephalic and atraumatic.     Right Ear: External ear normal.     Left Ear: External ear normal.  Eyes:     General: No scleral icterus.       Right eye: No discharge.        Left eye: No discharge.     Conjunctiva/sclera: Conjunctivae normal.  Neck:     Thyroid: No thyromegaly.  Cardiovascular:     Rate and Rhythm: Normal rate and regular rhythm.  Pulmonary:     Effort: No respiratory distress.     Breath sounds: Normal breath sounds. No wheezing.  Abdominal:     General: Bowel sounds are normal.     Palpations: Abdomen is soft.     Tenderness: There is no abdominal tenderness.  Musculoskeletal:        General: No swelling or tenderness.     Cervical back: Neck supple. No  tenderness.  Lymphadenopathy:     Cervical: No cervical adenopathy.  Skin:    Findings: No erythema or rash.  Neurological:     Mental Status: She is alert.  Psychiatric:        Mood and Affect: Mood normal.        Behavior: Behavior normal.     BP 122/82 (BP Location: Left Arm, Patient Position: Sitting)   Pulse 80   Temp 98.2 F (36.8 C)   Ht 5' 5.98" (1.676 m)   Wt 238 lb 9.6 oz (108.2 kg)   LMP 09/10/2016 (Exact Date)   SpO2  96%   BMI 38.53 kg/m  Wt Readings from Last 3 Encounters:  12/20/19 238 lb 9.6 oz (108.2 kg)  08/04/19 217 lb (98.4 kg)  06/14/19 217 lb (98.4 kg)     Lab Results  Component Value Date   WBC 4.9 12/10/2019   HGB 13.3 12/10/2019   HCT 40.5 12/10/2019   PLT 251.0 12/10/2019   GLUCOSE 95 12/10/2019   CHOL 173 12/10/2019   TRIG 72.0 12/10/2019   HDL 44.00 12/10/2019   LDLCALC 115 (H) 12/10/2019   ALT 14 12/10/2019   AST 17 12/10/2019   NA 142 12/10/2019   K 3.9 12/10/2019   CL 106 12/10/2019   CREATININE 0.71 12/10/2019   BUN 9 12/10/2019   CO2 30 12/10/2019   TSH 1.85 12/10/2019   HGBA1C 5.6 12/10/2019    DEXAScan  Result Date: 11/02/2019 EXAM: DUAL X-RAY ABSORPTIOMETRY (DXA) FOR BONE MINERAL DENSITY IMPRESSION: Your patient Katira Dumais completed a BMD test on 11/02/2019 using the [place] (software version: 14.10) manufactured by [employer]. The following summarizes the results of our evaluation. Technologist: SCE PATIENT BIOGRAPHICAL: Name: Shanteria, Laye Patient ID: [id number] Birth Date: 11-22-1950 Height: 64.4 in. Gender: Female Exam Date: 11/02/2019 Weight: 233.8 lbs. Indications: Height Loss, Hysterectomy, Oophorectomy Bilateral, Osteopenia, Parent Hip Fracture, Postmenopausal, Vitamin D Deficiency Fractures: Treatments: Vitamin D DENSITOMETRY RESULTS: Site      Region    Measured Date Measured Age WHO Classification Young Adult T-score BMD         %Change vs. Previous Significant Change (*) AP Spine L1-L4  11/02/2019 67.3 Oste[age] -1.6 1.002 g/cm2 - - DualFemur Neck Left 11/02/2019 67.3 Osteopenia -2.0 0.754 g/cm2 - - ASSESSMENT: The BMD measured at Femur Neck Left is 0.754 g/cm2 with a T-score of -2.0. This patient is considered osteopenic according to [facility]) criteria. World Pharmacologist [facility]) criteria for post-menopausal, Caucasian Women: Normal:                   T-score at or above -1 SD Osteopenia/low bone mass: T-score between -1 and -2.5 SD Osteoporosis:             T-score at or below -2.5 SD RECOMMENDATIONS: 1. All patients should optimize calcium and vitamin D intake. 2. Consider FDA-approved medical therapies in postmenopausal women and men aged 33 years and older, based on the following: a. A hip or vertebral(clinical or morphometric) fracture b. T-score < -2.5 at the femoral neck or spine after appropriate evaluation to exclude secondary causes c. Low bone mass (T-score between -1.0 and -2.5 at the femoral neck or spine) and a 10-year probability of a hip fracture > 3% or a 10-year probability of a major osteoporosis-related fracture > 20% based on the [place]-adapted WHO algorithm 3. Clinician judgment and/or patient preferences may indicate treatment for people with 10-year fracture probabilities above or below these levels FOLLOW-UP: People with diagnosed cases of osteoporosis or at high risk for fracture should have regular bone mineral density tests. For patients eligible for Medicare, routine testing is allowed once every 2 years. The testing frequency can be increased to one year for patients who have rapidly progressing disease, those who are receiving or discontinuing medical therapy to restore bone mass, or have additional risk factors. I have reviewed this report, and agree with the above findings. [facility] Dear Dr Nicki Reaper, Your patient Merrell Rettinger completed a FRAX assessment on 11/02/2019 using the [place] (analysis version: 14.10)  manufactured by [employer]. The following summarizes the results of our evaluation. PATIENT BIOGRAPHICAL: Name: Calle, Schader Patient ID: [id number] Birth Date: 1950/12/24 Height:    64.4 in. Gender:     [age]    [age]       Weight:    233.8 lbs. Ethnicity:  Black                            Exam Date: 11/02/2019 FRAX* RESULTS:  (version: 3.5) 10-year Probability of Fracture1 Major Osteoporotic Fracture2 Hip Fracture 4.6% 0.7% Population: [place] (Black) Risk Factors: None Based on Femur (Left) Neck BMD 1 -The 10-year probability of fracture may be lower than reported if the patient has received treatment. 2 -Major Osteoporotic Fracture: Clinical Spine, Forearm, Hip or Shoulder *FRAX is a Materials engineer of the [employer] for Metabolic Bone Disease, a [place] (WHO) [facility]. ASSESSMENT: The probability of a major osteoporotic fracture is 4.6% within the next ten years. The probability of a hip fracture is 0.7% within the next ten years. . Electronically Signed   By: Rolm Baptise M.D.   On: 11/02/2019 09:40       Assessment & Plan:   Problem List Items Addressed This Visit    Vitamin D deficiency    Follow vitamin D level.       Relevant Orders   VITAMIN D 25 Hydroxy (Vit-D Deficiency, Fractures)   Vision changes    Had the episode - vision change as outlined.  Unclear etiology.  Saw eye doctor.  States everything ok. Obtain records.  No recurrence.  Discussed further w/up, including MRI, carotid ultrasound , etc.  Ok for carotid ultrasound. Wants to hold on head scan.  Follow.        Relevant Orders   VAS US CAROTID   Stress    Increased stress as outlined.  Overall appears to be handling things relatively well. Does not feel needs any further intervention at this time.  Follow.        Right buttock pain    Pain in right buttock and into right knee.  Notices when driving.  Refer to PT.        Relevant Orders   Ambulatory  referral to Physical Therapy   Hyperglycemia    Low carb diet and exercise.  Follow met b and a1c.       Relevant Orders   Hemoglobin A1c   Hypercholesterolemia    The 10-year ASCVD risk score Mikey Bussing DC Jr., et al., 2013) is: 7.5%   Values used to calculate the score:     Age: 57 years     Sex: Female     Is Non-Hispanic African American: Yes     Diabetic: No     Tobacco smoker: No     Systolic Blood Pressure: 518 mmHg     Is BP treated: No     HDL Cholesterol: 44 mg/dL     Total Cholesterol: 173 mg/dL  Discussed calculated cholesterol risk and recommendation to start cholesterol medication.  Wants to continue diet and exercise.  Follow lipid panel.        Relevant Orders   Comprehensive metabolic panel   Lipid panel       Einar Pheasant, MD

## 2019-12-26 ENCOUNTER — Encounter: Payer: Self-pay | Admitting: Internal Medicine

## 2019-12-26 DIAGNOSIS — H539 Unspecified visual disturbance: Secondary | ICD-10-CM | POA: Insufficient documentation

## 2019-12-26 DIAGNOSIS — M7918 Myalgia, other site: Secondary | ICD-10-CM | POA: Insufficient documentation

## 2019-12-26 NOTE — Assessment & Plan Note (Signed)
Increased stress as outlined.  Overall appears to be handling things relatively well. Does not feel needs any further intervention at this time.  Follow.

## 2019-12-26 NOTE — Assessment & Plan Note (Signed)
Follow vitamin D level.  

## 2019-12-26 NOTE — Assessment & Plan Note (Signed)
Had the episode - vision change as outlined.  Unclear etiology.  Saw eye doctor.  States everything ok. Obtain records.  No recurrence.  Discussed further w/up, including MRI, carotid ultrasound , etc.  Ok for carotid ultrasound. Wants to hold on head scan.  Follow.

## 2019-12-26 NOTE — Assessment & Plan Note (Signed)
Low carb diet and exercise.  Follow met b and a1c.

## 2019-12-26 NOTE — Assessment & Plan Note (Signed)
Pain in right buttock and into right knee.  Notices when driving.  Refer to PT.

## 2020-02-09 ENCOUNTER — Telehealth: Payer: Self-pay | Admitting: Internal Medicine

## 2020-02-09 NOTE — Telephone Encounter (Signed)
Patient has a referral to [facility]. Patient is going to have a ultrasound and while she is there she would like to have a ultra sound on her knee and hip. Patient would need a referral for that also.

## 2020-02-10 ENCOUNTER — Other Ambulatory Visit: Payer: Self-pay

## 2020-02-10 ENCOUNTER — Ambulatory Visit (INDEPENDENT_AMBULATORY_CARE_PROVIDER_SITE_OTHER): Payer: Medicare HMO

## 2020-02-10 DIAGNOSIS — H539 Unspecified visual disturbance: Secondary | ICD-10-CM | POA: Diagnosis not present

## 2020-02-10 NOTE — Telephone Encounter (Signed)
I do not mind ordering xray, but I like to evaluate prior to xray to confirm xraying correct area.  (for example, sometimes hip/leg pain - can be originating from back).  If agreeable, can evaluate at appt and obtain xray while here.  Let me know if a problem.

## 2020-02-10 NOTE — Telephone Encounter (Signed)
Patient has been having ongoing right hip and knee pain. She has done PT in the past. Requesting to have x-rays done when she comes in for labs on 12/15 so she can discuss at her appt with PCP on 12/17.

## 2020-02-16 NOTE — Telephone Encounter (Signed)
Patient is aware 

## 2020-03-22 ENCOUNTER — Other Ambulatory Visit (INDEPENDENT_AMBULATORY_CARE_PROVIDER_SITE_OTHER): Payer: Medicare HMO

## 2020-03-22 ENCOUNTER — Other Ambulatory Visit: Payer: Self-pay

## 2020-03-22 DIAGNOSIS — R739 Hyperglycemia, unspecified: Secondary | ICD-10-CM | POA: Diagnosis not present

## 2020-03-22 DIAGNOSIS — E559 Vitamin D deficiency, unspecified: Secondary | ICD-10-CM

## 2020-03-22 DIAGNOSIS — E7800 Pure hypercholesterolemia, unspecified: Secondary | ICD-10-CM

## 2020-03-22 LAB — COMPREHENSIVE METABOLIC PANEL
ALT: 13 U/L (ref 0–35)
AST: 14 U/L (ref 0–37)
Albumin: 3.8 g/dL (ref 3.5–5.2)
Alkaline Phosphatase: 61 U/L (ref 39–117)
BUN: 16 mg/dL (ref 6–23)
CO2: 29 mEq/L (ref 19–32)
Calcium: 9 mg/dL (ref 8.4–10.5)
Chloride: 106 mEq/L (ref 96–112)
Creatinine, Ser: 0.74 mg/dL (ref 0.40–1.20)
GFR: 83.5 mL/min (ref >60.00)
Glucose, Bld: 93 mg/dL (ref 70–99)
Potassium: 3.9 mEq/L (ref 3.5–5.1)
Sodium: 142 mEq/L (ref 135–145)
Total Bilirubin: 0.7 mg/dL (ref 0.2–1.2)
Total Protein: 7 g/dL (ref 6.0–8.3)

## 2020-03-22 LAB — LIPID PANEL
Cholesterol: 178 mg/dL (ref 0–200)
HDL: 42.7 mg/dL (ref >39.00)
LDL Cholesterol: 122 mg/dL — ABNORMAL HIGH (ref 0–99)
NonHDL: 135.51
Total CHOL/HDL Ratio: 4
Triglycerides: 69 mg/dL (ref 0.0–149.0)
VLDL: 13.8 mg/dL (ref 0.0–40.0)

## 2020-03-22 LAB — VITAMIN D 25 HYDROXY (VIT D DEFICIENCY, FRACTURES): VITD: 22.33 ng/mL — ABNORMAL LOW (ref 30.00–100.00)

## 2020-03-22 LAB — HEMOGLOBIN A1C: Hgb A1c MFr Bld: 5.7 % (ref 4.6–6.5)

## 2020-03-23 ENCOUNTER — Encounter: Payer: Self-pay | Admitting: Internal Medicine

## 2020-03-24 ENCOUNTER — Ambulatory Visit: Payer: Medicare HMO | Admitting: Internal Medicine

## 2020-03-24 ENCOUNTER — Other Ambulatory Visit: Payer: Self-pay

## 2020-03-24 ENCOUNTER — Encounter: Payer: Self-pay | Admitting: Internal Medicine

## 2020-03-24 DIAGNOSIS — H539 Unspecified visual disturbance: Secondary | ICD-10-CM | POA: Diagnosis not present

## 2020-03-24 DIAGNOSIS — E559 Vitamin D deficiency, unspecified: Secondary | ICD-10-CM

## 2020-03-24 DIAGNOSIS — M7918 Myalgia, other site: Secondary | ICD-10-CM

## 2020-03-24 DIAGNOSIS — F439 Reaction to severe stress, unspecified: Secondary | ICD-10-CM

## 2020-03-24 DIAGNOSIS — E7800 Pure hypercholesterolemia, unspecified: Secondary | ICD-10-CM

## 2020-03-24 DIAGNOSIS — R519 Headache, unspecified: Secondary | ICD-10-CM

## 2020-03-24 DIAGNOSIS — R739 Hyperglycemia, unspecified: Secondary | ICD-10-CM

## 2020-03-24 LAB — CBC WITH DIFFERENTIAL/PLATELET
Basophils Absolute: 0 10*3/uL (ref 0.0–0.1)
Basophils Relative: 0.7 % (ref 0.0–3.0)
Eosinophils Absolute: 0.1 10*3/uL (ref 0.0–0.7)
Eosinophils Relative: 1.3 % (ref 0.0–5.0)
HCT: 38.7 % (ref 36.0–46.0)
Hemoglobin: 12.7 g/dL (ref 12.0–15.0)
Lymphocytes Relative: 36.1 % (ref 12.0–46.0)
Lymphs Abs: 1.9 10*3/uL (ref 0.7–4.0)
MCHC: 32.8 g/dL (ref 30.0–36.0)
MCV: 91.7 fl (ref 78.0–100.0)
Monocytes Absolute: 0.3 10*3/uL (ref 0.1–1.0)
Monocytes Relative: 5.5 % (ref 3.0–12.0)
Neutro Abs: 3 10*3/uL (ref 1.4–7.7)
Neutrophils Relative %: 56.4 % (ref 43.0–77.0)
Platelets: 259 10*3/uL (ref 150.0–400.0)
RBC: 4.22 Mil/uL (ref 3.87–5.11)
RDW: 13.5 % (ref 11.5–15.5)
WBC: 5.4 10*3/uL (ref 4.0–10.5)

## 2020-03-24 LAB — SEDIMENTATION RATE: Sed Rate: 39 mm/hr — ABNORMAL HIGH (ref 0–30)

## 2020-03-24 NOTE — Assessment & Plan Note (Addendum)
The 10-year ASCVD risk score Denman George DC Montez Hageman., et al., 2013) is: 7.8%   Values used to calculate the score:     Age: 69 years     Sex: Female     Is Non-Hispanic African American: Yes     Diabetic: No     Tobacco smoker: No     Systolic Blood Pressure: 122 mmHg     Is BP treated: No     HDL Cholesterol: 42.7 mg/dL     Total Cholesterol: 178 mg/dL  Discussed starting a cholesterol medication.  She wants to hold on starting at this time.  Follow lipid panel.  Low cholesterol diet and exercise.

## 2020-03-24 NOTE — Progress Notes (Addendum)
[name], female   DOB: [birth year], 69 y.o.   MRN: [id number]   Subjective:    [name], female    DOB: May 04, 1950, 69 y.o.   MRN: [id number]  HPI This visit occurred during the SARS-CoV-2 public health emergency.  Safety protocols were in place, including screening questions prior to the visit, additional usage of staff PPE, and extensive cleaning of exam room while observing appropriate contact time as indicated for disinfecting solutions.  Patient here for a scheduled follow up.  Last visit had reported increased pain - right buttock to right knee. Went to therapy.  Better.  Mostly notices whe sitting driving or lying in bed.  She is concerned. Discussed arthritis and bursitis.  She desires ortho referral.  Reports previously noticing a pain - right temple.  Intermittent.  Resolved now.  No headache or head pain now.  No rash.  Right ear better.  Previously noticed black coming over her eye.  See last note.  Saw optometry.  No abnormality found.  No recurrence.  Also reports some pain in her left ankle. No pain with rotation of her foot/ankle.  Discussed compression hose.  Previous shoulder pain - resolved.  Not taking vitamin D.  Plans to restart. Discussed her labs.  No chest pain or sob.  No nausea or vomiting.  No abdominal pain.  Bowels moving.   Past Medical History:  Diagnosis Date  . Complication of anesthesia   . Degenerative disc disease, cervical    cervical  . Hypercholesterolemia   . Meralgia paraesthetica   . Migraines   . Nephrolithiasis   . Neurodermatitis   . Obesity   . Osteopenia   . PONV (postoperative nausea and vomiting)   . Vitamin D deficiency    Past Surgical History:  Procedure Laterality Date  . CHOLECYSTECTOMY N/A 04/24/2015   Procedure: LAPAROSCOPIC CHOLECYSTECTOMY WITH INTRAOPERATIVE CHOLANGIOGRAM;  Surgeon: Robert Bellow, MD;  Location: [facility] OR[facility];  Service: General;  Laterality: N/A;  . EXTRACORPOREAL SHOCK WAVE LITHOTRIPSY  Right 11/17/2014   Procedure: EXTRACORPOREAL SHOCK WAVE LITHOTRIPSY (ESWL);  Surgeon: Royston Cowper, MD;  Location: [facility] OR[facility];  Service: Urology;  Laterality: Right;  . EXTRACORPOREAL SHOCK WAVE LITHOTRIPSY Left 12/15/2014   Procedure: EXTRACORPOREAL SHOCK WAVE LITHOTRIPSY (ESWL);  Surgeon: Royston Cowper, MD;  Location: [facility] OR[facility];  Service: Urology;  Laterality: Left;  . laparoscopic surgery  1990   nephrolithiasis (Dr Ernst Spell)  . LITHOTRIPSY  2010, 1980's  . pessary  2003, 2016   Family History  Problem Relation Age of Onset  . Hypertension Mother   . Hypertension Father   . Asthma Sister   . Hypertension Sister   . Diabetes Sister   . Breast cancer Sister 51  . Rectal cancer Sister    Social History   Socioeconomic History  . Marital status: Married    Spouse name: Not on file  . Number of children: 1  . Years of education: Not on file  . Highest education level: Not on file  Occupational History  . Not on file  Tobacco Use  . Smoking status: Never Smoker  . Smokeless tobacco: Never Used  Vaping Use  . Vaping Use: Never used  Substance and Sexual Activity  . Alcohol use: No    Alcohol/week: 0.0 standard drinks  . Drug use: No  . Sexual activity: Not Currently    Birth control/protection: Surgical  Other Topics Concern  . Not on file  Social History Narrative  .  Not on file   Social Determinants of Health   Financial Resource Strain: Not on file  Food Insecurity: Not on file  Transportation Needs: Not on file  Physical Activity: Not on file  Stress: Not on file  Social Connections: Not on file    No outpatient encounter medications on file as of 03/24/2020.   No facility-administered encounter medications on file as of 03/24/2020.    Review of Systems  Constitutional: Negative for appetite change and unexpected weight change.  HENT: Negative for congestion and sinus pressure.   Respiratory: Negative for cough, chest tightness and shortness of breath.    Cardiovascular: Negative for chest pain, palpitations and leg swelling.  Gastrointestinal: Negative for abdominal pain, diarrhea, nausea and vomiting.  Genitourinary: Negative for difficulty urinating and dysuria.  Musculoskeletal: Negative for myalgias.       Right hip/leg/knee pain as outlined.    Skin: Negative for color change and rash.  Neurological: Negative for dizziness, light-headedness and headaches.  Psychiatric/Behavioral: Negative for agitation and dysphoric mood.       Objective:    Physical Exam Vitals reviewed.  Constitutional:      General: She is not in acute distress.    Appearance: Normal appearance.  HENT:     Head: Normocephalic and atraumatic.     Right Ear: External ear normal.     Left Ear: External ear normal.     Mouth/Throat:     Mouth: Oropharynx is clear and moist.  Eyes:     General: No scleral icterus.       Right eye: No discharge.        Left eye: No discharge.     Conjunctiva/sclera: Conjunctivae normal.  Neck:     Thyroid: No thyromegaly.  Cardiovascular:     Rate and Rhythm: Normal rate and regular rhythm.  Pulmonary:     Effort: No respiratory distress.     Breath sounds: Normal breath sounds. No wheezing.  Abdominal:     General: Bowel sounds are normal.     Palpations: Abdomen is soft.     Tenderness: There is no abdominal tenderness.  Musculoskeletal:        General: No tenderness or edema.     Cervical back: Neck supple. No tenderness.     Comments: No pain with abduction/adduction - lower extremity.  Motor strength - equal bilateral lower extremity.  Negative SLR.   Lymphadenopathy:     Cervical: No cervical adenopathy.  Skin:    Findings: No erythema or rash.  Neurological:     Mental Status: She is alert.  Psychiatric:        Mood and Affect: Mood normal.        Behavior: Behavior normal.     BP 122/72   Pulse 77   Temp 98.1 F (36.7 C) (Oral)   Resp 16   Ht $R'5\' 6"'gA$  (1.676 m)   Wt 242 lb (109.8 kg)   LMP  09/10/2016 (Exact Date)   SpO2 98%   BMI 39.06 kg/m  Wt Readings from Last 3 Encounters:  03/24/20 242 lb (109.8 kg)  12/20/19 238 lb 9.6 oz (108.2 kg)  08/04/19 217 lb (98.4 kg)     Lab Results  Component Value Date   WBC 5.4 03/24/2020   HGB 12.7 03/24/2020   HCT 38.7 03/24/2020   PLT 259.0 03/24/2020   GLUCOSE 93 03/22/2020   CHOL 178 03/22/2020   TRIG 69.0 03/22/2020   HDL 42.70 03/22/2020   [place]  122 (H) 03/22/2020   ALT 13 03/22/2020   AST 14 03/22/2020   NA 142 03/22/2020   K 3.9 03/22/2020   CL 106 03/22/2020   CREATININE 0.74 03/22/2020   BUN 16 03/22/2020   CO2 29 03/22/2020   TSH 1.85 12/10/2019   HGBA1C 5.7 03/22/2020    DEXAScan  Result Date: 11/02/2019 EXAM: DUAL X-RAY ABSORPTIOMETRY (DXA) FOR BONE MINERAL DENSITY IMPRESSION: Your patient Shanquita Ronning completed a BMD test on 11/02/2019 using the [place] (software version: 14.10) manufactured by [employer]. The following summarizes the results of our evaluation. Technologist: SCE PATIENT BIOGRAPHICAL: Name: Maxi, Carreras Patient ID: [id number] Birth Date: June 02, 1950 Height: 64.4 in. Gender: Female Exam Date: 11/02/2019 Weight: 233.8 lbs. Indications: Height Loss, Hysterectomy, Oophorectomy Bilateral, Osteopenia, Parent Hip Fracture, Postmenopausal, Vitamin D Deficiency Fractures: Treatments: Vitamin D DENSITOMETRY RESULTS: Site      Region    Measured Date Measured Age WHO Classification Young Adult T-score BMD         %Change vs. Previous Significant Change (*) AP Spine L1-L4 11/02/2019 67.3 Osteopenia -1.6 1.002 g/cm2 - - DualFemur Neck Left 11/02/2019 67.3 Osteopenia -2.0 0.754 g/cm2 - - ASSESSMENT: The BMD measured at Femur Neck Left is 0.754 g/cm2 with a T-score of -2.0. This patient is considered osteopenic according to [facility]) criteria. World Pharmacologist [facility]) criteria for post-menopausal, Caucasian Women: Normal:                   T-score at or above  -1 SD Osteopenia/low bone mass: T-score between -1 and -2.5 SD Osteoporosis:             T-score at or below -2.5 SD RECOMMENDATIONS: 1. All patients should optimize calcium and vitamin D intake. 2. Consider FDA-approved medical therapies in postmenopausal women and men aged 34 years and older, based on the following: a. A hip or vertebral(clinical or morphometric) fracture b. T-score < -2.5 at the femoral neck or spine after appropriate evaluation to exclude secondary causes c. Low bone mass (T-score between -1.0 and -2.5 at the femoral neck or spine) and a 10-year probability of a hip fracture > 3% or a 10-year probability of a major osteoporosis-related fracture > 20% based on the [place]-adapted WHO algorithm 3. Clinician judgment and/or patient preferences may indicate treatment for people with 10-year fracture probabilities above or below these levels FOLLOW-UP: People with diagnosed cases of osteoporosis or at high risk for fracture should have regular bone mineral density tests. For patients eligible for Medicare, routine testing is allowed once every 2 years. The testing frequency can be increased to one year for patients who have rapidly progressing disease, those who are receiving or discontinuing medical therapy to restore bone mass, or have additional risk factors. I have reviewed this report, and agree with the above findings. [facility] Dear Dr Nicki Reaper, Your patient Yadira Hada completed a FRAX assessment on 11/02/2019 using the [place] (analysis version: 14.10) manufactured by [employer]. The following summarizes the results of our evaluation. PATIENT BIOGRAPHICAL: Name: Hajer, Dwyer Patient ID: [id number] Birth Date: 07/23/1950 Height:    64.4 in. Gender:     Female    Age:        62.3       Weight:    233.8 lbs. Ethnicity:  Black  Exam Date: 11/02/2019 FRAX* RESULTS:  (version: 3.5) 10-year Probability of Fracture1 Major Osteoporotic Fracture2 Hip  Fracture 4.6% 0.7% Population: [place] (Black) Risk Factors: None Based on Femur (Left) Neck BMD 1 -The 10-year probability of fracture may be lower than reported if the patient has received treatment. 2 -Major Osteoporotic Fracture: Clinical Spine, Forearm, Hip or Shoulder *FRAX is a Materials engineer of the [employer] for Metabolic Bone Disease, a [place] (WHO) [facility]. ASSESSMENT: The probability of a major osteoporotic fracture is 4.6% within the next ten years. The probability of a hip fracture is 0.7% within the next ten years. . Electronically Signed   By: Rolm Baptise M.D.   On: 11/02/2019 09:40       Assessment & Plan:   Problem List Items Addressed This Visit    Hypercholesterolemia    The 10-year ASCVD risk score Mikey Bussing DC Jr., et al., 2013) is: 7.8%   Values used to calculate the score:     Age: 71 years     Sex: Female     Is Non-Hispanic African American: Yes     Diabetic: No     Tobacco smoker: No     Systolic Blood Pressure: 053 mmHg     Is BP treated: No     HDL Cholesterol: 42.7 mg/dL     Total Cholesterol: 178 mg/dL  Discussed starting a cholesterol medication.  She wants to hold on starting at this time.  Follow lipid panel.  Low cholesterol diet and exercise.        Vitamin D deficiency    Has stopped taking her vitamin D supplement.  Will restart.        Stress    Increased stress as outlined.  Stable.  Follow.        Hyperglycemia    Low carb diet and exercise.  Follow met b and a1c.   Lab Results  Component Value Date   HGBA1C 5.7 03/22/2020        Vision changes    Saw optometry.  No further episodes like that.  Does report a change in her vision - driving at night, etc. She will f/u with eye MD.        Right buttock pain    Right buttock, right hip and right knee pain as outlined.  Could not reproduce on exam.  Stretches.  Went to therapy.  F/u with ortho.        Relevant Orders    Ambulatory referral to Orthopedic Surgery   Head pain    No pain currently.  Described the pain in her temple.  Check esr.        Relevant Orders   CBC with Differential/Platelet (Completed)   Sedimentation rate (Completed)       Einar Pheasant, MD

## 2020-03-25 ENCOUNTER — Encounter: Payer: Self-pay | Admitting: Internal Medicine

## 2020-03-25 NOTE — Assessment & Plan Note (Signed)
Increased stress as outlined.  Stable.  Follow.

## 2020-03-25 NOTE — Assessment & Plan Note (Signed)
Saw optometry.  No further episodes like that.  Does report a change in her vision - driving at night, etc. She will f/u with eye MD.

## 2020-03-25 NOTE — Assessment & Plan Note (Signed)
Low carb diet and exercise.  Follow met b and a1c.   Lab Results  Component Value Date   HGBA1C 5.7 03/22/2020

## 2020-03-25 NOTE — Assessment & Plan Note (Signed)
Right buttock, right hip and right knee pain as outlined.  Could not reproduce on exam.  Stretches.  Went to therapy.  F/u with ortho.

## 2020-03-25 NOTE — Assessment & Plan Note (Signed)
No pain currently.  Described the pain in her temple.  Check esr.

## 2020-03-25 NOTE — Assessment & Plan Note (Signed)
Has stopped taking her vitamin D supplement.  Will restart.

## 2020-03-26 NOTE — Addendum Note (Signed)
Addended by: Charm Barges on: 03/26/2020 07:38 AM   Modules accepted: Orders

## 2020-03-28 ENCOUNTER — Telehealth: Payer: Self-pay | Admitting: Internal Medicine

## 2020-03-28 NOTE — Telephone Encounter (Signed)
Her hemoglobin is ok, but given her current symptoms she is having, would recommend her hold off on donating blood at this time.

## 2020-03-28 NOTE — Telephone Encounter (Signed)
Pt wanted a call back to discuss her lab work more.  She wanted to know if her labs were good enough for her to donate blood

## 2020-03-28 NOTE — Telephone Encounter (Signed)
Patient is aware 

## 2020-04-04 ENCOUNTER — Telehealth: Payer: Self-pay | Admitting: Internal Medicine

## 2020-04-04 NOTE — Telephone Encounter (Signed)
Booster is ok stay with the same vaccine Call pt Thanks

## 2020-04-04 NOTE — Telephone Encounter (Signed)
Patient has been notified

## 2020-04-04 NOTE — Telephone Encounter (Signed)
Patient called in wanted to know if it was ok for her to get her booster shot after the result of her labs

## 2020-05-15 ENCOUNTER — Other Ambulatory Visit: Payer: Self-pay

## 2020-05-22 ENCOUNTER — Other Ambulatory Visit: Payer: Medicare HMO

## 2020-05-23 ENCOUNTER — Telehealth: Payer: Self-pay | Admitting: Internal Medicine

## 2020-05-23 ENCOUNTER — Other Ambulatory Visit: Payer: Self-pay | Admitting: Internal Medicine

## 2020-05-23 DIAGNOSIS — Z1231 Encounter for screening mammogram for malignant neoplasm of breast: Secondary | ICD-10-CM

## 2020-05-23 DIAGNOSIS — R739 Hyperglycemia, unspecified: Secondary | ICD-10-CM

## 2020-05-23 DIAGNOSIS — E7800 Pure hypercholesterolemia, unspecified: Secondary | ICD-10-CM

## 2020-05-23 NOTE — Telephone Encounter (Signed)
Patient wanted to get labs before her physical 07-17-20 need order

## 2020-05-24 NOTE — Telephone Encounter (Signed)
Labs ordered. Please schedule her for a fasting lab appt

## 2020-05-24 NOTE — Telephone Encounter (Signed)
Called and scheduled pt

## 2020-06-20 ENCOUNTER — Other Ambulatory Visit: Payer: Self-pay

## 2020-06-20 ENCOUNTER — Ambulatory Visit
Admission: RE | Admit: 2020-06-20 | Discharge: 2020-06-20 | Disposition: A | Discharge status: Home / Self Care | Payer: Medicare HMO | Source: Ambulatory Visit | Attending: Internal Medicine | Admitting: Internal Medicine

## 2020-06-20 DIAGNOSIS — Z1231 Encounter for screening mammogram for malignant neoplasm of breast: Secondary | ICD-10-CM | POA: Insufficient documentation

## 2020-07-13 ENCOUNTER — Other Ambulatory Visit: Payer: Self-pay

## 2020-07-13 ENCOUNTER — Other Ambulatory Visit (INDEPENDENT_AMBULATORY_CARE_PROVIDER_SITE_OTHER): Payer: Medicare HMO

## 2020-07-13 DIAGNOSIS — E7800 Pure hypercholesterolemia, unspecified: Secondary | ICD-10-CM | POA: Diagnosis not present

## 2020-07-13 DIAGNOSIS — R739 Hyperglycemia, unspecified: Secondary | ICD-10-CM

## 2020-07-13 LAB — TSH: TSH: 2.42 u[IU]/mL (ref 0.35–4.50)

## 2020-07-13 LAB — CBC WITH DIFFERENTIAL/PLATELET
Basophils Absolute: 0 10*3/uL (ref 0.0–0.1)
Basophils Relative: 0.6 % (ref 0.0–3.0)
Eosinophils Absolute: 0.1 10*3/uL (ref 0.0–0.7)
Eosinophils Relative: 1.6 % (ref 0.0–5.0)
HCT: 39.4 % (ref 36.0–46.0)
Hemoglobin: 12.9 g/dL (ref 12.0–15.0)
Lymphocytes Relative: 34.2 % (ref 12.0–46.0)
Lymphs Abs: 1.6 10*3/uL (ref 0.7–4.0)
MCHC: 32.8 g/dL (ref 30.0–36.0)
MCV: 91.1 fl (ref 78.0–100.0)
Monocytes Absolute: 0.3 10*3/uL (ref 0.1–1.0)
Monocytes Relative: 6.8 % (ref 3.0–12.0)
Neutro Abs: 2.7 10*3/uL (ref 1.4–7.7)
Neutrophils Relative %: 56.8 % (ref 43.0–77.0)
Platelets: 267 10*3/uL (ref 150.0–400.0)
RBC: 4.33 Mil/uL (ref 3.87–5.11)
RDW: 13.7 % (ref 11.5–15.5)
WBC: 4.7 10*3/uL (ref 4.0–10.5)

## 2020-07-13 LAB — LIPID PANEL
Cholesterol: 194 mg/dL (ref 0–200)
HDL: 42.5 mg/dL (ref >39.00)
LDL Cholesterol: 135 mg/dL — ABNORMAL HIGH (ref 0–99)
NonHDL: 151.2
Total CHOL/HDL Ratio: 5
Triglycerides: 79 mg/dL (ref 0.0–149.0)
VLDL: 15.8 mg/dL (ref 0.0–40.0)

## 2020-07-13 LAB — HEPATIC FUNCTION PANEL
ALT: 17 U/L (ref 0–35)
AST: 17 U/L (ref 0–37)
Albumin: 3.9 g/dL (ref 3.5–5.2)
Alkaline Phosphatase: 72 U/L (ref 39–117)
Bilirubin, Direct: 0.1 mg/dL (ref 0.0–0.3)
Total Bilirubin: 0.7 mg/dL (ref 0.2–1.2)
Total Protein: 7.1 g/dL (ref 6.0–8.3)

## 2020-07-13 LAB — BASIC METABOLIC PANEL
BUN: 15 mg/dL (ref 6–23)
CO2: 31 mEq/L (ref 19–32)
Calcium: 9.4 mg/dL (ref 8.4–10.5)
Chloride: 106 mEq/L (ref 96–112)
Creatinine, Ser: 0.82 mg/dL (ref 0.40–1.20)
GFR: 73.67 mL/min (ref >60.00)
Glucose, Bld: 101 mg/dL — ABNORMAL HIGH (ref 70–99)
Potassium: 4.2 mEq/L (ref 3.5–5.1)
Sodium: 142 mEq/L (ref 135–145)

## 2020-07-13 LAB — HEMOGLOBIN A1C: Hgb A1c MFr Bld: 5.8 % (ref 4.6–6.5)

## 2020-07-17 ENCOUNTER — Other Ambulatory Visit: Payer: Self-pay

## 2020-07-17 ENCOUNTER — Ambulatory Visit (INDEPENDENT_AMBULATORY_CARE_PROVIDER_SITE_OTHER): Payer: Medicare HMO

## 2020-07-17 ENCOUNTER — Encounter: Payer: Self-pay | Admitting: Internal Medicine

## 2020-07-17 ENCOUNTER — Ambulatory Visit (INDEPENDENT_AMBULATORY_CARE_PROVIDER_SITE_OTHER): Payer: Medicare HMO | Admitting: Internal Medicine

## 2020-07-17 VITALS — BP 130/80 | HR 78 | Temp 98.0°F | Resp 16 | Ht 66.0 in | Wt 252.0 lb

## 2020-07-17 DIAGNOSIS — R739 Hyperglycemia, unspecified: Secondary | ICD-10-CM

## 2020-07-17 DIAGNOSIS — E7800 Pure hypercholesterolemia, unspecified: Secondary | ICD-10-CM

## 2020-07-17 DIAGNOSIS — F439 Reaction to severe stress, unspecified: Secondary | ICD-10-CM | POA: Diagnosis not present

## 2020-07-17 DIAGNOSIS — M546 Pain in thoracic spine: Secondary | ICD-10-CM

## 2020-07-17 DIAGNOSIS — M549 Dorsalgia, unspecified: Secondary | ICD-10-CM | POA: Insufficient documentation

## 2020-07-17 DIAGNOSIS — Z0000 Encounter for general adult medical examination without abnormal findings: Secondary | ICD-10-CM

## 2020-07-17 MED ORDER — ROSUVASTATIN CALCIUM 5 MG PO TABS: ORAL_TABLET | ORAL | 1 refills | Status: DC

## 2020-07-17 NOTE — Assessment & Plan Note (Addendum)
The 10-year ASCVD risk score Denman George DC Montez Hageman., et al., 2013) is: 10.2%   Values used to calculate the score:     Age: 70 years     Sex: Female     Is Non-Hispanic African American: Yes     Diabetic: No     Tobacco smoker: No     Systolic Blood Pressure: 130 mmHg     Is BP treated: No     HDL Cholesterol: 42.5 mg/dL     Total Cholesterol: 194 mg/dL  Discussed starting crestor.  Low cholesterol diet and exercise.  Follow lipid panel and liver function tests.

## 2020-07-17 NOTE — Progress Notes (Signed)
[name], female   DOB: [birth year], 70 y.o.   MRN: [id number]   Subjective:    [name], female    DOB: 11-Feb-1951, 70 y.o.   MRN: [id number]  HPI This visit occurred during the SARS-CoV-2 public health emergency.  Safety protocols were in place, including screening questions prior to the visit, additional usage of staff PPE, and extensive cleaning of exam room while observing appropriate contact time as indicated for disinfecting solutions.  Patient here for her physical exam.  She reports she is doing relatively well.  Has started exercising - 2 days per week.  No chest pain or sob reported.  No abdominal pain.  No increased cough.  Sleeping ok.  Bowels ok - miralax keeps bowels regular.  Knee/hip - better.  Handling stress.  Increased back - upper back pain.  No pain with deep breathing.    Past Medical History:  Diagnosis Date  . Complication of anesthesia   . Degenerative disc disease, cervical    cervical  . Hypercholesterolemia   . Meralgia paraesthetica   . Migraines   . Nephrolithiasis   . Neurodermatitis   . Obesity   . Osteopenia   . PONV (postoperative nausea and vomiting)   . Vitamin D deficiency    Past Surgical History:  Procedure Laterality Date  . CHOLECYSTECTOMY N/A 04/24/2015   Procedure: LAPAROSCOPIC CHOLECYSTECTOMY WITH INTRAOPERATIVE CHOLANGIOGRAM;  Surgeon: Robert Bellow, MD;  Location: [facility];  Service: General;  Laterality: N/A;  . EXTRACORPOREAL SHOCK WAVE LITHOTRIPSY Right 11/17/2014   Procedure: EXTRACORPOREAL SHOCK WAVE LITHOTRIPSY (ESWL);  Surgeon: Royston Cowper, MD;  Location: [facility];  Service: Urology;  Laterality: Right;  . EXTRACORPOREAL SHOCK WAVE LITHOTRIPSY Left 12/15/2014   Procedure: EXTRACORPOREAL SHOCK WAVE LITHOTRIPSY (ESWL);  Surgeon: Royston Cowper, MD;  Location: [facility];  Service: Urology;  Laterality: Left;  . laparoscopic surgery  1990   nephrolithiasis (Dr Ernst Spell)  . LITHOTRIPSY  2010, 1980's  .  pessary  2003, 2016   Family History  Problem Relation Age of Onset  . Hypertension Mother   . Hypertension Father   . Asthma Sister   . Hypertension Sister   . Diabetes Sister   . Breast cancer Sister 71  . Rectal cancer Sister    Social History   Socioeconomic History  . Marital status: Married    Spouse name: Not on file  . Number of children: 1  . Years of education: Not on file  . Highest education level: Not on file  Occupational History  . Not on file  Tobacco Use  . Smoking status: Never Smoker  . Smokeless tobacco: Never Used  Vaping Use  . Vaping Use: Never used  Substance and Sexual Activity  . Alcohol use: No    Alcohol/week: 0.0 standard drinks  . Drug use: No  . Sexual activity: Not Currently    Birth control/protection: Surgical  Other Topics Concern  . Not on file  Social History Narrative  . Not on file   Social Determinants of Health   Financial Resource Strain: Not on file  Food Insecurity: Not on file  Transportation Needs: Not on file  Physical Activity: Not on file  Stress: Not on file  Social Connections: Not on file    Outpatient Encounter Medications as of 07/17/2020  Medication Sig  . rosuvastatin (CRESTOR) 5 MG tablet Take 1 tablet 2x/week.   No facility-administered encounter medications on file as of 07/17/2020.   Review  of Systems  Constitutional: Negative for appetite change and unexpected weight change.  HENT: Negative for congestion, sinus pressure and sore throat.   Eyes: Negative for pain and visual disturbance.  Respiratory: Negative for cough, chest tightness and shortness of breath.   Cardiovascular: Negative for chest pain, palpitations and leg swelling.  Gastrointestinal: Negative for abdominal pain, diarrhea, nausea and vomiting.  Genitourinary: Negative for difficulty urinating and dysuria.  Musculoskeletal: Negative for joint swelling and myalgias.  Skin: Negative for color change and rash.  Neurological:  Negative for dizziness, light-headedness and headaches.  Hematological: Negative for adenopathy. Does not bruise/bleed easily.  Psychiatric/Behavioral: Negative for agitation and dysphoric mood.       Objective:    Physical Exam Vitals reviewed.  Constitutional:      General: She is not in acute distress.    Appearance: Normal appearance. She is well-developed.  HENT:     Head: Normocephalic and atraumatic.     Right Ear: External ear normal.     Left Ear: External ear normal.  Eyes:     General: No scleral icterus.       Right eye: No discharge.        Left eye: No discharge.     Conjunctiva/sclera: Conjunctivae normal.  Neck:     Thyroid: No thyromegaly.  Cardiovascular:     Rate and Rhythm: Normal rate and regular rhythm.  Pulmonary:     Effort: No tachypnea, accessory muscle usage or respiratory distress.     Breath sounds: Normal breath sounds. No decreased breath sounds or wheezing.  Chest:  Breasts:     Right: No inverted nipple, mass, nipple discharge or tenderness (no axillary adenopathy).     Left: No inverted nipple, mass, nipple discharge or tenderness (no axilarry adenopathy).    Abdominal:     General: Bowel sounds are normal.     Palpations: Abdomen is soft.     Tenderness: There is no abdominal tenderness.  Musculoskeletal:        General: No swelling or tenderness.     Cervical back: Neck supple. No tenderness.  Lymphadenopathy:     Cervical: No cervical adenopathy.  Skin:    Findings: No erythema or rash.  Neurological:     Mental Status: She is alert and oriented to person, place, and time.  Psychiatric:        Mood and Affect: Mood normal.        Behavior: Behavior normal.     BP 130/80   Pulse 78   Temp 98 F (36.7 C) (Oral)   Resp 16   Ht $R'5\' 6"'qW$  (1.676 m)   Wt 252 lb (114.3 kg)   LMP 09/10/2016 (Exact Date)   SpO2 98%   BMI 40.67 kg/m  Wt Readings from Last 3 Encounters:  07/17/20 252 lb (114.3 kg)  03/24/20 242 lb (109.8 kg)   12/20/19 238 lb 9.6 oz (108.2 kg)     Lab Results  Component Value Date   WBC 4.7 07/13/2020   HGB 12.9 07/13/2020   HCT 39.4 07/13/2020   PLT 267.0 07/13/2020   GLUCOSE 101 (H) 07/13/2020   CHOL 194 07/13/2020   TRIG 79.0 07/13/2020   HDL 42.50 07/13/2020   LDLCALC 135 (H) 07/13/2020   ALT 17 07/13/2020   AST 17 07/13/2020   NA 142 07/13/2020   K 4.2 07/13/2020   CL 106 07/13/2020   CREATININE 0.82 07/13/2020   BUN 15 07/13/2020   CO2 31 07/13/2020  TSH 2.42 07/13/2020   HGBA1C 5.8 07/13/2020    MM 3D SCREEN BREAST BILATERAL  Result Date: 06/21/2020 CLINICAL DATA:  Screening. EXAM: DIGITAL SCREENING BILATERAL MAMMOGRAM WITH TOMOSYNTHESIS AND CAD TECHNIQUE: Bilateral screening digital craniocaudal and mediolateral oblique mammograms were obtained. Bilateral screening digital breast tomosynthesis was performed. The images were evaluated with computer-aided detection. COMPARISON:  Previous exam(s). ACR Breast Density Category b: There are scattered areas of fibroglandular density. FINDINGS: There are no findings suspicious for malignancy. The images were evaluated with computer-aided detection. IMPRESSION: No mammographic evidence of malignancy. A result letter of this screening mammogram will be mailed directly to the patient. RECOMMENDATION: Screening mammogram in one year. (Code:SM-B-01Y) BI-RADS CATEGORY  1: Negative. Electronically Signed   By: Franki Cabot M.D.   On: 06/21/2020 10:28       Assessment & Plan:   Problem List Items Addressed This Visit    Back pain    Check chest xray per pt request.  No sob.  No pain with deep breathing.  Appears to be msk in origin.       Relevant Orders   DG Chest 2 View (Completed)   Health care maintenance    Physical today 07/17/20.  Has been followed by gyn.  Mammogram 06/21/20 - Birads I.  Colonoscopy 2014.        Hypercholesterolemia    The 10-year ASCVD risk score Mikey Bussing DC Brooke Bonito., et al., 2013) is: 10.2%   Values used to  calculate the score:     Age: 85 years     Sex: Female     Is Non-Hispanic African American: Yes     Diabetic: No     Tobacco smoker: No     Systolic Blood Pressure: 330 mmHg     Is BP treated: No     HDL Cholesterol: 42.5 mg/dL     Total Cholesterol: 194 mg/dL  Discussed starting crestor.  Low cholesterol diet and exercise.  Follow lipid panel and liver function tests.        Relevant Medications   rosuvastatin (CRESTOR) 5 MG tablet   Other Relevant Orders   Hepatic function panel   Hyperglycemia    Low carb diet and exercise.  Follow met b and a1c.       Stress    Overall appears to be handling stress well.  Discussed.  Follow.         Other Visit Diagnoses    Routine general medical examination at a health care facility    -  Primary       Einar Pheasant, MD

## 2020-07-19 ENCOUNTER — Telehealth: Payer: Self-pay | Admitting: Internal Medicine

## 2020-07-19 NOTE — Telephone Encounter (Signed)
Patient is aware 

## 2020-07-19 NOTE — Telephone Encounter (Signed)
Have her hold at this time.  Thanks

## 2020-07-19 NOTE — Telephone Encounter (Signed)
Pt called and wanted to know if she could donate blood

## 2020-07-24 ENCOUNTER — Encounter: Payer: Self-pay | Admitting: Internal Medicine

## 2020-07-24 NOTE — Assessment & Plan Note (Signed)
Physical today 07/17/20.  Has been followed by gyn.  Mammogram 06/21/20 - Birads I.  Colonoscopy 2014.

## 2020-07-24 NOTE — Assessment & Plan Note (Signed)
Check chest xray per pt request.  No sob.  No pain with deep breathing.  Appears to be msk in origin.

## 2020-07-24 NOTE — Assessment & Plan Note (Signed)
Overall appears to be handling stress well.  Discussed.  Follow.   

## 2020-07-24 NOTE — Assessment & Plan Note (Signed)
Low carb diet and exercise.  Follow met b and a1c.  

## 2020-08-04 ENCOUNTER — Ambulatory Visit (INDEPENDENT_AMBULATORY_CARE_PROVIDER_SITE_OTHER): Payer: Medicare HMO

## 2020-08-04 VITALS — Ht 66.0 in | Wt 252.0 lb

## 2020-08-04 DIAGNOSIS — Z0000 Encounter for general adult medical examination without abnormal findings: Secondary | ICD-10-CM | POA: Diagnosis not present

## 2020-08-04 NOTE — Patient Instructions (Addendum)
Nicole Hardy , Thank you for taking time to come for your Medicare Wellness Visit. I appreciate your ongoing commitment to your health goals. Please review the following plan we discussed and let me know if I can assist you in the future.   These are the goals we discussed: Goals      Patient Stated   .  Maintain Healthy Lifestyle (pt-stated)      Stay active Healthy diet        This is a list of the screening recommended for you and due dates:  Health Maintenance  Topic Date Due  . Tetanus Vaccine  08/04/2021*  . Pneumonia vaccines (1 of 2 - PCV13) 08/04/2021*  . Mammogram  06/21/2022  . Colon Cancer Screening  07/24/2022  . DEXA scan (bone density measurement)  Completed  . COVID-19 Vaccine  Completed  .  Hepatitis C: One time screening is recommended by Center for Disease Control  (CDC) for  adults born from [birth year] through 1963.   Completed  . HPV Vaccine  Aged Out  . Flu Shot  Discontinued  *Topic was postponed. The date shown is not the original due date.    Immunizations Immunization History  Administered Date(s) Administered  . PFIZER(Purple Top)SARS-COV-2 Vaccination 08/03/2019, 08/23/2019  . PPD Test 12/25/2015, 11/20/2016   Keep all routine maintenance appointments.   Next scheduled lab 08/28/20 @ 9:45  Lab 11/14/20 @ 9:00  Follow up 11/16/20 @ 10:00  Advanced directives: End of life planning; Advance aging; Advanced directives discussed.  Copy of current HCPOA/Living Will requested.    Conditions/risks identified: none new.  Follow up in one year for your annual wellness visit.   Preventive Care 52 Years and Older, Female Preventive care refers to lifestyle choices and visits with your health care provider that can promote health and wellness. What does preventive care include?  A yearly physical exam. This is also called an annual well check.  Dental exams once or twice a year.  Routine eye exams. Ask your health care provider how often you should have  your eyes checked.  Personal lifestyle choices, including:  Daily care of your teeth and gums.  Regular physical activity.  Eating a healthy diet.  Avoiding tobacco and drug use.  Limiting alcohol use.  Practicing safe sex.  Taking low-dose aspirin every day.  Taking vitamin and mineral supplements as recommended by your health care provider. What happens during an annual well check? The services and screenings done by your health care provider during your annual well check will depend on your age, overall health, lifestyle risk factors, and family history of disease. Counseling  Your health care provider may ask you questions about your:  Alcohol use.  Tobacco use.  Drug use.  Emotional well-being.  Home and relationship well-being.  Sexual activity.  Eating habits.  History of falls.  Memory and ability to understand (cognition).  Work and work Astronomer.  Reproductive health. Screening  You may have the following tests or measurements:  Height, weight, and BMI.  Blood pressure.  Lipid and cholesterol levels. These may be checked every 5 years, or more frequently if you are over 40 years old.  Skin check.  Lung cancer screening. You may have this screening every year starting at age 45 if you have a 30-pack-year history of smoking and currently smoke or have quit within the past 15 years.  Fecal occult blood test (FOBT) of the stool. You may have this test every year starting at age  50.  Flexible sigmoidoscopy or colonoscopy. You may have a sigmoidoscopy every 5 years or a colonoscopy every 10 years starting at age 12.  Hepatitis C blood test.  Hepatitis B blood test.  Sexually transmitted disease (STD) testing.  Diabetes screening. This is done by checking your blood sugar (glucose) after you have not eaten for a while (fasting). You may have this done every 1-3 years.  Bone density scan. This is done to screen for osteoporosis. You may have  this done starting at age 66.  Mammogram. This may be done every 1-2 years. Talk to your health care provider about how often you should have regular mammograms. Talk with your health care provider about your test results, treatment options, and if necessary, the need for more tests. Vaccines  Your health care provider may recommend certain vaccines, such as:  Influenza vaccine. This is recommended every year.  Tetanus, diphtheria, and acellular pertussis (Tdap, Td) vaccine. You may need a Td booster every 10 years.  Zoster vaccine. You may need this after age 58.  Pneumococcal 13-valent conjugate (PCV13) vaccine. One dose is recommended after age 45.  Pneumococcal polysaccharide (PPSV23) vaccine. One dose is recommended after age 62. Talk to your health care provider about which screenings and vaccines you need and how often you need them. This information is not intended to replace advice given to you by your health care provider. Make sure you discuss any questions you have with your health care provider. Document Released: 04/21/2015 Document Revised: 12/13/2015 Document Reviewed: 01/24/2015 Elsevier Interactive Patient Education  2017 [place] Prevention in the Home Falls can cause injuries. They can happen to people of all ages. There are many things you can do to make your home safe and to help prevent falls. What can I do on the outside of my home?  Regularly fix the edges of walkways and driveways and fix any cracks.  Remove anything that might make you trip as you walk through a door, such as a raised step or threshold.  Trim any bushes or trees on the path to your home.  Use bright outdoor lighting.  Clear any walking paths of anything that might make someone trip, such as rocks or tools.  Regularly check to see if handrails are loose or broken. Make sure that both sides of any steps have handrails.  Any raised decks and porches should have guardrails on  the edges.  Have any leaves, snow, or ice cleared regularly.  Use sand or salt on walking paths during winter.  Clean up any spills in your garage right away. This includes oil or grease spills. What can I do in the bathroom?  Use night lights.  Install grab bars by the toilet and in the tub and shower. Do not use towel bars as grab bars.  Use non-skid mats or decals in the tub or shower.  If you need to sit down in the shower, use a plastic, non-slip stool.  Keep the floor dry. Clean up any water that spills on the floor as soon as it happens.  Remove soap buildup in the tub or shower regularly.  Attach bath mats securely with double-sided non-slip rug tape.  Do not have throw rugs and other things on the floor that can make you trip. What can I do in the bedroom?  Use night lights.  Make sure that you have a light by your bed that is easy to reach.  Do not use any sheets  or blankets that are too big for your bed. They should not hang down onto the floor.  Have a firm chair that has side arms. You can use this for support while you get dressed.  Do not have throw rugs and other things on the floor that can make you trip. What can I do in the kitchen?  Clean up any spills right away.  Avoid walking on wet floors.  Keep items that you use a lot in easy-to-reach places.  If you need to reach something above you, use a strong step stool that has a grab bar.  Keep electrical cords out of the way.  Do not use floor polish or wax that makes floors slippery. If you must use wax, use non-skid floor wax.  Do not have throw rugs and other things on the floor that can make you trip. What can I do with my stairs?  Do not leave any items on the stairs.  Make sure that there are handrails on both sides of the stairs and use them. Fix handrails that are broken or loose. Make sure that handrails are as long as the stairways.  Check any carpeting to make sure that it is firmly  attached to the stairs. Fix any carpet that is loose or worn.  Avoid having throw rugs at the top or bottom of the stairs. If you do have throw rugs, attach them to the floor with carpet tape.  Make sure that you have a light switch at the top of the stairs and the bottom of the stairs. If you do not have them, ask someone to add them for you. What else can I do to help prevent falls?  Wear shoes that:  Do not have high heels.  Have rubber bottoms.  Are comfortable and fit you well.  Are closed at the toe. Do not wear sandals.  If you use a stepladder:  Make sure that it is fully opened. Do not climb a closed stepladder.  Make sure that both sides of the stepladder are locked into place.  Ask someone to hold it for you, if possible.  Clearly mark and make sure that you can see:  Any grab bars or handrails.  First and last steps.  Where the edge of each step is.  Use tools that help you move around (mobility aids) if they are needed. These include:  Canes.  Walkers.  Scooters.  Crutches.  Turn on the lights when you go into a dark area. Replace any light bulbs as soon as they burn out.  Set up your furniture so you have a clear path. Avoid moving your furniture around.  If any of your floors are uneven, fix them.  If there are any pets around you, be aware of where they are.  Review your medicines with your doctor. Some medicines can make you feel dizzy. This can increase your chance of falling. Ask your doctor what other things that you can do to help prevent falls. This information is not intended to replace advice given to you by your health care provider. Make sure you discuss any questions you have with your health care provider. Document Released: 01/19/2009 Document Revised: 08/31/2015 Document Reviewed: 04/29/2014 Elsevier Interactive Patient Education  2017 [employer].

## 2020-08-04 NOTE — Progress Notes (Signed)
Subjective:   Nicole Hardy is a 70 y.o. female who presents for Medicare Annual (Subsequent) preventive examination.  Review of Systems    No ROS.  Medicare Wellness Virtual Visit.    Cardiac Risk Factors include: advanced age (>70men, >26 women)     Objective:    Today's Vitals   08/04/20 1214  Weight: 252 lb (114.3 kg)  Height: 5\' 6"  (1.676 m)   Body mass index is 40.67 kg/m.  Advanced Directives 08/04/2020 08/04/2019 08/03/2018 04/24/2015 04/18/2015 11/17/2014  Does Patient Have a Medical Advance Directive? Yes No No No Yes;No No  Type of 01/17/2015 of [place];Living will - - - - -  Does patient want to make changes to medical advance directive? No - Patient declined No - Patient declined - - No - Patient declined -  Copy of Healthcare Power of Attorney in Chart? No - copy requested - - - - -  Would patient like information on creating a medical advance directive? - - No - Patient declined - - No - patient declined information    Current Medications (verified) Outpatient Encounter Medications as of 08/04/2020  Medication Sig  . rosuvastatin (CRESTOR) 5 MG tablet Take 1 tablet 2x/week.   No facility-administered encounter medications on file as of 08/04/2020.    Allergies (verified) Metronidazole   History: Past Medical History:  Diagnosis Date  . Complication of anesthesia   . Degenerative disc disease, cervical    cervical  . Hypercholesterolemia   . Meralgia paraesthetica   . Migraines   . Nephrolithiasis   . Neurodermatitis   . Obesity   . Osteopenia   . PONV (postoperative nausea and vomiting)   . Vitamin D deficiency    Past Surgical History:  Procedure Laterality Date  . CHOLECYSTECTOMY N/A 04/24/2015   Procedure: LAPAROSCOPIC CHOLECYSTECTOMY WITH INTRAOPERATIVE CHOLANGIOGRAM;  Surgeon: 04/26/2015, MD;  Location: [facility] ORS;  Service: General;  Laterality: N/A;  . EXTRACORPOREAL SHOCK WAVE LITHOTRIPSY Right 11/17/2014    Procedure: EXTRACORPOREAL SHOCK WAVE LITHOTRIPSY (ESWL);  Surgeon: 01/17/2015, MD;  Location: [facility] ORS;  Service: Urology;  Laterality: Right;  . EXTRACORPOREAL SHOCK WAVE LITHOTRIPSY Left 12/15/2014   Procedure: EXTRACORPOREAL SHOCK WAVE LITHOTRIPSY (ESWL);  Surgeon: 02/14/2015, MD;  Location: [facility] ORS;  Service: Urology;  Laterality: Left;  . laparoscopic surgery  1990   nephrolithiasis (Dr Orson Ape)  . LITHOTRIPSY  2010, 1980's  . pessary  2003, 2016   Family History  Problem Relation Age of Onset  . Hypertension Mother   . Hypertension Father   . Asthma Sister   . Hypertension Sister   . Diabetes Sister   . Breast cancer Sister 73  . Rectal cancer Sister    Social History   Socioeconomic History  . Marital status: Married    Spouse name: Not on file  . Number of children: 1  . Years of education: Not on file  . Highest education level: Not on file  Occupational History  . Not on file  Tobacco Use  . Smoking status: Never Smoker  . Smokeless tobacco: Never Used  Vaping Use  . Vaping Use: Never used  Substance and Sexual Activity  . Alcohol use: No    Alcohol/week: 0.0 standard drinks  . Drug use: No  . Sexual activity: Not Currently    Birth control/protection: Surgical  Other Topics Concern  . Not on file  Social History Narrative  . Not on file   Social Determinants  of Health   Financial Resource Strain: Low Risk   . Difficulty of Paying Living Expenses: Not hard at all  Food Insecurity: No Food Insecurity  . Worried About Programme researcher, broadcasting/film/videounning Out of Food in the Last Year: Never true  . Ran Out of Food in the Last Year: Never true  Transportation Needs: No Transportation Needs  . Lack of Transportation (Medical): No  . Lack of Transportation (Non-Medical): No  Physical Activity: Insufficiently Active  . Days of Exercise per Week: 2 days  . Minutes of Exercise per Session: 30 min  Stress: No Stress Concern Present  . Feeling of Stress : Not at all  Social  Connections: Unknown  . Frequency of Communication with Friends and Family: More than three times a week  . Frequency of Social Gatherings with Friends and Family: Not on file  . Attends Religious Services: Not on file  . Active Member of Clubs or Organizations: Not on file  . Attends BankerClub or Organization Meetings: Not on file  . Marital Status: Not on file    Tobacco Counseling Counseling given: Not Answered   Clinical Intake:  Pre-visit preparation completed: Yes        Diabetes: No  How often do you need to have someone help you when you read instructions, pamphlets, or other written materials from your doctor or pharmacy?: 1 - Never  Interpreter Needed?: No      Activities of Daily Living In your present state of health, do you have any difficulty performing the following activities: 08/04/2020  Hearing? N  Vision? N  Difficulty concentrating or making decisions? N  Walking or climbing stairs? N  Dressing or bathing? N  Doing errands, shopping? N  Preparing Food and eating ? N  Using the Toilet? N  In the past six months, have you accidently leaked urine? N  Do you have problems with loss of bowel control? N  Managing your Medications? N  Managing your Finances? N  Housekeeping or managing your Housekeeping? N  Some recent data might be hidden    Patient Care Team: Dale DurhamScott, Charlene, MD as PCP - General (Internal Medicine) Lemar LivingsByrnett, Merrily PewJeffrey W, MD as Consulting Physician (General Surgery) Dale DurhamScott, Charlene, MD as Referring Physician (Internal Medicine)  Indicate any recent Medical Services you may have received from other than [facility] providers in the past year (date may be approximate).     Assessment:   This is a routine wellness examination for Nicole Hardy.  I connected with Nicole Hardy today by telephone and verified that I am speaking with the correct person using two identifiers. Location patient: home Location provider: work Persons participating in the virtual visit:  patient, Engineer, civil (consulting)nurse.    I discussed the limitations, risks, security and privacy concerns of performing an evaluation and management service by telephone and the availability of in person appointments. The patient expressed understanding and verbally consented to this telephonic visit.    Interactive audio and video telecommunications were attempted between this provider and patient, however failed, due to patient having technical difficulties OR patient did not have access to video capability.  We continued and completed visit with audio only.  Some vital signs may be absent or patient reported.   Hearing/Vision screen  Hearing Screening   125Hz  250Hz  500Hz  1000Hz  2000Hz  3000Hz  4000Hz  6000Hz  8000Hz   Right ear:           Left ear:           Comments: Patient is able to hear conversational tones without  difficulty.  No issues reported.  Vision Screening Comments: Followed [facility]  Wears corrective lenses  No vision screening; virtual visit  She has seen her ophthalmologist within the last 12 months  Dietary issues and exercise activities discussed: Current Exercise Habits: Home exercise routine, Intensity: Mild  Regular diet Good water  Goals      Patient Stated   .  Maintain Healthy Lifestyle (pt-stated)      Stay active Healthy diet       Depression Screen PHQ 2/9 Scores 08/04/2020 12/20/2019 08/04/2019 08/03/2018 06/09/2018 01/08/2017 12/22/2015  PHQ - 2 Score 0 0 0 0 0 0 0  PHQ- 9 Score - - - 0 0 - -    Fall Risk Fall Risk  08/04/2020 03/23/2020 12/20/2019 08/04/2019 08/03/2018  Falls in the past year? 0 0 0 0 0  Number falls in past yr: 0 - 0 - -  Injury with Fall? 0 - 0 - -  Follow up Falls evaluation completed Falls evaluation completed Falls evaluation completed Falls evaluation completed -    FALL RISK PREVENTION PERTAINING TO THE HOME: Handrails in use when climbing stairs? Yes Home free of loose throw rugs in walkways, pet beds, electrical cords, etc? Yes   Adequate lighting in your home to reduce risk of falls? Yes   ASSISTIVE DEVICES UTILIZED TO PREVENT FALLS: Life alert? No  Use of a cane, walker or w/c? No  Grab bars in the bathroom? No  Shower chair or bench in shower? No  Elevated toilet seat or a handicapped toilet? Yes   TIMED UP AND GO: Was the test performed? No . Virtual visit.   Cognitive Function: Patient is alert and oriented x3.  Denies difficulty focusing, making decisions, memory loss.  Enjoys socializing and other brain health activities. MMSE/6CIT deferred. Normal by direct communication/observation.     6CIT Screen 08/04/2019 08/03/2018  What Year? 0 points 0 points  What month? 0 points 0 points  What time? 0 points 0 points  Count back from 20 - 0 points  Months in reverse - 0 points  Repeat phrase - 0 points  Total Score - 0    Immunizations Immunization History  Administered Date(s) Administered  . PFIZER(Purple Top)SARS-COV-2 Vaccination 08/03/2019, 08/23/2019, 04/03/2020  . PPD Test 12/25/2015, 11/20/2016    TDAP status: Due, Education has been provided regarding the importance of this vaccine. Advised may receive this vaccine at local pharmacy or Health Dept. Aware to provide a copy of the vaccination record if obtained from local pharmacy or Health Dept. Verbalized acceptance and understanding. Deferred.   Health Maintenance Health Maintenance  Topic Date Due  . TETANUS/TDAP  08/04/2021 (Originally [birth year]/1971)  . PNA vac Low Risk Adult (1 of 2 - PCV13) 08/04/2021 (Originally 06/08/2017)  . MAMMOGRAM  06/21/2022  . COLONOSCOPY (Pts 45-12yrs Insurance coverage will need to be confirmed)  07/24/2022  . DEXA SCAN  Completed  . COVID-19 Vaccine  Completed  . Hepatitis C Screening  Completed  . HPV VACCINES  Aged Out  . INFLUENZA VACCINE  Discontinued    Colorectal cancer screening: Type of screening: Colonoscopy. Completed 07/23/12. Repeat every 10 years   Mammogram status: Completed 06/20/20.  Repeat every year  Lung Cancer Screening: (Low Dose CT Chest recommended if Age 42-80 years, 30 pack-year currently smoking OR have quit w/in 15years.) does not qualify.   Vision Screening: Recommended annual ophthalmology exams for early detection of glaucoma and other disorders of the eye. Is the patient  up to date with their annual eye exam?  Yes   Dental Screening: Recommended annual dental exams for proper oral hygiene.  Community Resource Referral / Chronic Care Management: CRR required this visit?  No   CCM required this visit?  No      Plan:   Keep all routine maintenance appointments.   Next scheduled lab 08/28/20 @ 9:45  Lab 11/14/20 @ 9:00  Follow up 11/16/20 @ 10:00  I have personally reviewed and noted the following in the patient's chart:   . Medical and social history . Use of alcohol, tobacco or illicit drugs  . Current medications and supplements . Functional ability and status . Nutritional status . Physical activity . Advanced directives . List of other physicians . Hospitalizations, surgeries, and ER visits in previous 12 months . Vitals . Screenings to include cognitive, depression, and falls . Referrals and appointments  In addition, I have reviewed and discussed with patient certain preventive protocols, quality metrics, and best practice recommendations. A written personalized care plan for preventive services as well as general preventive health recommendations were provided to patient via mail.     Ashok Pall, LPN   06/29/4008

## 2020-08-28 ENCOUNTER — Other Ambulatory Visit (INDEPENDENT_AMBULATORY_CARE_PROVIDER_SITE_OTHER): Payer: Medicare HMO

## 2020-08-28 ENCOUNTER — Other Ambulatory Visit: Payer: Self-pay

## 2020-08-28 DIAGNOSIS — E7800 Pure hypercholesterolemia, unspecified: Secondary | ICD-10-CM

## 2020-08-28 LAB — HEPATIC FUNCTION PANEL
ALT: 14 U/L (ref 0–35)
AST: 15 U/L (ref 0–37)
Albumin: 3.8 g/dL (ref 3.5–5.2)
Alkaline Phosphatase: 70 U/L (ref 39–117)
Bilirubin, Direct: 0.1 mg/dL (ref 0.0–0.3)
Total Bilirubin: 0.8 mg/dL (ref 0.2–1.2)
Total Protein: 7.1 g/dL (ref 6.0–8.3)

## 2020-11-14 ENCOUNTER — Other Ambulatory Visit: Payer: Medicare HMO

## 2020-11-16 ENCOUNTER — Ambulatory Visit: Payer: Medicare HMO | Admitting: Internal Medicine

## 2020-11-30 ENCOUNTER — Telehealth: Payer: Self-pay | Admitting: *Deleted

## 2020-11-30 DIAGNOSIS — R739 Hyperglycemia, unspecified: Secondary | ICD-10-CM

## 2020-11-30 DIAGNOSIS — E7800 Pure hypercholesterolemia, unspecified: Secondary | ICD-10-CM

## 2020-11-30 NOTE — Telephone Encounter (Signed)
Orders placed for f/u labs.  

## 2020-11-30 NOTE — Telephone Encounter (Signed)
Please place future orders for lab appt.  

## 2020-12-01 ENCOUNTER — Other Ambulatory Visit: Payer: Self-pay

## 2020-12-01 ENCOUNTER — Other Ambulatory Visit (INDEPENDENT_AMBULATORY_CARE_PROVIDER_SITE_OTHER): Payer: Medicare HMO

## 2020-12-01 DIAGNOSIS — E7800 Pure hypercholesterolemia, unspecified: Secondary | ICD-10-CM

## 2020-12-01 DIAGNOSIS — R739 Hyperglycemia, unspecified: Secondary | ICD-10-CM

## 2020-12-01 LAB — BASIC METABOLIC PANEL
BUN: 10 mg/dL (ref 6–23)
CO2: 28 mEq/L (ref 19–32)
Calcium: 8.9 mg/dL (ref 8.4–10.5)
Chloride: 107 mEq/L (ref 96–112)
Creatinine, Ser: 0.75 mg/dL (ref 0.40–1.20)
GFR: 81.77 mL/min (ref >60.00)
Glucose, Bld: 95 mg/dL (ref 70–99)
Potassium: 4.2 mEq/L (ref 3.5–5.1)
Sodium: 142 mEq/L (ref 135–145)

## 2020-12-01 LAB — LIPID PANEL
Cholesterol: 151 mg/dL (ref 0–200)
HDL: 36 mg/dL — ABNORMAL LOW (ref >39.00)
LDL Cholesterol: 97 mg/dL (ref 0–99)
NonHDL: 115.2
Total CHOL/HDL Ratio: 4
Triglycerides: 90 mg/dL (ref 0.0–149.0)
VLDL: 18 mg/dL (ref 0.0–40.0)

## 2020-12-01 LAB — HEMOGLOBIN A1C: Hgb A1c MFr Bld: 5.8 % (ref 4.6–6.5)

## 2020-12-01 LAB — HEPATIC FUNCTION PANEL
ALT: 16 U/L (ref 0–35)
AST: 18 U/L (ref 0–37)
Albumin: 3.7 g/dL (ref 3.5–5.2)
Alkaline Phosphatase: 72 U/L (ref 39–117)
Bilirubin, Direct: 0.2 mg/dL (ref 0.0–0.3)
Total Bilirubin: 0.8 mg/dL (ref 0.2–1.2)
Total Protein: 6.9 g/dL (ref 6.0–8.3)

## 2020-12-05 ENCOUNTER — Other Ambulatory Visit: Payer: Self-pay

## 2020-12-05 ENCOUNTER — Ambulatory Visit (INDEPENDENT_AMBULATORY_CARE_PROVIDER_SITE_OTHER): Payer: Medicare HMO | Admitting: Internal Medicine

## 2020-12-05 VITALS — BP 128/78 | HR 78 | Temp 97.8°F | Resp 16 | Ht 66.0 in | Wt 254.0 lb

## 2020-12-05 DIAGNOSIS — H539 Unspecified visual disturbance: Secondary | ICD-10-CM | POA: Diagnosis not present

## 2020-12-05 DIAGNOSIS — R739 Hyperglycemia, unspecified: Secondary | ICD-10-CM | POA: Diagnosis not present

## 2020-12-05 DIAGNOSIS — F439 Reaction to severe stress, unspecified: Secondary | ICD-10-CM

## 2020-12-05 DIAGNOSIS — E7800 Pure hypercholesterolemia, unspecified: Secondary | ICD-10-CM

## 2020-12-05 DIAGNOSIS — R632 Polyphagia: Secondary | ICD-10-CM

## 2020-12-05 DIAGNOSIS — E559 Vitamin D deficiency, unspecified: Secondary | ICD-10-CM | POA: Diagnosis not present

## 2020-12-05 NOTE — Progress Notes (Signed)
[name], female   DOB: 09/05/1950, 70 y.o.   MRN: [id number]   Subjective:    [name], female    DOB: 05/25/1950, 70 y.o.   MRN: [id number]  This visit occurred during the SARS-CoV-2 public health emergency.  Safety protocols were in place, including screening questions prior to the visit, additional usage of staff PPE, and extensive cleaning of exam room while observing appropriate contact time as indicated for disinfecting solutions.   Patient here for scheduled follow up.  Chief Complaint  Patient presents with   Hyperglycemia   Hyperlipidemia   Headache   Increased Appetite   .   HPI Is s/p cataract surgery in June and July.  Initially headaches after - better.  Left double vision after - seeing eye MD for f/u.  No pain.  No chest pain or sob reported.  No abdominal pain or bowel change reported.  Is concerned regarding her appetite and weight.  Increased appetite.  Brought up taking an otc appetite suppressant/weight loss medication.  Discussed prescription medications.  She is interested in looking into this furhter.  Overall appears to be handling stress ok.  Miralax - helped bowels.  Ears ok.    Past Medical History:  Diagnosis Date   Complication of anesthesia    Degenerative disc disease, cervical    cervical   Hypercholesterolemia    Meralgia paraesthetica    Migraines    Nephrolithiasis    Neurodermatitis    Obesity    Osteopenia    PONV (postoperative nausea and vomiting)    Vitamin D deficiency    Past Surgical History:  Procedure Laterality Date   CHOLECYSTECTOMY N/A 04/24/2015   Procedure: LAPAROSCOPIC CHOLECYSTECTOMY WITH INTRAOPERATIVE CHOLANGIOGRAM;  Surgeon: Jeffrey W Byrnett, MD;  Location: [facility] ORS;  Service: General;  Laterality: N/A;   EXTRACORPOREAL SHOCK WAVE LITHOTRIPSY Right 11/17/2014   Procedure: EXTRACORPOREAL SHOCK WAVE LITHOTRIPSY (ESWL);  Surgeon: Michael R Wolff, MD;  Location: [facility] ORS;  Service: Urology;   Laterality: Right;   EXTRACORPOREAL SHOCK WAVE LITHOTRIPSY Left 12/15/2014   Procedure: EXTRACORPOREAL SHOCK WAVE LITHOTRIPSY (ESWL);  Surgeon: Michael R Wolff, MD;  Location: [facility] ORS;  Service: Urology;  Laterality: Left;   laparoscopic surgery  1990   nephrolithiasis (Dr Harmon)   LITHOTRIPSY  2010, 1980's   pessary  2003, 2016   Family History  Problem Relation Age of Onset   Hypertension Mother    Hypertension Father    Asthma Sister    Hypertension Sister    Diabetes Sister    Breast cancer Sister 60   Rectal cancer Sister    Social History   Socioeconomic History   Marital status: Married    Spouse name: Not on file   Number of children: 1   Years of education: Not on file   Highest education level: Not on file  Occupational History   Not on file  Tobacco Use   Smoking status: Never   Smokeless tobacco: Never  Vaping Use   Vaping Use: Never used  Substance and Sexual Activity   Alcohol use: No    Alcohol/week: 0.0 standard drinks   Drug use: No   Sexual activity: Not Currently    Birth control/protection: Surgical  Other Topics Concern   Not on file  Social History Narrative   Not on file   Social Determinants of Health   Financial Resource Strain: Low Risk    Difficulty of Paying Living Expenses: Not hard at all    Food Insecurity: No Food Insecurity   Worried About Charity fundraiser in the Last Year: Never true   Ran Out of Food in the Last Year: Never true  Transportation Needs: No Transportation Needs   Lack of Transportation (Medical): No   Lack of Transportation (Non-Medical): No  Physical Activity: Insufficiently Active   Days of Exercise per Week: 2 days   Minutes of Exercise per Session: 30 min  Stress: No Stress Concern Present   Feeling of Stress : Not at all  Social Connections: Unknown   Frequency of Communication with Friends and Family: More than three times a week   Frequency of Social Gatherings with Friends and Family: Not on file    Attends Religious Services: Not on file   Active Member of Clubs or Organizations: Not on file   Attends Archivist Meetings: Not on file   Marital Status: Not on file    Review of Systems  Constitutional:  Negative for appetite change and unexpected weight change.  HENT:  Negative for congestion and sinus pressure.   Respiratory:  Negative for cough, chest tightness and shortness of breath.   Cardiovascular:  Negative for chest pain, palpitations and leg swelling.  Gastrointestinal:  Negative for abdominal pain, diarrhea, nausea and vomiting.  Genitourinary:  Negative for difficulty urinating and dysuria.  Musculoskeletal:  Negative for joint swelling and myalgias.  Skin:  Negative for color change and rash.  Neurological:  Negative for dizziness, light-headedness and headaches.  Psychiatric/Behavioral:  Negative for agitation and dysphoric mood.       Objective:     BP 128/78   Pulse 78   Temp 97.8 F (36.6 C)   Resp 16   Ht 5' 6" (1.676 m)   Wt 254 lb (115.2 kg)   LMP 09/10/2016 (Exact Date)   SpO2 99%   BMI 41.00 kg/m  Wt Readings from Last 3 Encounters:  12/05/20 254 lb (115.2 kg)  08/04/20 252 lb (114.3 kg)  07/17/20 252 lb (114.3 kg)    Physical Exam Vitals reviewed.  Constitutional:      General: She is not in acute distress.    Appearance: Normal appearance.  HENT:     Head: Normocephalic and atraumatic.     Right Ear: External ear normal.     Left Ear: External ear normal.  Eyes:     General: No scleral icterus.       Right eye: No discharge.        Left eye: No discharge.     Conjunctiva/sclera: Conjunctivae normal.  Neck:     Thyroid: No thyromegaly.  Cardiovascular:     Rate and Rhythm: Normal rate and regular rhythm.  Pulmonary:     Effort: No respiratory distress.     Breath sounds: Normal breath sounds. No wheezing.  Abdominal:     General: Bowel sounds are normal.     Palpations: Abdomen is soft.     Tenderness: There is no  abdominal tenderness.  Musculoskeletal:        General: No swelling or tenderness.     Cervical back: Neck supple. No tenderness.  Lymphadenopathy:     Cervical: No cervical adenopathy.  Skin:    Findings: No erythema or rash.  Neurological:     Mental Status: She is alert.  Psychiatric:        Mood and Affect: Mood normal.        Behavior: Behavior normal.     Outpatient Encounter Medications  as of 12/05/2020  Medication Sig   Vitamin D, Cholecalciferol, 25 MCG (1000 UT) TABS Take 1,000 Units by mouth daily.   rosuvastatin (CRESTOR) 5 MG tablet Take 1 tablet 2x/week.   No facility-administered encounter medications on file as of 12/05/2020.     Lab Results  Component Value Date   WBC 4.7 07/13/2020   HGB 12.9 07/13/2020   HCT 39.4 07/13/2020   PLT 267.0 07/13/2020   GLUCOSE 95 12/01/2020   CHOL 151 12/01/2020   TRIG 90.0 12/01/2020   HDL 36.00 (L) 12/01/2020   LDLCALC 97 12/01/2020   ALT 16 12/01/2020   AST 18 12/01/2020   NA 142 12/01/2020   K 4.2 12/01/2020   CL 107 12/01/2020   CREATININE 0.75 12/01/2020   BUN 10 12/01/2020   CO2 28 12/01/2020   TSH 2.42 07/13/2020   HGBA1C 5.8 12/01/2020    MM 3D SCREEN BREAST BILATERAL  Result Date: 06/21/2020 CLINICAL DATA:  Screening. EXAM: DIGITAL SCREENING BILATERAL MAMMOGRAM WITH TOMOSYNTHESIS AND CAD TECHNIQUE: Bilateral screening digital craniocaudal and mediolateral oblique mammograms were obtained. Bilateral screening digital breast tomosynthesis was performed. The images were evaluated with computer-aided detection. COMPARISON:  Previous exam(s). ACR Breast Density Category b: There are scattered areas of fibroglandular density. FINDINGS: There are no findings suspicious for malignancy. The images were evaluated with computer-aided detection. IMPRESSION: No mammographic evidence of malignancy. A result letter of this screening mammogram will be mailed directly to the patient. RECOMMENDATION: Screening mammogram in one  year. (Code:SM-B-01Y) BI-RADS CATEGORY  1: Negative. Electronically Signed   By: Stan  Maynard M.D.   On: 06/21/2020 10:28       Assessment & Plan:   Problem List Items Addressed This Visit     Hypercholesterolemia    On crestor.  Tolerating.  Cholesterol improved.  Low cholesterol diet and exercise.  Follow lipid panel and liver function tests.   Lab Results  Component Value Date   CHOL 151 12/01/2020   HDL 36.00 (L) 12/01/2020   LDLCALC 97 12/01/2020   TRIG 90.0 12/01/2020   CHOLHDL 4 12/01/2020       Relevant Orders   Basic metabolic panel   Hepatic function panel   Lipid panel   Hyperglycemia - Primary    Low carb diet and exercise.  Follow met b and a1c.   Lab Results  Component Value Date   HGBA1C 5.8 12/01/2020       Relevant Orders   Hemoglobin A1c   Increased appetite    Increased appetite.  BMI 41.  Hold on otc weight loss medications.  a1c 5.8.  Discussed prescription medication.  Discussed possible GLP1 receptor agonists.  She is interested.  CCM referral to discuss.        Relevant Orders   AMB Referral to Community Care Coordinaton   Stress    Overall appears to be handling stress well.  Discussed.  Follow.        Vision changes    S/p cataract surgery (bilateral).  Seeing eye MD.       Vitamin D deficiency    Continue vitamin D supplements.  Follow vitamin D level.         Charlene Scott, MD  

## 2020-12-10 ENCOUNTER — Encounter: Payer: Self-pay | Admitting: Internal Medicine

## 2020-12-10 DIAGNOSIS — R632 Polyphagia: Secondary | ICD-10-CM | POA: Insufficient documentation

## 2020-12-10 NOTE — Assessment & Plan Note (Signed)
S/p cataract surgery (bilateral).  Seeing eye MD.

## 2020-12-10 NOTE — Assessment & Plan Note (Signed)
On crestor.  Tolerating.  Cholesterol improved.  Low cholesterol diet and exercise.  Follow lipid panel and liver function tests.   Lab Results  Component Value Date   CHOL 151 12/01/2020   HDL 36.00 (L) 12/01/2020   LDLCALC 97 12/01/2020   TRIG 90.0 12/01/2020   CHOLHDL 4 12/01/2020

## 2020-12-10 NOTE — Assessment & Plan Note (Addendum)
Increased appetite.  BMI 41.  Hold on otc weight loss medications.  a1c 5.8.  Discussed prescription medication.  Discussed possible GLP1 receptor agonists.  She is interested.  CCM referral to discuss.

## 2020-12-10 NOTE — Assessment & Plan Note (Signed)
Continue vitamin D supplements.  Follow vitamin D level.   

## 2020-12-10 NOTE — Assessment & Plan Note (Signed)
Overall appears to be handling stress well.  Discussed.  Follow.   

## 2020-12-10 NOTE — Assessment & Plan Note (Signed)
Low carb diet and exercise.  Follow met b and a1c.   Lab Results  Component Value Date   HGBA1C 5.8 12/01/2020

## 2020-12-14 ENCOUNTER — Telehealth: Payer: Self-pay

## 2020-12-14 NOTE — Chronic Care Management (AMB) (Signed)
  Chronic Care Management   Outreach Note  12/14/2020 Name: Nicole Hardy MRN: [id number] DOB: Aug 27, 1950  Nicole Hardy is a 70 y.o. year old female who is a primary care patient of Dale [place], MD. I reached out to [name] by phone today in response to a referral sent by Ms. Pearson Forster PCP, Dale , MD      An unsuccessful telephone outreach was attempted today. The patient was referred to the case management team for assistance with care management and care coordination.   Follow Up Plan: A HIPAA compliant phone message was left for the patient providing contact information and requesting a return call.  The care management team will reach out to the patient again over the next 7 days.  If patient returns call to provider office, please advise to call Embedded Care Management Care Guide Penne Lash at [phone]  Penne Lash, RMA Care Guide, Embedded Care Coordination [facility]  [place], [place] [address] Direct Dial: [phone] [email or web address]: [email or web address]

## 2020-12-19 NOTE — Chronic Care Management (AMB) (Signed)
  Chronic Care Management   Outreach Note  12/19/2020 Name: Nicole Hardy MRN: [id number] DOB: 12/02/1950  Nicole Hardy is a 70 y.o. year old female who is a primary care patient of Dale [place], MD. I reached out to [name] by phone today in response to a referral sent by Ms. Pearson Forster PCP, Dale Doug[place], MD      A second unsuccessful telephone outreach was attempted today. The patient was referred to the case management team for assistance with care management and care coordination.   Follow Up Plan: A HIPAA compliant phone message was left for the patient providing contact information and requesting a return call.  The care management team will reach out to the patient again over the next 5 days.  If patient returns call to provider office, please advise to call Embedded Care Management Care Guide Penne Lash at [phone]  Penne Lash, RMA Care Guide, Embedded Care Coordination [facility]  [place], [place] [address] Direct Dial: [phone] [email or web address]: [email or web address]

## 2020-12-27 NOTE — Chronic Care Management (AMB) (Signed)
  Chronic Care Management   Note  12/27/2020 Name: Brion Hedges MRN: [id number] DOB: 1950-11-15  Filicia Scogin is a 70 y.o. year old female who is a primary care patient of Einar Pheasant, MD. I reached out to [name] by phone today in response to a referral sent by Ms. Gerrianne Scale PCP.  Ms. Germano was given information about Chronic Care Management services today including:  CCM service includes personalized support from designated clinical staff supervised by her physician, including individualized plan of care and coordination with other care providers 24/7 contact phone numbers for assistance for urgent and routine care needs. Service will only be billed when office clinical staff spend 20 minutes or more in a month to coordinate care. Only one practitioner may furnish and bill the service in a calendar month. The patient may stop CCM services at any time (effective at the end of the month) by phone call to the office staff. The patient is responsible for co-pay (up to 20% after annual deductible is met) if co-pay is required by the individual health plan.   Patient agreed to services and verbal consent obtained.   Follow up plan: Telephone appointment with care management team member scheduled for:12/28/2020  Noreene Larsson, [place], [place], [place] [address] Direct Dial: [phone] [email or web address]

## 2020-12-28 ENCOUNTER — Telehealth: Payer: Self-pay | Admitting: Pharmacist

## 2020-12-28 ENCOUNTER — Ambulatory Visit (INDEPENDENT_AMBULATORY_CARE_PROVIDER_SITE_OTHER): Payer: Medicare HMO | Admitting: Pharmacist

## 2020-12-28 DIAGNOSIS — M8580 Other specified disorders of bone density and structure, unspecified site: Secondary | ICD-10-CM

## 2020-12-28 DIAGNOSIS — E7800 Pure hypercholesterolemia, unspecified: Secondary | ICD-10-CM

## 2020-12-28 DIAGNOSIS — R739 Hyperglycemia, unspecified: Secondary | ICD-10-CM

## 2020-12-28 MED ORDER — OZEMPIC (0.25 OR 0.5 MG/DOSE) 2 MG/1.5ML ~~LOC~~ SOPN: PEN_INJECTOR | SUBCUTANEOUS | 0 refills | Status: DC

## 2020-12-28 NOTE — Patient Instructions (Signed)
Nicole Hardy,   It was great talking with you today!  Start Ozempic 0.25 mg weekly for 4 weeks, then increase to 0.5 mg weekly. This medication may cause stomach upset, queasiness, or constipation, especially when first starting. This generally improves over time. Call our office if these symptoms occur and worsen, or if you have severe symptoms such as vomiting, diarrhea, or stomach pain.   I recommend you start focusing on planning out your meals to incorporate lean proteins (fish, chicken), vegetables and fruits, whole grains, increased fibers, and decreased carbohydrate intake.   Keep up the great work with exercising 3 days weekly.   Let me know if you have any questions or concerns!  Catie Darnelle Maffucci, PharmD  Visit Information   PATIENT GOALS:   Goals Addressed               This Visit's Progress     Patient Stated     Weight Loss (pt-stated)        Patient Goals/Self-Care Activities Over the next 90 days, patient will:  - take medications as prescribed target a minimum of 150 minutes of moderate intensity exercise weekly engage in dietary modifications by moderating carbohydrate portion sizes        Consent to CCM Services: Nicole Hardy was given information about Chronic Care Management services including:  CCM service includes personalized support from designated clinical staff supervised by her physician, including individualized plan of care and coordination with other care providers 24/7 contact phone numbers for assistance for urgent and routine care needs. Service will only be billed when office clinical staff spend 20 minutes or more in a month to coordinate care. Only one practitioner may furnish and bill the service in a calendar month. The patient may stop CCM services at any time (effective at the end of the month) by phone call to the office staff. The patient will be responsible for cost sharing (co-pay) of up to 20% of the service fee (after annual  deductible is met).  Patient agreed to services and verbal consent obtained.   Print copy of patient instructions, educational materials, and care plan provided in person.   Plan: Telephone follow up appointment with care management team member scheduled for:  ~ 4 weeks  Catie Darnelle Maffucci, PharmD, [place], CPP Clinical Pharmacist [place] [facility]: Patient Care Plan: Medication Management     Problem Identified: Obesity, Prediabetes, Hyperlipidemia      Long-Range Goal: Disease Progression Prevention   Start Date: 12/28/2020  This Visit's Progress: On track  Priority: High  Note:   Current Barriers:  Unable to independently improve weight through diet and lifestyle modifications  Pharmacist Clinical Goal(s):  Over the next 90 days, patient will achieve 5-10% weight loss from baseline through collaboration with PharmD and provider.   Interventions: 1:1 collaboration with Einar Pheasant, MD regarding development and update of comprehensive plan of care as evidenced by provider attestation and co-signature Inter-disciplinary care team collaboration (see longitudinal plan of care) Comprehensive medication review performed; medication list updated in electronic medical record  Overweight/Obesity Complicated by Hyperlipidemia: Unable to achieve goal weight loss through lifestyle modification alone. Reports her biggest struggle is self discipline. Reports she rarely is hungry. Multiple deaths last year contributing to weight gain.  Baseline weight: 254 lbs;  Current meal patterns: breakfast: sometimes skips, sometimes oatmeal; lunch: sandwich, meat and cheese, tuna and crackers; sometimes eats out; really more of whatever is in the fridge; dinner: not a  lot of vegetables. Sometimes leftovers from lunch; snacks: dry cereal - corn chex, sometimes cookies; drinks: water, green tea, hot lemonade Current exercise: 3 days a week; cardio and  strength training Extensive dietary counseling including education on focus on lean proteins, fruits and vegetables, whole grains and increased fiber consumption, adequate hydration Extensive exercise counseling including eventual goal of 150 minutes of moderate intensity exercise weekly Counseled on GLP1 agonists, including mechanism of action, side effects, and benefits. No personal or family history of medullary thyroid cancer, personal history of pancreatitis, and her gallbladder has been removed. Counseled on potential side effects of nausea, stomach upset, queasiness, constipation, and that these generally improve over time. Advised to contact our office with more severe symptoms, including nausea, diarrhea, stomach pain. Patient verbalized understanding. Start Ozempic 0.25 mg weekly for 4 weeks then increase to 0.5 mg weekly thereafter. Scheduled for nurse visit for teaching and will provide a sample. Patient does meet income criteria for assistance, so will pursue patient assistance if patient tolerates.   Hyperlipidemia: Improved; current treatment: rosuvastatin 5 mg twice weekly; Denies any intolerability concerns  Recommended to continue current regimen at this time  Supplements: Vitamin D 1000 units daily - though reports she takes maybe 3-4 days per week   Patient Goals/Self-Care Activities Over the next 90 days, patient will:  - take medications as prescribed target a minimum of 150 minutes of moderate intensity exercise weekly engage in dietary modifications by moderating carbohydrate portion sizes  Follow Up Plan: Telephone follow up appointment with care management team member scheduled for: ~ 4 weeks

## 2020-12-28 NOTE — Telephone Encounter (Signed)
Medication Samples have been labeled and logged for the patient. She will pick up at her nurse visit next week.  Drug name: Ozempic       Strength: 2 mg/1.5 mL        Qty: 1 pen  LOT: [id number]  Exp.Date: 05/09/23  Dosing instructions: Inject 0.25 mg weekly for 4 weeks then increase to 0.5 mg weekly   The patient has been instructed regarding the correct time, dose, and frequency of taking this medication, including desired effects and most common side effects.   Nicole Hardy 3:15 PM 12/28/2020

## 2020-12-28 NOTE — Chronic Care Management (AMB) (Signed)
Chronic Care Management Pharmacy Note  12/28/2020 Name:  Nicole Hardy MRN:  [id number] DOB:  1950-05-19   Subjective: Nicole Hardy is an 70 y.o. year old female who is a primary patient of Einar Pheasant, MD.  The CCM team was consulted for assistance with disease management and care coordination needs.    Engaged with patient by telephone for initial visit in response to provider referral for pharmacy case management and/or care coordination services.   Consent to Services:  The patient was given the following information about Chronic Care Management services today, agreed to services, and gave verbal consent: 1. CCM service includes personalized support from designated clinical staff supervised by the primary care provider, including individualized plan of care and coordination with other care providers 2. 24/7 contact phone numbers for assistance for urgent and routine care needs. 3. Service will only be billed when office clinical staff spend 20 minutes or more in a month to coordinate care. 4. Only one practitioner may furnish and bill the service in a calendar month. 5.The patient may stop CCM services at any time (effective at the end of the month) by phone call to the office staff. 6. The patient will be responsible for cost sharing (co-pay) of up to 20% of the service fee (after annual deductible is met). Patient agreed to services and consent obtained.  Patient Care Team: Einar Pheasant, MD as PCP - General (Internal Medicine) Robert Bellow, MD as Consulting Physician (General Surgery) Einar Pheasant, MD as Referring Physician (Internal Medicine) De Hollingshead, RPH-CPP (Pharmacist)    Objective:  Lab Results  Component Value Date   CREATININE 0.75 12/01/2020   CREATININE 0.82 07/13/2020   CREATININE 0.74 03/22/2020    Lab Results  Component Value Date   HGBA1C 5.8 12/01/2020   Last diabetic Eye exam: No results found for: HMDIABEYEEXA  Last diabetic Foot  exam: No results found for: HMDIABFOOTEX      Component Value Date/Time   CHOL 151 12/01/2020 0903   CHOL 196 10/30/2017 0807   TRIG 90.0 12/01/2020 0903   HDL 36.00 (L) 12/01/2020 0903   HDL 47 10/30/2017 0807   CHOLHDL 4 12/01/2020 0903   VLDL 18.0 12/01/2020 0903   LDLCALC 97 12/01/2020 0903   LDLCALC 132 (H) 10/30/2017 0807    Hepatic Function Latest Ref Rng & Units 12/01/2020 08/28/2020 07/13/2020  Total Protein 6.0 - 8.3 g/dL 6.9 7.1 7.1  Albumin 3.5 - 5.2 g/dL 3.7 3.8 3.9  AST 0 - 37 U/L $Remo'18 15 17  'mcsZs$ ALT 0 - 35 U/L $Remo'16 14 17  'ulSbP$ Alk Phosphatase 39 - 117 U/L 72 70 72  Total Bilirubin 0.2 - 1.2 mg/dL 0.8 0.8 0.7  Bilirubin, Direct 0.0 - 0.3 mg/dL 0.2 0.1 0.1    Lab Results  Component Value Date/Time   TSH 2.42 07/13/2020 09:03 AM   TSH 1.85 12/10/2019 09:56 AM    CBC Latest Ref Rng & Units 07/13/2020 03/24/2020 12/10/2019  WBC 4.0 - 10.5 K/uL 4.7 5.4 4.9  Hemoglobin 12.0 - 15.0 g/dL 12.9 12.7 13.3  Hematocrit 36.0 - 46.0 % 39.4 38.7 40.5  Platelets 150.0 - 400.0 K/uL 267.0 259.0 251.0    Lab Results  Component Value Date/Time   VD25OH 22.33 (L) 03/22/2020 09:21 AM   VD25OH 29.33 (L) 06/11/2019 09:51 AM     Social History   Tobacco Use  Smoking Status Never  Smokeless Tobacco Never   BP Readings from Last 3 Encounters:  12/05/20 128/78  07/17/20 130/80  03/24/20 122/72   Pulse Readings from Last 3 Encounters:  12/05/20 78  07/17/20 78  03/24/20 77   Wt Readings from Last 3 Encounters:  12/05/20 254 lb (115.2 kg)  08/04/20 252 lb (114.3 kg)  07/17/20 252 lb (114.3 kg)    Assessment: Review of patient past medical history, allergies, medications, health status, including review of consultants reports, laboratory and other test data, was performed as part of comprehensive evaluation and provision of chronic care management services.   SDOH:  (Social Determinants of Health) assessments and interventions performed:  SDOH Interventions    Flowsheet Row Most  Recent Value  SDOH Interventions   Physical Activity Interventions Intervention Not Indicated       CCM Care Plan  Allergies  Allergen Reactions   Metronidazole Other (See Comments)    Causes thrush in mouth    Medications Reviewed Today     Reviewed by De Hollingshead, RPH-CPP (Pharmacist) on 12/28/20 at 3  Med List Status: <None>   Medication Order Taking? Sig Documenting Provider Last Dose Status Informant  rosuvastatin (CRESTOR) 5 MG tablet [id number] Yes Take 1 tablet 2x/week. Einar Pheasant, MD Taking Active   Vitamin D, Cholecalciferol, 25 MCG (1000 UT) TABS [id number] Yes Take 1,000 Units by mouth daily. [provider] Taking Active             Patient Active Problem List   Diagnosis Date Noted   Increased appetite 12/10/2020   Back pain 07/17/2020   Head pain 03/24/2020   Vision changes 12/26/2019   Right buttock pain 12/26/2019   Hyperglycemia 06/20/2019   Stress 12/14/2018   Dizziness 06/09/2018   Vaginal prolapse 05/25/2017   Foot pain 07/06/2016   Left hip pain 06/12/2014   Health care maintenance 06/12/2014   Thrombophlebitis 04/21/2013   Hypercholesterolemia 03/25/2012   Osteopenia 03/25/2012   Vitamin D deficiency 03/25/2012   Nephrolithiasis 03/25/2012    Immunization History  Administered Date(s) Administered   PFIZER(Purple Top)SARS-COV-2 Vaccination 08/03/2019, 08/23/2019, 04/03/2020   PPD Test 12/25/2015, 11/20/2016    Conditions to be addressed/monitored: HLD and Obesity  Care Plan : Medication Management  Updates made by De Hollingshead, RPH-CPP since 12/28/2020 12:00 AM     Problem: Obesity, Prediabetes, Hyperlipidemia      Long-Range Goal: Disease Progression Prevention   Start Date: 12/28/2020  This Visit's Progress: On track  Priority: High  Note:   Current Barriers:  Unable to independently improve weight through diet and lifestyle modifications  Pharmacist Clinical Goal(s):  Over the next 90  days, patient will achieve 5-10% weight loss from baseline through collaboration with PharmD and provider.   Interventions: 1:1 collaboration with Einar Pheasant, MD regarding development and update of comprehensive plan of care as evidenced by provider attestation and co-signature Inter-disciplinary care team collaboration (see longitudinal plan of care) Comprehensive medication review performed; medication list updated in electronic medical record  Overweight/Obesity Complicated by Hyperlipidemia: Unable to achieve goal weight loss through lifestyle modification alone. Reports her biggest struggle is self discipline. Reports she rarely is hungry. Multiple deaths last year contributing to weight gain.  Baseline weight: 254 lbs;  Current meal patterns: breakfast: sometimes skips, sometimes oatmeal; lunch: sandwich, meat and cheese, tuna and crackers; sometimes eats out; really more of whatever is in the fridge; dinner: not a lot of vegetables. Sometimes leftovers from lunch; snacks: dry cereal - corn chex, sometimes cookies; drinks: water, green tea, hot lemonade Current exercise: 3 days a week; cardio and strength  training Extensive dietary counseling including education on focus on lean proteins, fruits and vegetables, whole grains and increased fiber consumption, adequate hydration Extensive exercise counseling including eventual goal of 150 minutes of moderate intensity exercise weekly Counseled on GLP1 agonists, including mechanism of action, side effects, and benefits. No personal or family history of medullary thyroid cancer, personal history of pancreatitis, and her gallbladder has been removed. Counseled on potential side effects of nausea, stomach upset, queasiness, constipation, and that these generally improve over time. Advised to contact our office with more severe symptoms, including nausea, diarrhea, stomach pain. Patient verbalized understanding. Start Ozempic 0.25 mg weekly for 4  weeks then increase to 0.5 mg weekly thereafter. Scheduled for nurse visit for teaching and will provide a sample. Patient does meet income criteria for assistance, so will pursue patient assistance if patient tolerates.   Hyperlipidemia: Improved; current treatment: rosuvastatin 5 mg twice weekly; Denies any intolerability concerns  Recommended to continue current regimen at this time  Supplements: Vitamin D 1000 units daily - though reports she takes maybe 3-4 days per week   Patient Goals/Self-Care Activities Over the next 90 days, patient will:  - take medications as prescribed target a minimum of 150 minutes of moderate intensity exercise weekly engage in dietary modifications by moderating carbohydrate portion sizes  Follow Up Plan: Telephone follow up appointment with care management team member scheduled for: ~ 4 weeks      Medication Assistance: None required.  Patient affirms current coverage meets needs.  Patient's preferred pharmacy is:  [facility] [address] ([address]), [place] - [address] [place]) [place] [address] Phone: [phone] Fax: [phone]   Follow Up:  Patient agrees to Care Plan and Follow-up.  Plan: Telephone follow up appointment with care management team member scheduled for:  ~ 4 weeks  Catie Darnelle Maffucci, PharmD, [place], [place] Clinical Pharmacist [employer] at [employer] [phone]

## 2020-12-29 ENCOUNTER — Other Ambulatory Visit: Payer: Self-pay | Admitting: Internal Medicine

## 2021-01-04 ENCOUNTER — Ambulatory Visit: Payer: Medicare HMO

## 2021-01-05 ENCOUNTER — Ambulatory Visit: Payer: Medicare HMO | Admitting: Pharmacist

## 2021-01-05 DIAGNOSIS — E7800 Pure hypercholesterolemia, unspecified: Secondary | ICD-10-CM | POA: Diagnosis not present

## 2021-01-05 DIAGNOSIS — M8580 Other specified disorders of bone density and structure, unspecified site: Secondary | ICD-10-CM

## 2021-01-05 DIAGNOSIS — R739 Hyperglycemia, unspecified: Secondary | ICD-10-CM

## 2021-01-05 NOTE — Chronic Care Management (AMB) (Signed)
Chronic Care Management Pharmacy Note  01/05/2021 Name:  Nicole Hardy MRN:  [id number] DOB:  Sep 26, 1950   Subjective: Nicole Hardy is an 70 y.o. year old female who is a primary patient of Einar Pheasant, MD.  The CCM team was consulted for assistance with disease management and care coordination needs.    Engaged with patient by telephone for  medication management questions  in response to provider referral for pharmacy case management and/or care coordination services.   Consent to Services:  The patient was given information about Chronic Care Management services, agreed to services, and gave verbal consent prior to initiation of services.  Please see initial visit note for detailed documentation.   Patient Care Team: Einar Pheasant, MD as PCP - General (Internal Medicine) Robert Bellow, MD as Consulting Physician (General Surgery) Einar Pheasant, MD as Referring Physician (Internal Medicine) De Hollingshead, RPH-CPP (Pharmacist)   Objective:  Lab Results  Component Value Date   CREATININE 0.75 12/01/2020   CREATININE 0.82 07/13/2020   CREATININE 0.74 03/22/2020    Lab Results  Component Value Date   HGBA1C 5.8 12/01/2020   Last diabetic Eye exam: No results found for: HMDIABEYEEXA  Last diabetic Foot exam: No results found for: HMDIABFOOTEX      Component Value Date/Time   CHOL 151 12/01/2020 0903   CHOL 196 10/30/2017 0807   TRIG 90.0 12/01/2020 0903   HDL 36.00 (L) 12/01/2020 0903   HDL 47 10/30/2017 0807   CHOLHDL 4 12/01/2020 0903   VLDL 18.0 12/01/2020 0903   LDLCALC 97 12/01/2020 0903   LDLCALC 132 (H) 10/30/2017 0807    Hepatic Function Latest Ref Rng & Units 12/01/2020 08/28/2020 07/13/2020  Total Protein 6.0 - 8.3 g/dL 6.9 7.1 7.1  Albumin 3.5 - 5.2 g/dL 3.7 3.8 3.9  AST 0 - 37 U/L $Remo'18 15 17  'JnLdZ$ ALT 0 - 35 U/L $Remo'16 14 17  'OPdFU$ Alk Phosphatase 39 - 117 U/L 72 70 72  Total Bilirubin 0.2 - 1.2 mg/dL 0.8 0.8 0.7  Bilirubin, Direct 0.0 - 0.3 mg/dL 0.2  0.1 0.1    Lab Results  Component Value Date/Time   TSH 2.42 07/13/2020 09:03 AM   TSH 1.85 12/10/2019 09:56 AM    CBC Latest Ref Rng & Units 07/13/2020 03/24/2020 12/10/2019  WBC 4.0 - 10.5 K/uL 4.7 5.4 4.9  Hemoglobin 12.0 - 15.0 g/dL 12.9 12.7 13.3  Hematocrit 36.0 - 46.0 % 39.4 38.7 40.5  Platelets 150.0 - 400.0 K/uL 267.0 259.0 251.0    Lab Results  Component Value Date/Time   VD25OH 22.33 (L) 03/22/2020 09:21 AM   VD25OH 29.33 (L) 06/11/2019 09:51 AM     Social History   Tobacco Use  Smoking Status Never  Smokeless Tobacco Never   BP Readings from Last 3 Encounters:  12/05/20 128/78  07/17/20 130/80  03/24/20 122/72   Pulse Readings from Last 3 Encounters:  12/05/20 78  07/17/20 78  03/24/20 77   Wt Readings from Last 3 Encounters:  12/05/20 254 lb (115.2 kg)  08/04/20 252 lb (114.3 kg)  07/17/20 252 lb (114.3 kg)    Assessment: Review of patient past medical history, allergies, medications, health status, including review of consultants reports, laboratory and other test data, was performed as part of comprehensive evaluation and provision of chronic care management services.   SDOH:  (Social Determinants of Health) assessments and interventions performed:  SDOH Interventions    Flowsheet Row Most Recent Value  SDOH Interventions  Financial Strain Interventions Intervention Not Indicated       CCM Care Plan  Allergies  Allergen Reactions   Metronidazole Other (See Comments)    Causes thrush in mouth    Medications Reviewed Today     Reviewed by De Hollingshead, RPH-CPP (Pharmacist) on 12/28/20 at 43  Med List Status: <None>   Medication Order Taking? Sig Documenting Provider Last Dose Status Informant  rosuvastatin (CRESTOR) 5 MG tablet [id number] Yes Take 1 tablet 2x/week. Einar Pheasant, MD Taking Active   Vitamin D, Cholecalciferol, 25 MCG (1000 UT) TABS [id number] Yes Take 1,000 Units by mouth daily. [provider] Taking  Active             Patient Active Problem List   Diagnosis Date Noted   Increased appetite 12/10/2020   Back pain 07/17/2020   Head pain 03/24/2020   Vision changes 12/26/2019   Right buttock pain 12/26/2019   Hyperglycemia 06/20/2019   Stress 12/14/2018   Dizziness 06/09/2018   Vaginal prolapse 05/25/2017   Foot pain 07/06/2016   Left hip pain 06/12/2014   Health care maintenance 06/12/2014   Thrombophlebitis 04/21/2013   Hypercholesterolemia 03/25/2012   Osteopenia 03/25/2012   Vitamin D deficiency 03/25/2012   Nephrolithiasis 03/25/2012    Immunization History  Administered Date(s) Administered   PFIZER(Purple Top)SARS-COV-2 Vaccination 08/03/2019, 08/23/2019, 04/03/2020   PPD Test 12/25/2015, 11/20/2016    Conditions to be addressed/monitored: HLD and Obesity  Care Plan : Medication Management  Updates made by De Hollingshead, RPH-CPP since 01/05/2021 12:00 AM     Problem: Obesity, Prediabetes, Hyperlipidemia      Long-Range Goal: Disease Progression Prevention   Start Date: 12/28/2020  Recent Progress: On track  Priority: High  Note:   Current Barriers:  Unable to independently improve weight through diet and lifestyle modifications  Pharmacist Clinical Goal(s):  Over the next 90 days, patient will achieve 5-10% weight loss from baseline through collaboration with PharmD and provider.   Interventions: 1:1 collaboration with Einar Pheasant, MD regarding development and update of comprehensive plan of care as evidenced by provider attestation and co-signature Inter-disciplinary care team collaboration (see longitudinal plan of care) Comprehensive medication review performed; medication list updated in electronic medical record  Overweight/Obesity Complicated by Hyperlipidemia: Unable to achieve goal weight loss through lifestyle modification alone. Reports her biggest struggle is self discipline. Reports she rarely is hungry. Multiple deaths last  year contributing to weight gain.  Baseline weight: 254 lbs;  Patient canceled nurse visit for injection demonstration and to pick up sample. She and her daughter have multiple concerns including insurance/medical ramifications of using a "diabetes" medication, long term effects on end organs, data in elderly patients, side effects and complications. Reviewed that we could use samples and then transition to patient assistance. Reviewed long term safety data in a diabetic population of her age that can be extrapolated to weight management indications. Again reviewed GI side effects. Patient elects to continue to think about medication for weight management   Hyperlipidemia: Improved; current treatment: rosuvastatin 5 mg twice weekly; Denies any intolerability concerns  Previously recommended to continue current regimen at this time  Supplements: Vitamin D 1000 units daily - though reports she takes maybe 3-4 days per week   Patient Goals/Self-Care Activities Over the next 90 days, patient will:  - take medications as prescribed target a minimum of 150 minutes of moderate intensity exercise weekly engage in dietary modifications by moderating carbohydrate portion sizes  Follow Up Plan: Telephone follow  up appointment with care management team member scheduled for: ~ 2 weeks      Medication Assistance: None required.  Patient affirms current coverage meets needs.  Patient's preferred pharmacy is:  [facility] [address] ([address]), [place] [address] [place] ([place]) [place] [address] Phone: [phone] Fax: [phone]   Follow Up:  Patient agrees to Care Plan and Follow-up.  Plan: Telephone follow up appointment with care management team member scheduled for:  ~2 weeks  Catie Darnelle Maffucci, PharmD, [place], [place] Clinical Pharmacist [employer] at [employer] [phone]

## 2021-01-05 NOTE — Patient Instructions (Signed)
Visit Information  PATIENT GOALS:  Goals Addressed               This Visit's Progress     Patient Stated     Weight Loss (pt-stated)        Patient Goals/Self-Care Activities Over the next 90 days, patient will:  - take medications as prescribed target a minimum of 150 minutes of moderate intensity exercise weekly engage in dietary modifications by moderating carbohydrate portion sizes         Patient verbalizes understanding of instructions provided today and agrees to view in MyChart.  Plan: Telephone follow up appointment with care management team member scheduled for:  ~2 weeks  Catie Feliz Beam, PharmD, [place], CPP Clinical Pharmacist [employer] at [employer] [phone]

## 2021-01-24 ENCOUNTER — Ambulatory Visit: Payer: Medicare HMO | Admitting: Pharmacist

## 2021-01-24 DIAGNOSIS — E7800 Pure hypercholesterolemia, unspecified: Secondary | ICD-10-CM

## 2021-01-24 DIAGNOSIS — R739 Hyperglycemia, unspecified: Secondary | ICD-10-CM

## 2021-01-24 NOTE — Patient Instructions (Signed)
Visit Information  PATIENT GOALS:  Goals Addressed               This Visit's Progress     Patient Stated     Weight Loss (pt-stated)        Patient Goals/Self-Care Activities Over the next 90 days, patient will:  - take medications as prescribed target a minimum of 150 minutes of moderate intensity exercise weekly engage in dietary modifications by moderating carbohydrate portion sizes        Patient verbalizes understanding of instructions provided today and agrees to view in MyChart.   Plan: Patient will outreach with any future questions or concerns  Catie Feliz Beam, PharmD, [place], CPP Clinical Pharmacist [employer] at [employer] [phone]

## 2021-01-24 NOTE — Chronic Care Management (AMB) (Signed)
Chronic Care Management Pharmacy Note  01/24/2021 Name:  Nicole Hardy MRN:  [id number] DOB:  [birth year]   Subjective: Nicole Hardy is an 70 y.o. year old female who is a primary patient of Einar Pheasant, MD.  The CCM team was consulted for assistance with disease management and care coordination needs.    Engaged with patient by telephone for follow up visit in response to provider referral for pharmacy case management and/or care coordination services.   Consent to Services:  The patient was given information about Chronic Care Management services, agreed to services, and gave verbal consent prior to initiation of services.  Please see initial visit note for detailed documentation.   Patient Care Team: Einar Pheasant, MD as PCP - General (Internal Medicine) Robert Bellow, MD as Consulting Physician (General Surgery) Einar Pheasant, MD as Referring Physician (Internal Medicine) De Hollingshead, RPH-CPP (Pharmacist)  Objective:  Lab Results  Component Value Date   CREATININE 0.75 12/01/2020   CREATININE 0.82 07/13/2020   CREATININE 0.74 03/22/2020    Lab Results  Component Value Date   HGBA1C 5.8 12/01/2020   Last diabetic Eye exam: No results found for: HMDIABEYEEXA  Last diabetic Foot exam: No results found for: HMDIABFOOTEX      Component Value Date/Time   CHOL 151 12/01/2020 0903   CHOL 196 10/30/2017 0807   TRIG 90.0 12/01/2020 0903   HDL 36.00 (L) 12/01/2020 0903   HDL 47 10/30/2017 0807   CHOLHDL 4 12/01/2020 0903   VLDL 18.0 12/01/2020 0903   LDLCALC 97 12/01/2020 0903   LDLCALC 132 (H) 10/30/2017 0807    Hepatic Function Latest Ref Rng & Units 12/01/2020 08/28/2020 07/13/2020  Total Protein 6.0 - 8.3 g/dL 6.9 7.1 7.1  Albumin 3.5 - 5.2 g/dL 3.7 3.8 3.9  AST 0 - 37 U/L $Remo'18 15 17  'MytlX$ ALT 0 - 35 U/L $Remo'16 14 17  'pOeqS$ Alk Phosphatase 39 - 117 U/L 72 70 72  Total Bilirubin 0.2 - 1.2 mg/dL 0.8 0.8 0.7  Bilirubin, Direct 0.0 - 0.3 mg/dL 0.2 0.1 0.1    Lab  Results  Component Value Date/Time   TSH 2.42 07/13/2020 09:03 AM   TSH 1.85 12/10/2019 09:56 AM    CBC Latest Ref Rng & Units 07/13/2020 03/24/2020 12/10/2019  WBC 4.0 - 10.5 K/uL 4.7 5.4 4.9  Hemoglobin 12.0 - 15.0 g/dL 12.9 12.7 13.3  Hematocrit 36.0 - 46.0 % 39.4 38.7 40.5  Platelets 150.0 - 400.0 K/uL 267.0 259.0 251.0    Lab Results  Component Value Date/Time   VD25OH 22.33 (L) 03/22/2020 09:21 AM   VD25OH 29.33 (L) 06/11/2019 09:51 AM     Social History   Tobacco Use  Smoking Status Never  Smokeless Tobacco Never   BP Readings from Last 3 Encounters:  12/05/20 128/78  07/17/20 130/80  03/24/20 122/72   Pulse Readings from Last 3 Encounters:  12/05/20 78  07/17/20 78  03/24/20 77   Wt Readings from Last 3 Encounters:  12/05/20 254 lb (115.2 kg)  08/04/20 252 lb (114.3 kg)  07/17/20 252 lb (114.3 kg)    Assessment: Review of patient past medical history, allergies, medications, health status, including review of consultants reports, laboratory and other test data, was performed as part of comprehensive evaluation and provision of chronic care management services.   SDOH:  (Social Determinants of Health) assessments and interventions performed:  SDOH Interventions    Flowsheet Row Most Recent Value  SDOH Interventions   Financial Strain Interventions  Intervention Not Indicated       CCM Care Plan  Allergies  Allergen Reactions   Metronidazole Other (See Comments)    Causes thrush in mouth    Medications Reviewed Today     Reviewed by De Hollingshead, RPH-CPP (Pharmacist) on 12/28/20 at 22  Med List Status: <None>   Medication Order Taking? Sig Documenting Provider Last Dose Status Informant  rosuvastatin (CRESTOR) 5 MG tablet [id number] Yes Take 1 tablet 2x/week. Einar Pheasant, MD Taking Active   Vitamin D, Cholecalciferol, 25 MCG (1000 UT) TABS [id number] Yes Take 1,000 Units by mouth daily. [provider] Taking Active              Patient Active Problem List   Diagnosis Date Noted   Increased appetite 12/10/2020   Back pain 07/17/2020   Head pain 03/24/2020   Vision changes 12/26/2019   Right buttock pain 12/26/2019   Hyperglycemia 06/20/2019   Stress 12/14/2018   Dizziness 06/09/2018   Vaginal prolapse 05/25/2017   Foot pain 07/06/2016   Left hip pain 06/12/2014   Health care maintenance 06/12/2014   Thrombophlebitis 04/21/2013   Hypercholesterolemia 03/25/2012   Osteopenia 03/25/2012   Vitamin D deficiency 03/25/2012   Nephrolithiasis 03/25/2012    Immunization History  Administered Date(s) Administered   PFIZER(Purple Top)SARS-COV-2 Vaccination 08/03/2019, 08/23/2019, 04/03/2020   PPD Test 12/25/2015, 11/20/2016    Conditions to be addressed/monitored: HLD and Obesity  Care Plan : Medication Management  Updates made by De Hollingshead, RPH-CPP since 01/24/2021 12:00 AM  Completed 01/24/2021   Problem: Obesity, Prediabetes, Hyperlipidemia Resolved 01/24/2021     Long-Range Goal: Disease Progression Prevention Completed 01/24/2021  Start Date: 12/28/2020  Recent Progress: On track  Priority: High  Note:   Current Barriers:  Unable to independently improve weight through diet and lifestyle modifications  Pharmacist Clinical Goal(s):  Over the next 90 days, patient will achieve 5-10% weight loss from baseline through collaboration with PharmD and provider.   Interventions: 1:1 collaboration with Einar Pheasant, MD regarding development and update of comprehensive plan of care as evidenced by provider attestation and co-signature Inter-disciplinary care team collaboration (see longitudinal plan of care) Comprehensive medication review performed; medication list updated in electronic medical record  Overweight/Obesity Complicated by Hyperlipidemia: Unable to achieve goal weight loss through lifestyle modification alone.  Baseline weight: 254 lbs;  Today reports that she is  not interested in [place] for weight management today. Worried about injectables and side effects. Not interested in therapy at this time.   Hyperlipidemia: Improved; current treatment: rosuvastatin 5 mg twice weekly; Denies any intolerability concerns  Previously recommended to continue current regimen at this time  Supplements: Vitamin D 1000 units daily - though reports she takes maybe 3-4 days per week   Patient Goals/Self-Care Activities Over the next 90 days, patient will:  - take medications as prescribed target a minimum of 150 minutes of moderate intensity exercise weekly engage in dietary modifications by moderating carbohydrate portion sizes  Follow Up Plan: Closing CCM case      Medication Assistance: None required.  Patient affirms current coverage meets needs.  Patient's preferred pharmacy is:  [facility] [address] ([address]), [place] [address] [place] [address] Phone: [phone] Fax: [phone]  Follow Up:  Patient requests no follow-up at this time.  Plan: Patient will outreach with any future questions or concerns  Catie Darnelle Maffucci, PharmD, [place], [place] Clinical Pharmacist [employer] at [place]

## 2021-02-12 ENCOUNTER — Telehealth: Payer: Self-pay | Admitting: Internal Medicine

## 2021-02-12 NOTE — Telephone Encounter (Signed)
Pt called in regarding her appointment on 03/07/2021. Pt stated since she denied the Ozempic injection medication, what would be the reason for the 3 months follow up appt for 03/07/2021. Pt stated if the appt was about the medication do she need the follow up. Pt would like call back at [phone]

## 2021-02-12 NOTE — Telephone Encounter (Signed)
Patient was advised to come back for 3 month follow up at her last visit in August. She was referred to [facility] for hyperglycemia and hyperlipidemia. Declined ozempic and did not change therapy for statin. Closed CCM case. Do you want her to keep her appt in Nov or are you ok with pushing appt out?

## 2021-02-12 NOTE — Telephone Encounter (Signed)
She does not have to keep this appt if she desires to change, but I would like a f/u in the next 1-2 months (to f/u on her other issues).

## 2021-02-13 NOTE — Telephone Encounter (Signed)
While discussing with patient, call was disconnected. If returns call- ok to reschedule 11/30 appt for after the first of the year.

## 2021-02-13 NOTE — Telephone Encounter (Signed)
Pt called to discuss what Dr.Scott was wanting to discuss at visit. Dr. Lorin Picket wants to follow up with pt about any other issues so pt probably wants to keep appointment. Pt also wants to know if she needs to schedule labs prior to appointment?

## 2021-02-14 NOTE — Telephone Encounter (Signed)
Spoke with patient. Advised that it is not time for labs yet, but we could move her appt out a couple of weeks and do labs prior to appt. Patient agreed and confirmed that she was doing good. Scheduled for labs and a follow up in Dec. Canceled Nov appt.

## 2021-03-07 ENCOUNTER — Ambulatory Visit: Payer: Medicare HMO | Admitting: Internal Medicine

## 2021-03-23 ENCOUNTER — Other Ambulatory Visit (INDEPENDENT_AMBULATORY_CARE_PROVIDER_SITE_OTHER): Payer: Medicare HMO

## 2021-03-23 ENCOUNTER — Other Ambulatory Visit: Payer: Self-pay

## 2021-03-23 DIAGNOSIS — E7800 Pure hypercholesterolemia, unspecified: Secondary | ICD-10-CM | POA: Diagnosis not present

## 2021-03-23 DIAGNOSIS — R739 Hyperglycemia, unspecified: Secondary | ICD-10-CM

## 2021-03-23 LAB — LIPID PANEL
Cholesterol: 165 mg/dL (ref 0–200)
HDL: 45 mg/dL (ref >39.00)
LDL Cholesterol: 108 mg/dL — ABNORMAL HIGH (ref 0–99)
NonHDL: 120.24
Total CHOL/HDL Ratio: 4
Triglycerides: 62 mg/dL (ref 0.0–149.0)
VLDL: 12.4 mg/dL (ref 0.0–40.0)

## 2021-03-23 LAB — BASIC METABOLIC PANEL
BUN: 10 mg/dL (ref 6–23)
CO2: 30 mEq/L (ref 19–32)
Calcium: 9.5 mg/dL (ref 8.4–10.5)
Chloride: 107 mEq/L (ref 96–112)
Creatinine, Ser: 0.71 mg/dL (ref 0.40–1.20)
GFR: 87.14 mL/min (ref >60.00)
Glucose, Bld: 99 mg/dL (ref 70–99)
Potassium: 4 mEq/L (ref 3.5–5.1)
Sodium: 143 mEq/L (ref 135–145)

## 2021-03-23 LAB — HEMOGLOBIN A1C: Hgb A1c MFr Bld: 5.9 % (ref 4.6–6.5)

## 2021-03-23 LAB — HEPATIC FUNCTION PANEL
ALT: 15 U/L (ref 0–35)
AST: 17 U/L (ref 0–37)
Albumin: 3.8 g/dL (ref 3.5–5.2)
Alkaline Phosphatase: 75 U/L (ref 39–117)
Bilirubin, Direct: 0.1 mg/dL (ref 0.0–0.3)
Total Bilirubin: 0.7 mg/dL (ref 0.2–1.2)
Total Protein: 7.6 g/dL (ref 6.0–8.3)

## 2021-03-27 ENCOUNTER — Ambulatory Visit (INDEPENDENT_AMBULATORY_CARE_PROVIDER_SITE_OTHER): Payer: Medicare HMO | Admitting: Internal Medicine

## 2021-03-27 ENCOUNTER — Other Ambulatory Visit: Payer: Self-pay

## 2021-03-27 VITALS — BP 124/76 | HR 77 | Temp 97.9°F | Resp 16 | Ht 66.0 in | Wt 251.2 lb

## 2021-03-27 DIAGNOSIS — F439 Reaction to severe stress, unspecified: Secondary | ICD-10-CM | POA: Diagnosis not present

## 2021-03-27 DIAGNOSIS — E7800 Pure hypercholesterolemia, unspecified: Secondary | ICD-10-CM

## 2021-03-27 DIAGNOSIS — R739 Hyperglycemia, unspecified: Secondary | ICD-10-CM

## 2021-03-27 DIAGNOSIS — N644 Mastodynia: Secondary | ICD-10-CM

## 2021-03-27 DIAGNOSIS — M79673 Pain in unspecified foot: Secondary | ICD-10-CM

## 2021-03-27 DIAGNOSIS — E559 Vitamin D deficiency, unspecified: Secondary | ICD-10-CM | POA: Diagnosis not present

## 2021-03-27 MED ORDER — ROSUVASTATIN CALCIUM 5 MG PO TABS: ORAL_TABLET | ORAL | 3 refills | Status: DC

## 2021-03-27 NOTE — Progress Notes (Signed)
Patient ID: Cyan Clippinger, female   DOB: Jan 19, 1951, 70 y.o.   MRN: [id number]   Subjective:    Patient ID: [name], female    DOB: 05-14-1950, 70 y.o.   MRN: [id number]  This visit occurred during the SARS-CoV-2 public health emergency.  Safety protocols were in place, including screening questions prior to the visit, additional usage of staff PPE, and extensive cleaning of exam room while observing appropriate contact time as indicated for disinfecting solutions.   Patient here for a scheduled follow up.   Chief Complaint  Patient presents with   Hyperlipidemia   .   HPI Got her covid injection 03/16/21.  Noticed left breast discomfort after her injection.  States has resolved now.  Has been to exercise - toning 2 days per week.  Stay active classess.  No chest pain or sob reported.  No increased cough or congestion.  No abdominal pain.  Miralax helping bowels.  Persistent foot pain. Has seen podiatry.     Past Medical History:  Diagnosis Date   Complication of anesthesia    Degenerative disc disease, cervical    cervical   Hypercholesterolemia    Meralgia paraesthetica    Migraines    Nephrolithiasis    Neurodermatitis    Obesity    Osteopenia    PONV (postoperative nausea and vomiting)    Vitamin D deficiency    Past Surgical History:  Procedure Laterality Date   CHOLECYSTECTOMY N/A 04/24/2015   Procedure: LAPAROSCOPIC CHOLECYSTECTOMY WITH INTRAOPERATIVE CHOLANGIOGRAM;  Surgeon: Robert Bellow, MD;  Location: [facility];  Service: General;  Laterality: N/A;   EXTRACORPOREAL SHOCK WAVE LITHOTRIPSY Right 11/17/2014   Procedure: EXTRACORPOREAL SHOCK WAVE LITHOTRIPSY (ESWL);  Surgeon: Royston Cowper, MD;  Location: [facility];  Service: Urology;  Laterality: Right;   EXTRACORPOREAL SHOCK WAVE LITHOTRIPSY Left 12/15/2014   Procedure: EXTRACORPOREAL SHOCK WAVE LITHOTRIPSY (ESWL);  Surgeon: Royston Cowper, MD;  Location: [facility];  Service: Urology;  Laterality: Left;   laparoscopic  surgery  1990   nephrolithiasis (Dr Ernst Spell)   LITHOTRIPSY  2010, 1980's   pessary  2003, 2016   Family History  Problem Relation Age of Onset   Hypertension Mother    Hypertension Father    Asthma Sister    Hypertension Sister    Diabetes Sister    Breast cancer Sister 87   Rectal cancer Sister    Social History   Socioeconomic History   Marital status: Widowed    Spouse name: Not on file   Number of children: 1   Years of education: Not on file   Highest education level: Not on file  Occupational History   Not on file  Tobacco Use   Smoking status: Never   Smokeless tobacco: Never  Vaping Use   Vaping Use: Never used  Substance and Sexual Activity   Alcohol use: No    Alcohol/week: 0.0 standard drinks   Drug use: No   Sexual activity: Not Currently    Birth control/protection: Surgical  Other Topics Concern   Not on file  Social History Narrative   Not on file   Social Determinants of Health   Financial Resource Strain: Low Risk    Difficulty of Paying Living Expenses: Not hard at all  Food Insecurity: No Food Insecurity   Worried About Charity fundraiser in the Last Year: Never true   [place] in the Last Year: Never true  Transportation Needs: No Transportation Needs  Lack of Transportation (Medical): No   Lack of Transportation (Non-Medical): No  Physical Activity: Sufficiently Active   Days of Exercise per Week: 3 days   Minutes of Exercise per Session: 50 min  Stress: No Stress Concern Present   Feeling of Stress : Not at all  Social Connections: Unknown   Frequency of Communication with Friends and Family: More than three times a week   Frequency of Social Gatherings with Friends and Family: Not on file   Attends Religious Services: Not on file   Active Member of Clubs or Organizations: Not on file   Attends Archivist Meetings: Not on file   Marital Status: Not on file     Review of Systems  Constitutional:  Negative for  appetite change and unexpected weight change.  HENT:  Negative for congestion and sinus pressure.   Respiratory:  Negative for cough, chest tightness and shortness of breath.   Cardiovascular:  Negative for chest pain and palpitations.  Gastrointestinal:  Negative for nausea and vomiting.  Genitourinary:  Negative for difficulty urinating, dysuria and menstrual problem.  Musculoskeletal:  Negative for joint swelling and myalgias.       Foot pain - bilateraol   Skin:  Negative for color change and rash.  Neurological:  Negative for dizziness, light-headedness and headaches.  Psychiatric/Behavioral:  Negative for agitation and dysphoric mood.       Objective:     BP 124/76    Pulse 77    Temp 97.9 F (36.6 C)    Resp 16    Ht $R'5\' 6"'dO$  (1.676 m)    Wt 251 lb 3.2 oz (113.9 kg)    LMP 09/10/2016 (Exact Date)    SpO2 97%    BMI 40.54 kg/m  Wt Readings from Last 3 Encounters:  03/27/21 251 lb 3.2 oz (113.9 kg)  12/05/20 254 lb (115.2 kg)  08/04/20 252 lb (114.3 kg)    Physical Exam Vitals reviewed.  Constitutional:      General: She is not in acute distress.    Appearance: Normal appearance.  HENT:     Head: Normocephalic and atraumatic.     Right Ear: External ear normal.     Left Ear: External ear normal.  Eyes:     General: No scleral icterus.       Right eye: No discharge.        Left eye: No discharge.     Conjunctiva/sclera: Conjunctivae normal.  Neck:     Thyroid: No thyromegaly.  Cardiovascular:     Rate and Rhythm: Normal rate and regular rhythm.  Pulmonary:     Effort: No respiratory distress.     Breath sounds: Normal breath sounds. No wheezing.  Abdominal:     General: Bowel sounds are normal.     Palpations: Abdomen is soft.     Tenderness: There is no abdominal tenderness.  Musculoskeletal:        General: No swelling or tenderness.     Cervical back: Neck supple. No tenderness.  Lymphadenopathy:     Cervical: No cervical adenopathy.  Skin:    Findings: No  erythema or rash.  Neurological:     Mental Status: She is alert.  Psychiatric:        Mood and Affect: Mood normal.        Behavior: Behavior normal.     Outpatient Encounter Medications as of 03/27/2021  Medication Sig   Vitamin D, Cholecalciferol, 25 MCG (1000 UT) TABS Take 1,000  Units by mouth daily.   [DISCONTINUED] rosuvastatin (CRESTOR) 5 MG tablet TAKE 1 TABLET BY MOUTH TWICE A WEEK   rosuvastatin (CRESTOR) 5 MG tablet TAKE 1 TABLET BY MOUTH THREE DAYS PER WEEK   [DISCONTINUED] Semaglutide,0.25 or 0.5MG /DOS, (OZEMPIC, 0.25 OR 0.5 MG/DOSE,) 2 MG/1.5ML SOPN Inject 0.25 mg weekly for 4 weeks, then increase to 0.5 mg weekly   No facility-administered encounter medications on file as of 03/27/2021.     Lab Results  Component Value Date   WBC 4.7 07/13/2020   HGB 12.9 07/13/2020   HCT 39.4 07/13/2020   PLT 267.0 07/13/2020   GLUCOSE 99 03/23/2021   CHOL 165 03/23/2021   TRIG 62.0 03/23/2021   HDL 45.00 03/23/2021   LDLCALC 108 (H) 03/23/2021   ALT 15 03/23/2021   AST 17 03/23/2021   NA 143 03/23/2021   K 4.0 03/23/2021   CL 107 03/23/2021   CREATININE 0.71 03/23/2021   BUN 10 03/23/2021   CO2 30 03/23/2021   TSH 2.42 07/13/2020   HGBA1C 5.9 03/23/2021    MM 3D SCREEN BREAST BILATERAL  Result Date: 06/21/2020 CLINICAL DATA:  Screening. EXAM: DIGITAL SCREENING BILATERAL MAMMOGRAM WITH TOMOSYNTHESIS AND CAD TECHNIQUE: Bilateral screening digital craniocaudal and mediolateral oblique mammograms were obtained. Bilateral screening digital breast tomosynthesis was performed. The images were evaluated with computer-aided detection. COMPARISON:  Previous exam(s). ACR Breast Density Category b: There are scattered areas of fibroglandular density. FINDINGS: There are no findings suspicious for malignancy. The images were evaluated with computer-aided detection. IMPRESSION: No mammographic evidence of malignancy. A result letter of this screening mammogram will be mailed directly to  the patient. RECOMMENDATION: Screening mammogram in one year. (Code:SM-B-01Y) BI-RADS CATEGORY  1: Negative. Electronically Signed   By: Franki Cabot M.D.   On: 06/21/2020 10:28       Assessment & Plan:   Problem List Items Addressed This Visit     Breast pain    Resolved.  Follow.       Foot pain    Persistent.  Has seen podiatry previously.  Discussed f/u.  Will notify me when agreeable.        Hypercholesterolemia - Primary    On crestor.  Tolerating.  Cholesterol improved.  Low cholesterol diet and exercise.  Follow lipid panel and liver function tests.   Lab Results  Component Value Date   CHOL 165 03/23/2021   HDL 45.00 03/23/2021   LDLCALC 108 (H) 03/23/2021   TRIG 62.0 03/23/2021   CHOLHDL 4 03/23/2021       Relevant Medications   rosuvastatin (CRESTOR) 5 MG tablet   Other Relevant Orders   Lipid panel   Hepatic function panel   CBC with Differential/Platelet   TSH   Hyperglycemia    Low carb diet and exercise.  Follow met b and a1c.   Lab Results  Component Value Date   HGBA1C 5.9 03/23/2021       Relevant Orders   Basic metabolic panel   Hemoglobin A1c   Stress    Overall appears to be handling stress well.  Discussed.  Follow.        Vitamin D deficiency    Continue vitamin D supplements.         Einar Pheasant, MD

## 2021-04-03 ENCOUNTER — Encounter: Payer: Self-pay | Admitting: Internal Medicine

## 2021-04-03 DIAGNOSIS — N644 Mastodynia: Secondary | ICD-10-CM | POA: Insufficient documentation

## 2021-04-03 NOTE — Assessment & Plan Note (Signed)
Resolved. Follow °

## 2021-04-03 NOTE — Assessment & Plan Note (Signed)
Low carb diet and exercise.  Follow met b and a1c.   Lab Results  Component Value Date   HGBA1C 5.9 03/23/2021

## 2021-04-03 NOTE — Assessment & Plan Note (Signed)
Overall appears to be handling stress well.  Discussed.  Follow.   

## 2021-04-03 NOTE — Assessment & Plan Note (Signed)
On crestor.  Tolerating.  Cholesterol improved.  Low cholesterol diet and exercise.  Follow lipid panel and liver function tests.   Lab Results  Component Value Date   CHOL 165 03/23/2021   HDL 45.00 03/23/2021   LDLCALC 108 (H) 03/23/2021   TRIG 62.0 03/23/2021   CHOLHDL 4 03/23/2021

## 2021-04-03 NOTE — Assessment & Plan Note (Signed)
Continue vitamin D supplements.  

## 2021-04-03 NOTE — Assessment & Plan Note (Signed)
Persistent.  Has seen podiatry previously.  Discussed f/u.  Will notify me when agreeable.

## 2021-05-15 ENCOUNTER — Other Ambulatory Visit: Payer: Self-pay | Admitting: Internal Medicine

## 2021-05-15 DIAGNOSIS — Z1231 Encounter for screening mammogram for malignant neoplasm of breast: Secondary | ICD-10-CM

## 2021-06-21 ENCOUNTER — Ambulatory Visit
Admission: RE | Admit: 2021-06-21 | Discharge: 2021-06-21 | Disposition: A | Discharge status: Home / Self Care | Payer: Medicare HMO | Source: Ambulatory Visit | Attending: Internal Medicine | Admitting: Internal Medicine

## 2021-06-21 DIAGNOSIS — Z1231 Encounter for screening mammogram for malignant neoplasm of breast: Secondary | ICD-10-CM | POA: Diagnosis not present

## 2021-07-24 ENCOUNTER — Other Ambulatory Visit (INDEPENDENT_AMBULATORY_CARE_PROVIDER_SITE_OTHER): Payer: Medicare HMO

## 2021-07-24 DIAGNOSIS — E7800 Pure hypercholesterolemia, unspecified: Secondary | ICD-10-CM | POA: Diagnosis not present

## 2021-07-24 DIAGNOSIS — R739 Hyperglycemia, unspecified: Secondary | ICD-10-CM | POA: Diagnosis not present

## 2021-07-24 LAB — HEMOGLOBIN A1C: Hgb A1c MFr Bld: 5.9 % (ref 4.6–6.5)

## 2021-07-24 LAB — CBC WITH DIFFERENTIAL/PLATELET
Basophils Absolute: 0 10*3/uL (ref 0.0–0.1)
Basophils Relative: 0.5 % (ref 0.0–3.0)
Eosinophils Absolute: 0.1 10*3/uL (ref 0.0–0.7)
Eosinophils Relative: 1.8 % (ref 0.0–5.0)
HCT: 40.6 % (ref 36.0–46.0)
Hemoglobin: 13.1 g/dL (ref 12.0–15.0)
Lymphocytes Relative: 29.6 % (ref 12.0–46.0)
Lymphs Abs: 1.6 10*3/uL (ref 0.7–4.0)
MCHC: 32.3 g/dL (ref 30.0–36.0)
MCV: 91.9 fl (ref 78.0–100.0)
Monocytes Absolute: 0.4 10*3/uL (ref 0.1–1.0)
Monocytes Relative: 7.1 % (ref 3.0–12.0)
Neutro Abs: 3.3 10*3/uL (ref 1.4–7.7)
Neutrophils Relative %: 61 % (ref 43.0–77.0)
Platelets: 267 10*3/uL (ref 150.0–400.0)
RBC: 4.42 Mil/uL (ref 3.87–5.11)
RDW: 13.7 % (ref 11.5–15.5)
WBC: 5.5 10*3/uL (ref 4.0–10.5)

## 2021-07-24 LAB — HEPATIC FUNCTION PANEL
ALT: 14 U/L (ref 0–35)
AST: 16 U/L (ref 0–37)
Albumin: 3.9 g/dL (ref 3.5–5.2)
Alkaline Phosphatase: 74 U/L (ref 39–117)
Bilirubin, Direct: 0.2 mg/dL (ref 0.0–0.3)
Total Bilirubin: 1 mg/dL (ref 0.2–1.2)
Total Protein: 7.2 g/dL (ref 6.0–8.3)

## 2021-07-24 LAB — BASIC METABOLIC PANEL
BUN: 13 mg/dL (ref 6–23)
CO2: 30 mEq/L (ref 19–32)
Calcium: 9.5 mg/dL (ref 8.4–10.5)
Chloride: 106 mEq/L (ref 96–112)
Creatinine, Ser: 0.78 mg/dL (ref 0.40–1.20)
GFR: 77.66 mL/min (ref >60.00)
Glucose, Bld: 101 mg/dL — ABNORMAL HIGH (ref 70–99)
Potassium: 4.5 mEq/L (ref 3.5–5.1)
Sodium: 142 mEq/L (ref 135–145)

## 2021-07-24 LAB — LIPID PANEL
Cholesterol: 151 mg/dL (ref 0–200)
HDL: 42.7 mg/dL (ref >39.00)
LDL Cholesterol: 95 mg/dL (ref 0–99)
NonHDL: 107.88
Total CHOL/HDL Ratio: 4
Triglycerides: 65 mg/dL (ref 0.0–149.0)
VLDL: 13 mg/dL (ref 0.0–40.0)

## 2021-07-24 LAB — TSH: TSH: 2.02 u[IU]/mL (ref 0.35–5.50)

## 2021-07-26 ENCOUNTER — Ambulatory Visit (INDEPENDENT_AMBULATORY_CARE_PROVIDER_SITE_OTHER): Payer: Medicare HMO | Admitting: Internal Medicine

## 2021-07-26 ENCOUNTER — Encounter: Payer: Self-pay | Admitting: Internal Medicine

## 2021-07-26 VITALS — BP 126/70 | HR 66 | Temp 98.0°F | Resp 16 | Ht 66.0 in | Wt 248.8 lb

## 2021-07-26 DIAGNOSIS — E7800 Pure hypercholesterolemia, unspecified: Secondary | ICD-10-CM

## 2021-07-26 DIAGNOSIS — R739 Hyperglycemia, unspecified: Secondary | ICD-10-CM

## 2021-07-26 DIAGNOSIS — Z0000 Encounter for general adult medical examination without abnormal findings: Secondary | ICD-10-CM

## 2021-07-26 DIAGNOSIS — E559 Vitamin D deficiency, unspecified: Secondary | ICD-10-CM

## 2021-07-26 DIAGNOSIS — N8110 Cystocele, unspecified: Secondary | ICD-10-CM | POA: Diagnosis not present

## 2021-07-26 DIAGNOSIS — F439 Reaction to severe stress, unspecified: Secondary | ICD-10-CM

## 2021-07-26 DIAGNOSIS — Z1211 Encounter for screening for malignant neoplasm of colon: Secondary | ICD-10-CM | POA: Diagnosis not present

## 2021-07-26 DIAGNOSIS — M79673 Pain in unspecified foot: Secondary | ICD-10-CM

## 2021-07-26 NOTE — Assessment & Plan Note (Addendum)
Physical today 07/26/21.  Refer back to gyn for vaginal exam/pessary maintenance.   Mammogram 06/21/21 - Birads I.  Colonoscopy 07/2012.  IFOB  ?

## 2021-07-26 NOTE — Progress Notes (Signed)
[name], female   DOB: [birth year], 71 y.o.   MRN: [id number] ? ? ?Subjective:  ? ? [name], female    DOB: Oct 07, 1950, 71 y.o.   MRN: [id number] ? ?This visit occurred during the SARS-CoV-2 public health emergency.  Safety protocols were in place, including screening questions prior to the visit, additional usage of staff PPE, and extensive cleaning of exam room while observing appropriate contact time as indicated for disinfecting solutions.  ? ?Patient here for her physical exam.  ? ?Chief Complaint  ?Patient presents with  ? Follow-up  ?  CPE   ? .  ? ?HPI ?Doing relatively well.  Going to the [employer] - exercising.  Discussed diet and exercise.  No chest pain or sob reported.  No abdominal pain.  Bowels moving.  Miralax. Has pessary.  Had questions regarding pessary. Discussed need for gyn f/u for pessary care.  ? ? ?Past Medical History:  ?Diagnosis Date  ? Complication of anesthesia   ? Degenerative disc disease, cervical   ? cervical  ? Hypercholesterolemia   ? Meralgia paraesthetica   ? Migraines   ? Nephrolithiasis   ? Neurodermatitis   ? Obesity   ? Osteopenia   ? PONV (postoperative nausea and vomiting)   ? Vitamin D deficiency   ? ?Past Surgical History:  ?Procedure Laterality Date  ? CHOLECYSTECTOMY N/A 04/24/2015  ? Procedure: LAPAROSCOPIC CHOLECYSTECTOMY WITH INTRAOPERATIVE CHOLANGIOGRAM;  Surgeon: Earline Mayotte, MD;  Location: [facility];  Service: General;  Laterality: N/A;  ? EXTRACORPOREAL SHOCK WAVE LITHOTRIPSY Right 11/17/2014  ? Procedure: EXTRACORPOREAL SHOCK WAVE LITHOTRIPSY (ESWL);  Surgeon: Orson Ape, MD;  Location: [facility];  Service: Urology;  Laterality: Right;  ? EXTRACORPOREAL SHOCK WAVE LITHOTRIPSY Left 12/15/2014  ? Procedure: EXTRACORPOREAL SHOCK WAVE LITHOTRIPSY (ESWL);  Surgeon: Orson Ape, MD;  Location: [facility];  Service: Urology;  Laterality: Left;  ? laparoscopic surgery  1990  ? nephrolithiasis (Dr Orson Slick)  ? LITHOTRIPSY  2010, 1980's   ? pessary  2003, 2016  ? ?Family History  ?Problem Relation Age of Onset  ? Hypertension Mother   ? Hypertension Father   ? Asthma Sister   ? Hypertension Sister   ? Diabetes Sister   ? Breast cancer Sister 7  ? Rectal cancer Sister   ? ?Social History  ? ?Socioeconomic History  ? Marital status: Widowed  ?  Spouse name: Not on file  ? Number of children: 1  ? Years of education: Not on file  ? Highest education level: Not on file  ?Occupational History  ? Not on file  ?Tobacco Use  ? Smoking status: Never  ? Smokeless tobacco: Never  ?Vaping Use  ? Vaping Use: Never used  ?Substance and Sexual Activity  ? Alcohol use: No  ?  Alcohol/week: 0.0 standard drinks  ? Drug use: No  ? Sexual activity: Not Currently  ?  Birth control/protection: Surgical  ?Other Topics Concern  ? Not on file  ?Social History Narrative  ? Not on file  ? ?Social Determinants of Health  ? ?Financial Resource Strain: Low Risk   ? Difficulty of Paying Living Expenses: Not hard at all  ?Food Insecurity: No Food Insecurity  ? Worried About Programme researcher, broadcasting/film/video in the Last Year: Never true  ? Ran Out of Food in the Last Year: Never true  ?Transportation Needs: No Transportation Needs  ? Lack of Transportation (Medical): No  ? Lack of Transportation (Non-Medical):  No  ?Physical Activity: Sufficiently Active  ? Days of Exercise per Week: 3 days  ? Minutes of Exercise per Session: 50 min  ?Stress: No Stress Concern Present  ? Feeling of Stress : Not at all  ?Social Connections: Unknown  ? Frequency of Communication with Friends and Family: More than three times a week  ? Frequency of Social Gatherings with Friends and Family: Not on file  ? Attends Religious Services: Not on file  ? Active Member of Clubs or Organizations: Not on file  ? Attends BankerClub or Organization Meetings: Not on file  ? Marital Status: Not on file  ? ? ? ?Review of Systems  ?Constitutional:  Negative for appetite change and unexpected weight change.  ?HENT:  Negative for  congestion, sinus pressure and sore throat.   ?Eyes:  Negative for pain and visual disturbance.  ?Respiratory:  Negative for cough, chest tightness and shortness of breath.   ?Cardiovascular:  Negative for chest pain, palpitations and leg swelling.  ?Gastrointestinal:  Negative for abdominal pain, diarrhea, nausea and vomiting.  ?Genitourinary:  Negative for difficulty urinating and dysuria.  ?Musculoskeletal:  Negative for joint swelling and myalgias.  ?Skin:  Negative for color change and rash.  ?Neurological:  Negative for dizziness, light-headedness and headaches.  ?Hematological:  Negative for adenopathy. Does not bruise/bleed easily.  ?Psychiatric/Behavioral:  Negative for agitation and dysphoric mood.   ? ?   ?Objective:  ?  ? ?BP 126/70 (BP Location: Left Arm, Patient Position: Sitting, Cuff Size: Large)   Pulse 66   Temp 98 ?F (36.7 ?C) (Temporal)   Resp 16   Ht 5\' 6"  (1.676 m)   Wt 248 lb 12.8 oz (112.9 kg)   LMP 09/10/2016 (Exact Date)   SpO2 99%   BMI 40.16 kg/m?  ?Wt Readings from Last 3 Encounters:  ?07/26/21 248 lb 12.8 oz (112.9 kg)  ?03/27/21 251 lb 3.2 oz (113.9 kg)  ?12/05/20 254 lb (115.2 kg)  ? ? ?Physical Exam ?Vitals reviewed.  ?Constitutional:   ?   General: She is not in acute distress. ?   Appearance: Normal appearance. She is well-developed.  ?HENT:  ?   Head: Normocephalic and atraumatic.  ?   Right Ear: External ear normal.  ?   Left Ear: External ear normal.  ?Eyes:  ?   General: No scleral icterus.    ?   Right eye: No discharge.     ?   Left eye: No discharge.  ?   Conjunctiva/sclera: Conjunctivae normal.  ?Neck:  ?   Thyroid: No thyromegaly.  ?Cardiovascular:  ?   Rate and Rhythm: Normal rate and regular rhythm.  ?Pulmonary:  ?   Effort: No tachypnea, accessory muscle usage or respiratory distress.  ?   Breath sounds: Normal breath sounds. No decreased breath sounds or wheezing.  ?Chest:  ?Breasts: ?   Right: No inverted nipple, mass, nipple discharge or tenderness (no  axillary adenopathy).  ?   Left: No inverted nipple, mass, nipple discharge or tenderness (no axilarry adenopathy).  ?Abdominal:  ?   General: Bowel sounds are normal.  ?   Palpations: Abdomen is soft.  ?   Tenderness: There is no abdominal tenderness.  ?Musculoskeletal:     ?   General: No swelling or tenderness.  ?   Cervical back: Neck supple.  ?Lymphadenopathy:  ?   Cervical: No cervical adenopathy.  ?Skin: ?   Findings: No erythema or rash.  ?Neurological:  ?   Mental Status:  She is alert and oriented to person, place, and time.  ?Psychiatric:     ?   Mood and Affect: Mood normal.     ?   Behavior: Behavior normal.  ? ? ? ?Outpatient Encounter Medications as of 07/26/2021  ?Medication Sig  ? rosuvastatin (CRESTOR) 5 MG tablet TAKE 1 TABLET BY MOUTH THREE DAYS PER WEEK  ? Vitamin D, Cholecalciferol, 25 MCG (1000 UT) TABS Take 1,000 Units by mouth daily.  ? ?No facility-administered encounter medications on file as of 07/26/2021.  ?  ? ?Lab Results  ?Component Value Date  ? WBC 5.5 07/24/2021  ? HGB 13.1 07/24/2021  ? HCT 40.6 07/24/2021  ? PLT 267.0 07/24/2021  ? GLUCOSE 101 (H) 07/24/2021  ? CHOL 151 07/24/2021  ? TRIG 65.0 07/24/2021  ? HDL 42.70 07/24/2021  ? LDLCALC 95 07/24/2021  ? ALT 14 07/24/2021  ? AST 16 07/24/2021  ? NA 142 07/24/2021  ? K 4.5 07/24/2021  ? CL 106 07/24/2021  ? CREATININE 0.78 07/24/2021  ? BUN 13 07/24/2021  ? CO2 30 07/24/2021  ? TSH 2.02 07/24/2021  ? HGBA1C 5.9 07/24/2021  ? ? ?MM 3D SCREEN BREAST BILATERAL ? ?Result Date: 06/21/2021 ?CLINICAL DATA:  Screening. EXAM: DIGITAL SCREENING BILATERAL MAMMOGRAM WITH TOMOSYNTHESIS AND CAD TECHNIQUE: Bilateral screening digital craniocaudal and mediolateral oblique mammograms were obtained. Bilateral screening digital breast tomosynthesis was performed. The images were evaluated with computer-aided detection. COMPARISON:  Previous exam(s). ACR Breast Density Category b: There are scattered areas of fibroglandular density. FINDINGS: There are  no findings suspicious for malignancy. IMPRESSION: No mammographic evidence of malignancy. A result letter of this screening mammogram will be mailed directly to the patient. RECOMMENDATION: Screening mammogram in one year

## 2021-07-30 ENCOUNTER — Encounter: Payer: Self-pay | Admitting: Internal Medicine

## 2021-07-30 NOTE — Assessment & Plan Note (Signed)
Has pessary.  Was followed by Dr Gilman Schmidt.  Refer back to [facility].  ?

## 2021-07-30 NOTE — Assessment & Plan Note (Signed)
Persistent.  Has seen podiatry previously.  Discussed f/u.  Will notify me when agreeable.   °

## 2021-07-30 NOTE — Assessment & Plan Note (Signed)
Overall appears to be handling stress well.  Discussed.  Follow.   

## 2021-07-30 NOTE — Assessment & Plan Note (Signed)
On crestor.  Tolerating.  Cholesterol improved.  Low cholesterol diet and exercise.  Follow lipid panel and liver function tests.   ?Lab Results  ?Component Value Date  ? CHOL 151 07/24/2021  ? HDL 42.70 07/24/2021  ? LDLCALC 95 07/24/2021  ? TRIG 65.0 07/24/2021  ? CHOLHDL 4 07/24/2021  ? ?

## 2021-07-30 NOTE — Assessment & Plan Note (Signed)
Continue vitamin D supplements.  

## 2021-07-30 NOTE — Assessment & Plan Note (Signed)
Low carb diet and exercise.  Follow met b and a1c.   ?Lab Results  ?Component Value Date  ? HGBA1C 5.9 07/24/2021  ? ?

## 2021-08-08 ENCOUNTER — Ambulatory Visit (INDEPENDENT_AMBULATORY_CARE_PROVIDER_SITE_OTHER): Payer: Medicare HMO

## 2021-08-08 VITALS — Ht 66.0 in | Wt 248.0 lb

## 2021-08-08 DIAGNOSIS — Z0000 Encounter for general adult medical examination without abnormal findings: Secondary | ICD-10-CM | POA: Diagnosis not present

## 2021-08-08 DIAGNOSIS — Z1211 Encounter for screening for malignant neoplasm of colon: Secondary | ICD-10-CM | POA: Diagnosis not present

## 2021-08-08 NOTE — Progress Notes (Addendum)
Subjective:   Nicole Hardy is a 71 y.o. female who presents for Medicare Annual (Subsequent) preventive examination.  Review of Systems    No ROS.  Medicare Wellness Virtual Visit.  Visual/audio telehealth visit, UTA vital signs.   See social history for additional risk factors.   Cardiac Risk Factors include: advanced age (>2men, >39 women)     Objective:    Today's Vitals   08/08/21 1449  Weight: 248 lb (112.5 kg)  Height: 5\' 6"  (1.676 m)   Body mass index is 40.03 kg/m.     08/08/2021    2:48 PM 08/04/2020   12:38 PM 08/04/2019   11:42 AM 08/03/2018    1:40 PM 04/24/2015    9:31 AM 04/18/2015    9:11 AM 11/17/2014    6:51 AM  Advanced Directives  Does Patient Have a Medical Advance Directive? Yes Yes No No No Yes;No No  Type of Estate agent of [employer] Power of [place];Living will       Does patient want to make changes to medical advance directive? No - Patient declined No - Patient declined No - Patient declined   No - Patient declined   Copy of Healthcare Power of Attorney in Chart? No - copy requested No - copy requested       Would patient like information on creating a medical advance directive?    No - Patient declined   No - patient declined information    Current Medications (verified) Outpatient Encounter Medications as of 08/08/2021  Medication Sig   rosuvastatin (CRESTOR) 5 MG tablet TAKE 1 TABLET BY MOUTH THREE DAYS PER WEEK   Vitamin D, Cholecalciferol, 25 MCG (1000 UT) TABS Take 1,000 Units by mouth daily.   No facility-administered encounter medications on file as of 08/08/2021.    Allergies (verified) Metronidazole   History: Past Medical History:  Diagnosis Date   Complication of anesthesia    Degenerative disc disease, cervical    cervical   Hypercholesterolemia    Meralgia paraesthetica    Migraines    Nephrolithiasis    Neurodermatitis    Obesity    Osteopenia    PONV (postoperative nausea and vomiting)     Vitamin D deficiency    Past Surgical History:  Procedure Laterality Date   CHOLECYSTECTOMY N/A 04/24/2015   Procedure: LAPAROSCOPIC CHOLECYSTECTOMY WITH INTRAOPERATIVE CHOLANGIOGRAM;  Surgeon: Earline Mayotte, MD;  Location: [facility] ORS;  Service: General;  Laterality: N/A;   EXTRACORPOREAL SHOCK WAVE LITHOTRIPSY Right 11/17/2014   Procedure: EXTRACORPOREAL SHOCK WAVE LITHOTRIPSY (ESWL);  Surgeon: Orson Ape, MD;  Location: [facility] ORS;  Service: Urology;  Laterality: Right;   EXTRACORPOREAL SHOCK WAVE LITHOTRIPSY Left 12/15/2014   Procedure: EXTRACORPOREAL SHOCK WAVE LITHOTRIPSY (ESWL);  Surgeon: Orson Ape, MD;  Location: [facility] ORS;  Service: Urology;  Laterality: Left;   laparoscopic surgery  1990   nephrolithiasis (Dr Orson Slick)   LITHOTRIPSY  2010, 1980's   pessary  2003, 2016   Family History  Problem Relation Age of Onset   Hypertension Mother    Hypertension Father    Asthma Sister    Hypertension Sister    Diabetes Sister    Breast cancer Sister 6   Rectal cancer Sister    Social History   Socioeconomic History   Marital status: Widowed    Spouse name: Not on file   Number of children: 1   Years of education: Not on file   Highest education level: Not on file  Occupational History   Not on file  Tobacco Use   Smoking status: Never   Smokeless tobacco: Never  Vaping Use   Vaping Use: Never used  Substance and Sexual Activity   Alcohol use: No    Alcohol/week: 0.0 standard drinks   Drug use: No   Sexual activity: Not Currently    Birth control/protection: Surgical  Other Topics Concern   Not on file  Social History Narrative   Not on file   Social Determinants of Health   Financial Resource Strain: Low Risk    Difficulty of Paying Living Expenses: Not hard at all  Food Insecurity: No Food Insecurity   Worried About Programme researcher, broadcasting/film/video in the Last Year: Never true   Ran Out of Food in the Last Year: Never true  Transportation Needs: No Transportation  Needs   Lack of Transportation (Medical): No   Lack of Transportation (Non-Medical): No  Physical Activity: Sufficiently Active   Days of Exercise per Week: 4 days   Minutes of Exercise per Session: 60 min  Stress: No Stress Concern Present   Feeling of Stress : Not at all  Social Connections: Unknown   Frequency of Communication with Friends and Family: More than three times a week   Frequency of Social Gatherings with Friends and Family: More than three times a week   Attends Religious Services: Not on Scientist, clinical (histocompatibility and immunogenetics) or Organizations: Not on file   Attends Banker Meetings: Not on file   Marital Status: Not on file    Tobacco Counseling Counseling given: Not Answered   Clinical Intake:  Pre-visit preparation completed: Yes        Diabetes: No  How often do you need to have someone help you when you read instructions, pamphlets, or other written materials from your doctor or pharmacy?: 1 - Never    Interpreter Needed?: No      Activities of Daily Living    08/08/2021    2:51 PM  In your present state of health, do you have any difficulty performing the following activities:  Hearing? 0  Vision? 0  Difficulty concentrating or making decisions? 0  Walking or climbing stairs? 0  Dressing or bathing? 0  Doing errands, shopping? 0  Preparing Food and eating ? N  Using the Toilet? N  In the past six months, have you accidently leaked urine? N  Do you have problems with loss of bowel control? N  Managing your Medications? N  Managing your Finances? N  Housekeeping or managing your Housekeeping? N    Patient Care Team: Dale H[place], MD as PCP - General (Internal Medicine) Lemar Livings Merrily Pew, MD as Consulting Physician (General Surgery) Dale Commerce, MD as Referring Physician (Internal Medicine)  Indicate any recent Medical Services you may have received from other than [facility] providers in the past year (date may be  approximate).     Assessment:   This is a routine wellness examination for [place].  Virtual Visit via Telephone Note  I connected with  [name] on 08/08/21 at  2:45 PM EDT by telephone and verified that I am speaking with the correct person using two identifiers.  Persons participating in the virtual visit: patient/Nurse Health Advisor   I discussed the limitations of performing an evaluation and management service by telehealth. The patient expressed understanding and agreed to proceed. We continued and completed visit with audio only. Some vital signs may be absent or patient reported.  Hearing/Vision screen Hearing Screening - Comments:: Patient is able to hear conversational tones without difficulty.  No issues reported. Vision Screening - Comments:: Followed by [facility]  Wears corrective lenses  She has seen her ophthalmologist within the last 12 months  Dietary issues and exercise activities discussed: Current Exercise Habits: Home exercise routine, Time (Minutes): 60, Frequency (Times/Week): 4, Weekly Exercise (Minutes/Week): 240, Intensity: Mild   Goals Addressed               This Visit's Progress     Patient Stated     Maintain Healthy Lifestyle (pt-stated)        Stay active. Healthy diet.        Depression Screen    08/08/2021    2:54 PM 07/26/2021    9:10 AM 08/04/2020   12:18 PM 12/20/2019    9:13 AM 08/04/2019   11:44 AM 08/03/2018    1:40 PM 06/09/2018   10:38 AM  PHQ 2/9 Scores  PHQ - 2 Score 0 0 0 0 0 0 0  PHQ- 9 Score      0 0    Fall Risk    08/08/2021    3:02 PM 07/26/2021    9:09 AM 08/04/2020   12:25 PM 03/23/2020    4:00 PM 12/20/2019    9:13 AM  Fall Risk   Falls in the past year? 0 0 0 0 0  Number falls in past yr: 0  0  0  Injury with Fall? 0  0  0  Risk for fall due to :  No Fall Risks     Follow up Falls evaluation completed Falls evaluation completed Falls evaluation completed Falls evaluation completed Falls  evaluation completed    FALL RISK PREVENTION PERTAINING TO THE HOME: Home free of loose throw rugs in walkways, pet beds, electrical cords, etc? Yes  Adequate lighting in your home to reduce risk of falls? Yes   ASSISTIVE DEVICES UTILIZED TO PREVENT FALLS: Use of a cane, walker or w/c? No   TIMED UP AND GO: Was the test performed? No .   Cognitive Function:  Patient is alert and oriented x3.       08/08/2021    3:06 PM 08/04/2019   12:15 PM 08/03/2018    1:42 PM  6CIT Screen  What Year? 0 points 0 points 0 points  What month? 0 points 0 points 0 points  What time? 0 points 0 points 0 points  Count back from 20 0 points  0 points  Months in reverse 0 points  0 points  Repeat phrase 0 points  0 points  Total Score 0 points  0 points    Immunizations Immunization History  Administered Date(s) Administered   PFIZER(Purple Top)SARS-COV-2 Vaccination 08/03/2019, 08/23/2019, 04/03/2020   PPD Test 12/25/2015, 11/20/2016   Pfizer Covid-19 Vaccine Bivalent Booster 24yrs & up 03/16/2021    TDAP status: Due, Education has been provided regarding the importance of this vaccine. Advised may receive this vaccine at local pharmacy or Health Dept. Aware to provide a copy of the vaccination record if obtained from local pharmacy or Health Dept. Verbalized acceptance and understanding.Deferred.   Shingrix Completed?: No.    Education has been provided regarding the importance of this vaccine. Patient has been advised to call insurance company to determine out of pocket expense if they have not yet received this vaccine. Advised may also receive vaccine at local pharmacy or Health Dept. Verbalized acceptance and understanding.  Screening  Tests Health Maintenance  Topic Date Due   Zoster Vaccines- Shingrix (1 of 2) 11/08/2021 (Originally 06/09/2002)   Pneumonia Vaccine 58+ Years old (1 - PCV) 03/27/2022 (Originally 06/08/2017)   TETANUS/TDAP  08/09/2022 (Originally 06/09/1971)   COLONOSCOPY (Pts  45-85yrs Insurance coverage will need to be confirmed)  07/24/2022   MAMMOGRAM  06/22/2023   DEXA SCAN  Completed   COVID-19 Vaccine  Completed   Hepatitis C Screening  Completed   HPV VACCINES  Aged Out   INFLUENZA VACCINE  Discontinued   Health Maintenance There are no preventive care reminders to display for this patient.  Lung Cancer Screening: (Low Dose CT Chest recommended if Age 52-80 years, 30 pack-year currently smoking OR have quit w/in 15years.) does not qualify.   Vision Screening: Recommended annual ophthalmology exams for early detection of glaucoma and other disorders of the eye.   Dental Screening: Recommended annual dental exams for proper oral hygiene. Visits every 6 months.   Community Resource Referral / Chronic Care Management: CRR required this visit?  No   CCM required this visit?  No      Plan:   Keep all routine maintenance appointments.   I have personally reviewed and noted the following in the patient's chart:   Medical and social history Use of alcohol, tobacco or illicit drugs  Current medications and supplements including opioid prescriptions.  Functional ability and status Nutritional status Physical activity Advanced directives List of other physicians Hospitalizations, surgeries, and ER visits in previous 12 months Vitals Screenings to include cognitive, depression, and falls Referrals and appointments  In addition, I have reviewed and discussed with patient certain preventive protocols, quality metrics, and best practice recommendations. A written personalized care plan for preventive services as well as general preventive health recommendations were provided to patient.     OBrien-Blaney, Jermiyah Ricotta L, LPN   04/13/1094    I have reviewed the above information and agree with above.   Duncan Dull, MD

## 2021-08-08 NOTE — Patient Instructions (Addendum)
?  [name] , ?Thank you for taking time to come for your Medicare Wellness Visit. I appreciate your ongoing commitment to your health goals. Please review the following plan we discussed and let me know if I can assist you in the future.  ? ?These are the goals we discussed: ? Goals   ? ?  ? Patient Stated  ?   Maintain Healthy Lifestyle (pt-stated)   ?   Stay active ?Healthy diet ? ?  ?   Weight Loss (pt-stated)   ?   Patient Goals/Self-Care Activities ?Over the next 90 days, patient will:  ?- take medications as prescribed ?target a minimum of 150 minutes of moderate intensity exercise weekly ?engage in dietary modifications by moderating carbohydrate portion sizes ?  ? ?  ?  ?This is a list of the screening recommended for you and due dates:  ?Health Maintenance  ?Topic Date Due  ? Zoster (Shingles) Vaccine (1 of 2) 11/08/2021*  ? Pneumonia Vaccine (1 - PCV) 03/27/2022*  ? Tetanus Vaccine  08/09/2022*  ? Colon Cancer Screening  07/24/2022  ? Mammogram  06/22/2023  ? DEXA scan (bone density measurement)  Completed  ? COVID-19 Vaccine  Completed  ? Hepatitis C Screening: USPSTF Recommendation to screen - Ages 26-79 yo.  Completed  ? HPV Vaccine  Aged Out  ? Flu Shot  Discontinued  ?*Topic was postponed. The date shown is not the original due date.  ?  ?

## 2021-08-16 ENCOUNTER — Encounter: Payer: Self-pay | Admitting: Obstetrics

## 2021-08-16 ENCOUNTER — Encounter: Payer: Medicare HMO | Admitting: Obstetrics

## 2021-08-17 LAB — FECAL OCCULT BLOOD, IMMUNOCHEMICAL: Fecal Occult Bld: NEGATIVE

## 2021-08-17 NOTE — Progress Notes (Signed)
error 

## 2021-11-08 ENCOUNTER — Telehealth: Payer: Self-pay | Admitting: Internal Medicine

## 2021-11-08 NOTE — Telephone Encounter (Signed)
Patient would like to know if the following screening would be beneficial : Carotid Artery  Afib  AAA  PAD

## 2021-11-09 NOTE — Telephone Encounter (Signed)
Lm for pt to cb re : can be discussed at upcoming appt w/ dr Lorin Picket

## 2021-11-22 ENCOUNTER — Other Ambulatory Visit (INDEPENDENT_AMBULATORY_CARE_PROVIDER_SITE_OTHER): Payer: Medicare HMO

## 2021-11-22 DIAGNOSIS — R739 Hyperglycemia, unspecified: Secondary | ICD-10-CM

## 2021-11-22 DIAGNOSIS — E7800 Pure hypercholesterolemia, unspecified: Secondary | ICD-10-CM

## 2021-11-22 LAB — BASIC METABOLIC PANEL
BUN: 11 mg/dL (ref 6–23)
CO2: 26 mEq/L (ref 19–32)
Calcium: 9.4 mg/dL (ref 8.4–10.5)
Chloride: 108 mEq/L (ref 96–112)
Creatinine, Ser: 0.8 mg/dL (ref 0.40–1.20)
GFR: 75.16 mL/min (ref >60.00)
Glucose, Bld: 102 mg/dL — ABNORMAL HIGH (ref 70–99)
Potassium: 4.9 mEq/L (ref 3.5–5.1)
Sodium: 141 mEq/L (ref 135–145)

## 2021-11-22 LAB — HEPATIC FUNCTION PANEL
ALT: 15 U/L (ref 0–35)
AST: 19 U/L (ref 0–37)
Albumin: 3.9 g/dL (ref 3.5–5.2)
Alkaline Phosphatase: 69 U/L (ref 39–117)
Bilirubin, Direct: 0.1 mg/dL (ref 0.0–0.3)
Total Bilirubin: 0.8 mg/dL (ref 0.2–1.2)
Total Protein: 7.1 g/dL (ref 6.0–8.3)

## 2021-11-22 LAB — LIPID PANEL
Cholesterol: 151 mg/dL (ref 0–200)
HDL: 43.6 mg/dL (ref >39.00)
LDL Cholesterol: 96 mg/dL (ref 0–99)
NonHDL: 107.68
Total CHOL/HDL Ratio: 3
Triglycerides: 58 mg/dL (ref 0.0–149.0)
VLDL: 11.6 mg/dL (ref 0.0–40.0)

## 2021-11-22 LAB — HEMOGLOBIN A1C: Hgb A1c MFr Bld: 6.1 % (ref 4.6–6.5)

## 2021-11-27 ENCOUNTER — Encounter: Payer: Self-pay | Admitting: Internal Medicine

## 2021-11-27 ENCOUNTER — Telehealth: Payer: Self-pay

## 2021-11-27 ENCOUNTER — Ambulatory Visit (INDEPENDENT_AMBULATORY_CARE_PROVIDER_SITE_OTHER): Payer: Medicare HMO | Admitting: Internal Medicine

## 2021-11-27 VITALS — BP 124/70 | HR 82 | Temp 98.1°F | Resp 17 | Ht 66.0 in | Wt 245.4 lb

## 2021-11-27 DIAGNOSIS — F439 Reaction to severe stress, unspecified: Secondary | ICD-10-CM | POA: Diagnosis not present

## 2021-11-27 DIAGNOSIS — E7800 Pure hypercholesterolemia, unspecified: Secondary | ICD-10-CM

## 2021-11-27 DIAGNOSIS — R739 Hyperglycemia, unspecified: Secondary | ICD-10-CM

## 2021-11-27 DIAGNOSIS — Z136 Encounter for screening for cardiovascular disorders: Secondary | ICD-10-CM

## 2021-11-27 MED ORDER — ROSUVASTATIN CALCIUM 5 MG PO TABS: ORAL_TABLET | ORAL | 3 refills | Status: DC

## 2021-11-27 NOTE — Telephone Encounter (Signed)
Patient states she is returning our call.   

## 2021-11-27 NOTE — Progress Notes (Signed)
[name], female   DOB: 06/12/1950, 71 y.o.   MRN: [id number]   Subjective:    [name], female    DOB: [birth year], 71 y.o.   MRN: [id number]   Patient here for a scheduled follow up.  Chief Complaint  Patient presents with   Hyperlipidemia   .   HPI Here to follow up regarding her cholesterol and blood sugar.  Reports she is doing relatively well.  No chest pain or sob reported.  No abdominal pain.  Bowels moving.  Handling stress.  Discussed calcium score and risk factor modificaiton.    Past Medical History:  Diagnosis Date   Complication of anesthesia    Degenerative disc disease, cervical    cervical   Hypercholesterolemia    Meralgia paraesthetica    Migraines    Nephrolithiasis    Neurodermatitis    Obesity    Osteopenia    PONV (postoperative nausea and vomiting)    Vitamin D deficiency    Past Surgical History:  Procedure Laterality Date   CHOLECYSTECTOMY N/A 04/24/2015   Procedure: LAPAROSCOPIC CHOLECYSTECTOMY WITH INTRAOPERATIVE CHOLANGIOGRAM;  Surgeon: Robert Bellow, MD;  Location: [facility] ORS;  Service: General;  Laterality: N/A;   EXTRACORPOREAL SHOCK WAVE LITHOTRIPSY Right 11/17/2014   Procedure: EXTRACORPOREAL SHOCK WAVE LITHOTRIPSY (ESWL);  Surgeon: Royston Cowper, MD;  Location: [facility] ORS;  Service: Urology;  Laterality: Right;   EXTRACORPOREAL SHOCK WAVE LITHOTRIPSY Left 12/15/2014   Procedure: EXTRACORPOREAL SHOCK WAVE LITHOTRIPSY (ESWL);  Surgeon: Royston Cowper, MD;  Location: [facility] ORS;  Service: Urology;  Laterality: Left;   laparoscopic surgery  04/08/1988   nephrolithiasis (Dr Ernst Spell)   LITHOTRIPSY  2010, 1980's   pessary  2003, 2016   TOTAL VAGINAL HYSTERECTOMY  2016   Family History  Problem Relation Age of Onset   Hypertension Mother    Hypertension Father    Asthma Sister    Hypertension Sister    Diabetes Sister    Breast cancer Sister 27   Rectal cancer Sister    Social History   Socioeconomic History    Marital status: Widowed    Spouse name: Not on file   Number of children: 1   Years of education: Not on file   Highest education level: Not on file  Occupational History   Not on file  Tobacco Use   Smoking status: Never   Smokeless tobacco: Never  Vaping Use   Vaping Use: Never used  Substance and Sexual Activity   Alcohol use: No    Alcohol/week: 0.0 standard drinks of alcohol   Drug use: No   Sexual activity: Not Currently    Birth control/protection: Surgical  Other Topics Concern   Not on file  Social History Narrative   Not on file   Social Determinants of Health   Financial Resource Strain: Low Risk  (08/08/2021)   Overall Financial Resource Strain (CARDIA)    Difficulty of Paying Living Expenses: Not hard at all  Food Insecurity: No Food Insecurity (08/08/2021)   Hunger Vital Sign    Worried About Running Out of Food in the Last Year: Never true    [place] in the Last Year: Never true  Transportation Needs: No Transportation Needs (08/08/2021)   PRAPARE - Hydrologist (Medical): No    Lack of Transportation (Non-Medical): No  Physical Activity: Sufficiently Active (08/08/2021)   Exercise Vital Sign    Days of Exercise per Week:  4 days    Minutes of Exercise per Session: 60 min  Stress: No Stress Concern Present (08/08/2021)   [place]    Feeling of Stress : Not at all  Social Connections: Unknown (08/08/2021)   Social Connection and Isolation Panel [NHANES]    Frequency of Communication with Friends and Family: More than three times a week    Frequency of Social Gatherings with Friends and Family: More than three times a week    Attends Religious Services: Not on Advertising copywriter or Organizations: Not on file    Attends Archivist Meetings: Not on file    Marital Status: Not on file     Review of Systems  Constitutional:  Negative for  appetite change and unexpected weight change.  HENT:  Negative for congestion and sinus pressure.   Respiratory:  Negative for cough, chest tightness and shortness of breath.   Cardiovascular:  Negative for chest pain, palpitations and leg swelling.  Gastrointestinal:  Negative for abdominal pain, diarrhea, nausea and vomiting.  Genitourinary:  Negative for difficulty urinating and dysuria.  Musculoskeletal:  Negative for joint swelling and myalgias.  Skin:  Negative for color change and rash.  Neurological:  Negative for dizziness, light-headedness and headaches.  Psychiatric/Behavioral:  Negative for agitation and dysphoric mood.        Objective:     BP 124/70 (BP Location: Left Arm, Patient Position: Sitting, Cuff Size: Large)   Pulse 82   Temp 98.1 F (36.7 C) (Temporal)   Resp 17   Ht $R'5\' 6"'wU$  (1.676 m)   Wt 245 lb 6.4 oz (111.3 kg)   LMP 09/10/2016 (Exact Date)   SpO2 98%   BMI 39.61 kg/m  Wt Readings from Last 3 Encounters:  11/27/21 245 lb 6.4 oz (111.3 kg)  08/16/21 246 lb (111.6 kg)  08/08/21 248 lb (112.5 kg)    Physical Exam Vitals reviewed.  Constitutional:      General: She is not in acute distress.    Appearance: Normal appearance.  HENT:     Head: Normocephalic and atraumatic.     Right Ear: External ear normal.     Left Ear: External ear normal.  Eyes:     General: No scleral icterus.       Right eye: No discharge.        Left eye: No discharge.     Conjunctiva/sclera: Conjunctivae normal.  Neck:     Thyroid: No thyromegaly.  Cardiovascular:     Rate and Rhythm: Normal rate and regular rhythm.  Pulmonary:     Effort: No respiratory distress.     Breath sounds: Normal breath sounds. No wheezing.  Abdominal:     General: Bowel sounds are normal.     Palpations: Abdomen is soft.     Tenderness: There is no abdominal tenderness.  Musculoskeletal:        General: No swelling or tenderness.     Cervical back: Neck supple. No tenderness.   Lymphadenopathy:     Cervical: No cervical adenopathy.  Skin:    Findings: No erythema or rash.  Neurological:     Mental Status: She is alert.  Psychiatric:        Mood and Affect: Mood normal.        Behavior: Behavior normal.      Outpatient Encounter Medications as of 11/27/2021  Medication Sig   Cholecalciferol (VITAMIN D3) 50 MCG (  2000 UT) TABS Take by mouth.   [DISCONTINUED] rosuvastatin (CRESTOR) 5 MG tablet TAKE 1 TABLET BY MOUTH THREE DAYS PER WEEK   rosuvastatin (CRESTOR) 5 MG tablet TAKE 1 TABLET BY MOUTH THREE DAYS PER WEEK   [DISCONTINUED] Vitamin D, Cholecalciferol, 25 MCG (1000 UT) TABS Take 1,000 Units by mouth daily.   No facility-administered encounter medications on file as of 11/27/2021.     Lab Results  Component Value Date   WBC 5.5 07/24/2021   HGB 13.1 07/24/2021   HCT 40.6 07/24/2021   PLT 267.0 07/24/2021   GLUCOSE 102 (H) 11/22/2021   CHOL 151 11/22/2021   TRIG 58.0 11/22/2021   HDL 43.60 11/22/2021   LDLCALC 96 11/22/2021   ALT 15 11/22/2021   AST 19 11/22/2021   NA 141 11/22/2021   K 4.9 11/22/2021   CL 108 11/22/2021   CREATININE 0.80 11/22/2021   BUN 11 11/22/2021   CO2 26 11/22/2021   TSH 2.02 07/24/2021   HGBA1C 6.1 11/22/2021    MM 3D SCREEN BREAST BILATERAL  Result Date: 06/21/2021 CLINICAL DATA:  Screening. EXAM: DIGITAL SCREENING BILATERAL MAMMOGRAM WITH TOMOSYNTHESIS AND CAD TECHNIQUE: Bilateral screening digital craniocaudal and mediolateral oblique mammograms were obtained. Bilateral screening digital breast tomosynthesis was performed. The images were evaluated with computer-aided detection. COMPARISON:  Previous exam(s). ACR Breast Density Category b: There are scattered areas of fibroglandular density. FINDINGS: There are no findings suspicious for malignancy. IMPRESSION: No mammographic evidence of malignancy. A result letter of this screening mammogram will be mailed directly to the patient. RECOMMENDATION: Screening  mammogram in one year. (Code:SM-B-01Y) BI-RADS CATEGORY  1: Negative. Electronically Signed   By: Nolon Nations M.D.   On: 06/21/2021 11:51       Assessment & Plan:   Problem List Items Addressed This Visit     Hypercholesterolemia - Primary    On crestor.  Tolerating.  Cholesterol improved.  Low cholesterol diet and exercise.  Follow lipid panel and liver function tests.   Lab Results  Component Value Date   CHOL 151 11/22/2021   HDL 43.60 11/22/2021   LDLCALC 96 11/22/2021   TRIG 58.0 11/22/2021   CHOLHDL 3 11/22/2021       Relevant Medications   rosuvastatin (CRESTOR) 5 MG tablet   Other Relevant Orders   HgB A1c   Hyperglycemia    Low carb diet and exercise.  Follow met b and a1c.   Lab Results  Component Value Date   HGBA1C 6.1 11/22/2021       Relevant Orders   Lipid Profile   Hepatic function panel   Basic Metabolic Panel (BMET)   Screening for heart disease    Discussed screening and risk factor modification.  Agreeable to calcium score.        Relevant Orders   CT CARDIAC SCORING   Stress    Overall appears to be handling stress well.  Discussed.  Follow.          Einar Pheasant, MD

## 2021-12-09 ENCOUNTER — Encounter: Payer: Self-pay | Admitting: Internal Medicine

## 2021-12-09 NOTE — Assessment & Plan Note (Signed)
Discussed screening and risk factor modification.  Agreeable to calcium score.

## 2021-12-09 NOTE — Assessment & Plan Note (Signed)
On crestor.  Tolerating.  Cholesterol improved.  Low cholesterol diet and exercise.  Follow lipid panel and liver function tests.   Lab Results  Component Value Date   CHOL 151 11/22/2021   HDL 43.60 11/22/2021   LDLCALC 96 11/22/2021   TRIG 58.0 11/22/2021   CHOLHDL 3 11/22/2021

## 2021-12-09 NOTE — Assessment & Plan Note (Signed)
Low carb diet and exercise.  Follow met b and a1c.   Lab Results  Component Value Date   HGBA1C 6.1 11/22/2021

## 2021-12-09 NOTE — Assessment & Plan Note (Signed)
Overall appears to be handling stress well.  Discussed.  Follow.   

## 2021-12-19 ENCOUNTER — Ambulatory Visit
Admission: RE | Admit: 2021-12-19 | Discharge: 2021-12-19 | Disposition: A | Discharge status: Home / Self Care | Payer: Medicare HMO | Source: Ambulatory Visit | Attending: Internal Medicine | Admitting: Internal Medicine

## 2021-12-19 DIAGNOSIS — Z136 Encounter for screening for cardiovascular disorders: Secondary | ICD-10-CM | POA: Insufficient documentation

## 2021-12-20 ENCOUNTER — Encounter: Payer: Self-pay | Admitting: Internal Medicine

## 2021-12-20 ENCOUNTER — Telehealth: Payer: Self-pay

## 2021-12-20 DIAGNOSIS — I700 Atherosclerosis of aorta: Secondary | ICD-10-CM | POA: Insufficient documentation

## 2021-12-20 NOTE — Telephone Encounter (Signed)
Lvm for to return call in regards to CT results.  Per Dr.Scott: Please call and notify Nicole Hardy that I reviewed her recent CT scan results and given her calcium score, it is recommended to continue diet and exercise.  Also, if she is tolerating the cholesterol medication three days per week, I would like to increase to 5mg  q day.  No other acute abnormality noted on scan.

## 2021-12-24 ENCOUNTER — Other Ambulatory Visit: Payer: Self-pay | Admitting: *Deleted

## 2021-12-24 DIAGNOSIS — E7800 Pure hypercholesterolemia, unspecified: Secondary | ICD-10-CM

## 2021-12-24 MED ORDER — ROSUVASTATIN CALCIUM 5 MG PO TABS: 5.0000 mg | ORAL_TABLET | Freq: Every day | ORAL | 1 refills | Status: DC

## 2021-12-24 MED ORDER — ROSUVASTATIN CALCIUM 5 MG PO TABS: 5.0000 mg | ORAL_TABLET | Freq: Every day | ORAL | 3 refills | Status: DC

## 2021-12-24 NOTE — Progress Notes (Signed)
Rx ok'd for crestor 5mg  q day #90 with one refill.

## 2021-12-31 ENCOUNTER — Ambulatory Visit (LOCAL_COMMUNITY_HEALTH_CENTER): Payer: Self-pay

## 2021-12-31 DIAGNOSIS — Z111 Encounter for screening for respiratory tuberculosis: Secondary | ICD-10-CM

## 2022-01-01 ENCOUNTER — Telehealth: Payer: Self-pay | Admitting: Internal Medicine

## 2022-01-01 NOTE — Telephone Encounter (Signed)
[place] called in regards to clarification on the medication rosuvastatin. [facility] states that they have been waiting for over a week for directions.

## 2022-01-03 ENCOUNTER — Ambulatory Visit (LOCAL_COMMUNITY_HEALTH_CENTER): Payer: Self-pay

## 2022-01-03 DIAGNOSIS — Z111 Encounter for screening for respiratory tuberculosis: Secondary | ICD-10-CM

## 2022-01-03 LAB — TB SKIN TEST
Induration: 0 mm
TB Skin Test: NEGATIVE

## 2022-03-21 ENCOUNTER — Other Ambulatory Visit (INDEPENDENT_AMBULATORY_CARE_PROVIDER_SITE_OTHER): Payer: Medicare HMO

## 2022-03-21 DIAGNOSIS — R739 Hyperglycemia, unspecified: Secondary | ICD-10-CM | POA: Diagnosis not present

## 2022-03-21 DIAGNOSIS — E7800 Pure hypercholesterolemia, unspecified: Secondary | ICD-10-CM | POA: Diagnosis not present

## 2022-03-21 LAB — HEPATIC FUNCTION PANEL
ALT: 14 U/L (ref 0–35)
AST: 19 U/L (ref 0–37)
Albumin: 4 g/dL (ref 3.5–5.2)
Alkaline Phosphatase: 63 U/L (ref 39–117)
Bilirubin, Direct: 0.3 mg/dL (ref 0.0–0.3)
Total Bilirubin: 1.5 mg/dL — ABNORMAL HIGH (ref 0.2–1.2)
Total Protein: 7.2 g/dL (ref 6.0–8.3)

## 2022-03-21 LAB — LIPID PANEL
Cholesterol: 145 mg/dL (ref 0–200)
HDL: 43.3 mg/dL (ref >39.00)
LDL Cholesterol: 90 mg/dL (ref 0–99)
NonHDL: 101.88
Total CHOL/HDL Ratio: 3
Triglycerides: 58 mg/dL (ref 0.0–149.0)
VLDL: 11.6 mg/dL (ref 0.0–40.0)

## 2022-03-21 LAB — HEMOGLOBIN A1C: Hgb A1c MFr Bld: 6 % (ref 4.6–6.5)

## 2022-03-21 LAB — BASIC METABOLIC PANEL
BUN: 15 mg/dL (ref 6–23)
CO2: 32 mEq/L (ref 19–32)
Calcium: 9.6 mg/dL (ref 8.4–10.5)
Chloride: 106 mEq/L (ref 96–112)
Creatinine, Ser: 0.73 mg/dL (ref 0.40–1.20)
GFR: 83.7 mL/min (ref >60.00)
Glucose, Bld: 90 mg/dL (ref 70–99)
Potassium: 4.1 mEq/L (ref 3.5–5.1)
Sodium: 143 mEq/L (ref 135–145)

## 2022-03-27 ENCOUNTER — Ambulatory Visit: Payer: Medicare HMO | Admitting: Internal Medicine

## 2022-03-29 ENCOUNTER — Ambulatory Visit (INDEPENDENT_AMBULATORY_CARE_PROVIDER_SITE_OTHER): Payer: Medicare HMO | Admitting: Internal Medicine

## 2022-03-29 ENCOUNTER — Encounter: Payer: Self-pay | Admitting: Internal Medicine

## 2022-03-29 VITALS — BP 132/86 | HR 97 | Temp 98.2°F | Resp 14 | Ht 66.0 in | Wt 241.6 lb

## 2022-03-29 DIAGNOSIS — E7800 Pure hypercholesterolemia, unspecified: Secondary | ICD-10-CM | POA: Diagnosis not present

## 2022-03-29 DIAGNOSIS — F439 Reaction to severe stress, unspecified: Secondary | ICD-10-CM

## 2022-03-29 DIAGNOSIS — R739 Hyperglycemia, unspecified: Secondary | ICD-10-CM | POA: Diagnosis not present

## 2022-03-29 DIAGNOSIS — I700 Atherosclerosis of aorta: Secondary | ICD-10-CM

## 2022-03-29 DIAGNOSIS — E559 Vitamin D deficiency, unspecified: Secondary | ICD-10-CM | POA: Diagnosis not present

## 2022-03-29 DIAGNOSIS — Z1231 Encounter for screening mammogram for malignant neoplasm of breast: Secondary | ICD-10-CM

## 2022-03-29 DIAGNOSIS — R0781 Pleurodynia: Secondary | ICD-10-CM

## 2022-03-29 NOTE — Patient Instructions (Addendum)
YOUR MAMMOGRAM IS DUE 06/22/2022 , PLEASE CALL AND GET THIS SCHEDULED!  A mammogram has been ordered for you.  Please call : [facility] - call [phone]

## 2022-03-29 NOTE — Progress Notes (Unsigned)
Patient ID: Kendalynn Wideman, female   DOB: 02-16-1951, 71 y.o.   MRN: [id number]   Subjective:    Patient ID: [name], female    DOB: 14-Nov-1950, 71 y.o.   MRN: [id number]   Patient here for a scheduled follow up.  Chief Complaint  Patient presents with   Medical Management of Chronic Issues   Hyperlipidemia   .   HPI Here to follow up regarding her cholesterol and blood sugar.  Reports she is doing relatively well.  No chest pain or sob reported.  Bowels moving.  Handling stress.  She has been exercising. Has noticed sore ribs - right > left.  Notices when lying down.  Taking crestor.  Taking 3 days per week - discussed taking daily.  Questioning if crestor could be contributing to the discomfort.  Discussed.  Wants to see if cutting back on exercise - helps.  Will call with update.     Past Medical History:  Diagnosis Date   Complication of anesthesia    Degenerative disc disease, cervical    cervical   Hypercholesterolemia    Meralgia paraesthetica    Migraines    Nephrolithiasis    Neurodermatitis    Obesity    Osteopenia    PONV (postoperative nausea and vomiting)    Vitamin D deficiency    Past Surgical History:  Procedure Laterality Date   CHOLECYSTECTOMY N/A 04/24/2015   Procedure: LAPAROSCOPIC CHOLECYSTECTOMY WITH INTRAOPERATIVE CHOLANGIOGRAM;  Surgeon: Robert Bellow, MD;  Location: [facility];  Service: General;  Laterality: N/A;   EXTRACORPOREAL SHOCK WAVE LITHOTRIPSY Right 11/17/2014   Procedure: EXTRACORPOREAL SHOCK WAVE LITHOTRIPSY (ESWL);  Surgeon: Royston Cowper, MD;  Location: [facility];  Service: Urology;  Laterality: Right;   EXTRACORPOREAL SHOCK WAVE LITHOTRIPSY Left 12/15/2014   Procedure: EXTRACORPOREAL SHOCK WAVE LITHOTRIPSY (ESWL);  Surgeon: Royston Cowper, MD;  Location: [facility];  Service: Urology;  Laterality: Left;   laparoscopic surgery  04/08/1988   nephrolithiasis (Dr Ernst Spell)   LITHOTRIPSY  2010, 1980's   pessary  2003, 2016   TOTAL VAGINAL  HYSTERECTOMY  2016   Family History  Problem Relation Age of Onset   Hypertension Mother    Hypertension Father    Asthma Sister    Hypertension Sister    Diabetes Sister    Breast cancer Sister 57   Rectal cancer Sister    Social History   Socioeconomic History   Marital status: Widowed    Spouse name: Not on file   Number of children: 1   Years of education: Not on file   Highest education level: Not on file  Occupational History   Not on file  Tobacco Use   Smoking status: Never   Smokeless tobacco: Never  Vaping Use   Vaping Use: Never used  Substance and Sexual Activity   Alcohol use: No    Alcohol/week: 0.0 standard drinks of alcohol   Drug use: No   Sexual activity: Not Currently    Birth control/protection: Surgical  Other Topics Concern   Not on file  Social History Narrative   Not on file   Social Determinants of Health   Financial Resource Strain: Low Risk  (08/08/2021)   Overall Financial Resource Strain (CARDIA)    Difficulty of Paying Living Expenses: Not hard at all  Food Insecurity: No Food Insecurity (08/08/2021)   Hunger Vital Sign    Worried About Running Out of Food in the Last Year: Never true    Ran Out  of Food in the Last Year: Never true  Transportation Needs: No Transportation Needs (08/08/2021)   PRAPARE - Hydrologist (Medical): No    Lack of Transportation (Non-Medical): No  Physical Activity: Sufficiently Active (08/08/2021)   Exercise Vital Sign    Days of Exercise per Week: 4 days    Minutes of Exercise per Session: 60 min  Stress: No Stress Concern Present (08/08/2021)   [place]    Feeling of Stress : Not at all  Social Connections: Unknown (08/08/2021)   Social Connection and Isolation Panel [NHANES]    Frequency of Communication with Friends and Family: More than three times a week    Frequency of Social Gatherings with Friends and Family:  More than three times a week    Attends Religious Services: Not on Advertising copywriter or Organizations: Not on file    Attends Archivist Meetings: Not on file    Marital Status: Not on file     Review of Systems  Constitutional:  Negative for appetite change and unexpected weight change.  HENT:  Negative for congestion and sinus pressure.   Respiratory:  Negative for cough, chest tightness and shortness of breath.   Cardiovascular:  Negative for chest pain, palpitations and leg swelling.  Gastrointestinal:  Negative for abdominal pain, diarrhea, nausea and vomiting.  Genitourinary:  Negative for difficulty urinating and dysuria.  Musculoskeletal:  Negative for joint swelling and myalgias.       Rib pain as outlined.   Skin:  Negative for color change and rash.  Neurological:  Negative for dizziness, light-headedness and headaches.  Psychiatric/Behavioral:  Negative for agitation and dysphoric mood.        Objective:     BP 132/86 (BP Location: Left Arm, Patient Position: Sitting, Cuff Size: Large)   Pulse 97   Temp 98.2 F (36.8 C) (Temporal)   Resp 14   Ht _0  (1.676 m)   Wt 241 lb 9.6 oz (109.6 kg)   LMP 09/10/2016 (Exact Date)   SpO2 96%   BMI 39.00 kg/m  Wt Readings from Last 3 Encounters:  03/29/22 241 lb 9.6 oz (109.6 kg)  11/27/21 245 lb 6.4 oz (111.3 kg)  08/16/21 246 lb (111.6 kg)    Physical Exam Vitals reviewed.  Constitutional:      General: She is not in acute distress.    Appearance: Normal appearance.  HENT:     Head: Normocephalic and atraumatic.     Right Ear: External ear normal.     Left Ear: External ear normal.  Eyes:     General: No scleral icterus.       Right eye: No discharge.        Left eye: No discharge.     Conjunctiva/sclera: Conjunctivae normal.  Neck:     Thyroid: No thyromegaly.  Cardiovascular:     Rate and Rhythm: Normal rate and regular rhythm.  Pulmonary:     Effort: No respiratory distress.      Breath sounds: Normal breath sounds. No wheezing.  Abdominal:     General: Bowel sounds are normal.     Palpations: Abdomen is soft.     Tenderness: There is no abdominal tenderness.  Musculoskeletal:        General: No swelling or tenderness.     Cervical back: Neck supple. No tenderness.  Lymphadenopathy:     Cervical: No  cervical adenopathy.  Skin:    Findings: No erythema or rash.  Neurological:     Mental Status: She is alert.  Psychiatric:        Mood and Affect: Mood normal.        Behavior: Behavior normal.      Outpatient Encounter Medications as of 03/29/2022  Medication Sig   Cholecalciferol (VITAMIN D3) 50 MCG (2000 UT) TABS Take by mouth.   rosuvastatin (CRESTOR) 5 MG tablet Take 1 tablet (5 mg total) by mouth daily.   No facility-administered encounter medications on file as of 03/29/2022.     Lab Results  Component Value Date   WBC 5.5 07/24/2021   HGB 13.1 07/24/2021   HCT 40.6 07/24/2021   PLT 267.0 07/24/2021   GLUCOSE 90 03/21/2022   CHOL 145 03/21/2022   TRIG 58.0 03/21/2022   HDL 43.30 03/21/2022   LDLCALC 90 03/21/2022   ALT 14 03/21/2022   AST 19 03/21/2022   NA 143 03/21/2022   K 4.1 03/21/2022   CL 106 03/21/2022   CREATININE 0.73 03/21/2022   BUN 15 03/21/2022   CO2 32 03/21/2022   TSH 2.02 07/24/2021   HGBA1C 6.0 03/21/2022    MM 3D SCREEN BREAST BILATERAL  Result Date: 06/21/2021 CLINICAL DATA:  Screening. EXAM: DIGITAL SCREENING BILATERAL MAMMOGRAM WITH TOMOSYNTHESIS AND CAD TECHNIQUE: Bilateral screening digital craniocaudal and mediolateral oblique mammograms were obtained. Bilateral screening digital breast tomosynthesis was performed. The images were evaluated with computer-aided detection. COMPARISON:  Previous exam(s). ACR Breast Density Category b: There are scattered areas of fibroglandular density. FINDINGS: There are no findings suspicious for malignancy. IMPRESSION: No mammographic evidence of malignancy. A result letter  of this screening mammogram will be mailed directly to the patient. RECOMMENDATION: Screening mammogram in one year. (Code:SM-B-01Y) BI-RADS CATEGORY  1: Negative. Electronically Signed   By: Nolon Nations M.D.   On: 06/21/2021 11:51       Assessment & Plan:   Problem List Items Addressed This Visit     Aortic atherosclerosis ([place])    Continue crestor.       Hyperbilirubinemia    Discussed.  Possible Gilberts.  Recheck liver panel.        Relevant Orders   Hepatic function panel   Hypercholesterolemia    On crestor.  Tolerating.  Cholesterol improved.  Low cholesterol diet and exercise.  Follow lipid panel and liver function tests.   Lab Results  Component Value Date   CHOL 145 03/21/2022   HDL 43.30 03/21/2022   LDLCALC 90 03/21/2022   TRIG 58.0 03/21/2022   CHOLHDL 3 03/21/2022       Relevant Orders   CBC with Differential/Platelet   Hyperglycemia    Low carb diet and exercise.  Follow met b and a1c.   Lab Results  Component Value Date   HGBA1C 6.0 03/21/2022       Rib pain    Rib pain as outlined.  Discussed possible etiologies.  She was questioning if crestor was contributing.  Wants to cut back strenuous exercise.  See if improves.  Call with update over the next couple of week.  If persistent, trial off crestor.        Stress    Overall appears to be handling stress well.  Discussed.  Follow.        Vitamin D deficiency    Continue vitamin D supplements.  Check vitamin D level with next labs.       Relevant Orders  VITAMIN D 25 Hydroxy (Vit-D Deficiency, Fractures)   Other Visit Diagnoses     Encounter for screening mammogram for malignant neoplasm of breast    -  Primary   Relevant Orders   MM 3D SCREEN BREAST BILATERAL       Einar Pheasant, MD

## 2022-04-01 ENCOUNTER — Encounter: Payer: Self-pay | Admitting: Internal Medicine

## 2022-04-01 DIAGNOSIS — R0781 Pleurodynia: Secondary | ICD-10-CM | POA: Insufficient documentation

## 2022-04-01 NOTE — Assessment & Plan Note (Signed)
Continue vitamin D supplements.  Check vitamin D level with next labs.  

## 2022-04-01 NOTE — Assessment & Plan Note (Signed)
Low carb diet and exercise.  Follow met b and a1c.   Lab Results  Component Value Date   HGBA1C 6.0 03/21/2022

## 2022-04-01 NOTE — Assessment & Plan Note (Signed)
On crestor.  Tolerating.  Cholesterol improved.  Low cholesterol diet and exercise.  Follow lipid panel and liver function tests.   Lab Results  Component Value Date   CHOL 145 03/21/2022   HDL 43.30 03/21/2022   LDLCALC 90 03/21/2022   TRIG 58.0 03/21/2022   CHOLHDL 3 03/21/2022

## 2022-04-01 NOTE — Assessment & Plan Note (Signed)
Continue crestor 

## 2022-04-01 NOTE — Assessment & Plan Note (Signed)
Overall appears to be handling stress well.  Discussed.  Follow.

## 2022-04-01 NOTE — Assessment & Plan Note (Signed)
Discussed.  Possible Gilberts.  Recheck liver panel.

## 2022-04-01 NOTE — Assessment & Plan Note (Signed)
Rib pain as outlined.  Discussed possible etiologies.  She was questioning if crestor was contributing.  Wants to cut back strenuous exercise.  See if improves.  Call with update over the next couple of week.  If persistent, trial off crestor.

## 2022-04-23 ENCOUNTER — Other Ambulatory Visit (INDEPENDENT_AMBULATORY_CARE_PROVIDER_SITE_OTHER): Payer: Medicare HMO

## 2022-04-23 DIAGNOSIS — E559 Vitamin D deficiency, unspecified: Secondary | ICD-10-CM | POA: Diagnosis not present

## 2022-04-23 DIAGNOSIS — E7800 Pure hypercholesterolemia, unspecified: Secondary | ICD-10-CM | POA: Diagnosis not present

## 2022-04-23 LAB — CBC WITH DIFFERENTIAL/PLATELET
Basophils Absolute: 0 10*3/uL (ref 0.0–0.1)
Basophils Relative: 0.7 % (ref 0.0–3.0)
Eosinophils Absolute: 0.1 10*3/uL (ref 0.0–0.7)
Eosinophils Relative: 1 % (ref 0.0–5.0)
HCT: 40.7 % (ref 36.0–46.0)
Hemoglobin: 13.1 g/dL (ref 12.0–15.0)
Lymphocytes Relative: 30.5 % (ref 12.0–46.0)
Lymphs Abs: 1.7 10*3/uL (ref 0.7–4.0)
MCHC: 32.1 g/dL (ref 30.0–36.0)
MCV: 91.8 fl (ref 78.0–100.0)
Monocytes Absolute: 0.4 10*3/uL (ref 0.1–1.0)
Monocytes Relative: 7.7 % (ref 3.0–12.0)
Neutro Abs: 3.3 10*3/uL (ref 1.4–7.7)
Neutrophils Relative %: 60.1 % (ref 43.0–77.0)
Platelets: 300 10*3/uL (ref 150.0–400.0)
RBC: 4.43 Mil/uL (ref 3.87–5.11)
RDW: 13.7 % (ref 11.5–15.5)
WBC: 5.4 10*3/uL (ref 4.0–10.5)

## 2022-04-23 LAB — HEPATIC FUNCTION PANEL
ALT: 17 U/L (ref 0–35)
AST: 18 U/L (ref 0–37)
Albumin: 3.9 g/dL (ref 3.5–5.2)
Alkaline Phosphatase: 61 U/L (ref 39–117)
Bilirubin, Direct: 0.1 mg/dL (ref 0.0–0.3)
Total Bilirubin: 0.7 mg/dL (ref 0.2–1.2)
Total Protein: 7.5 g/dL (ref 6.0–8.3)

## 2022-04-23 LAB — VITAMIN D 25 HYDROXY (VIT D DEFICIENCY, FRACTURES): VITD: 32.34 ng/mL (ref 30.00–100.00)

## 2022-04-30 ENCOUNTER — Encounter: Payer: Self-pay | Admitting: Family Medicine

## 2022-05-25 LAB — GLUCOSE, POCT (MANUAL RESULT ENTRY): Glucose Fasting, POC: 111 mg/dL — AB (ref 70–99)

## 2022-05-25 NOTE — Progress Notes (Signed)
Pt is fasting

## 2022-06-03 ENCOUNTER — Encounter: Payer: Self-pay | Admitting: *Deleted

## 2022-06-03 NOTE — Progress Notes (Signed)
Pt has established PCP with whom she has had ongoing care throughout the past rolling 12 months, and w/ whom she has an appt on 07/31/22. 05/25/22 screening event b/p wnl and no SDOH barriers noted as of this event. No additional health equity team support indicated at this time.

## 2022-06-24 ENCOUNTER — Ambulatory Visit
Admission: RE | Admit: 2022-06-24 | Discharge: 2022-06-24 | Disposition: A | Discharge status: Home / Self Care | Payer: Medicare HMO | Source: Ambulatory Visit | Attending: Internal Medicine | Admitting: Internal Medicine

## 2022-06-24 DIAGNOSIS — Z1231 Encounter for screening mammogram for malignant neoplasm of breast: Secondary | ICD-10-CM | POA: Diagnosis not present

## 2022-07-18 ENCOUNTER — Telehealth: Payer: Self-pay | Admitting: Internal Medicine

## 2022-07-18 DIAGNOSIS — E7800 Pure hypercholesterolemia, unspecified: Secondary | ICD-10-CM

## 2022-07-18 DIAGNOSIS — R739 Hyperglycemia, unspecified: Secondary | ICD-10-CM

## 2022-07-18 NOTE — Telephone Encounter (Signed)
Patient has a lab appointment 06/25/2022, there are no orders in. 

## 2022-07-19 NOTE — Telephone Encounter (Signed)
Orders placed for f/u labs.  

## 2022-07-19 NOTE — Addendum Note (Signed)
Addended by: Charm Barges on: 07/19/2022 04:16 AM   Modules accepted: Orders

## 2022-07-26 ENCOUNTER — Other Ambulatory Visit (INDEPENDENT_AMBULATORY_CARE_PROVIDER_SITE_OTHER): Payer: Medicare HMO

## 2022-07-26 DIAGNOSIS — E7800 Pure hypercholesterolemia, unspecified: Secondary | ICD-10-CM | POA: Diagnosis not present

## 2022-07-26 DIAGNOSIS — R739 Hyperglycemia, unspecified: Secondary | ICD-10-CM

## 2022-07-26 LAB — HEPATIC FUNCTION PANEL
ALT: 17 U/L (ref 0–35)
AST: 20 U/L (ref 0–37)
Albumin: 3.9 g/dL (ref 3.5–5.2)
Alkaline Phosphatase: 73 U/L (ref 39–117)
Bilirubin, Direct: 0.2 mg/dL (ref 0.0–0.3)
Total Bilirubin: 0.8 mg/dL (ref 0.2–1.2)
Total Protein: 7 g/dL (ref 6.0–8.3)

## 2022-07-26 LAB — LIPID PANEL
Cholesterol: 137 mg/dL (ref 0–200)
HDL: 41.1 mg/dL (ref >39.00)
LDL Cholesterol: 82 mg/dL (ref 0–99)
NonHDL: 95.58
Total CHOL/HDL Ratio: 3
Triglycerides: 69 mg/dL (ref 0.0–149.0)
VLDL: 13.8 mg/dL (ref 0.0–40.0)

## 2022-07-26 LAB — BASIC METABOLIC PANEL
BUN: 14 mg/dL (ref 6–23)
CO2: 29 mEq/L (ref 19–32)
Calcium: 9.3 mg/dL (ref 8.4–10.5)
Chloride: 105 mEq/L (ref 96–112)
Creatinine, Ser: 0.74 mg/dL (ref 0.40–1.20)
GFR: 82.14 mL/min (ref >60.00)
Glucose, Bld: 90 mg/dL (ref 70–99)
Potassium: 3.8 mEq/L (ref 3.5–5.1)
Sodium: 141 mEq/L (ref 135–145)

## 2022-07-26 LAB — HEMOGLOBIN A1C: Hgb A1c MFr Bld: 5.9 % (ref 4.6–6.5)

## 2022-07-26 LAB — TSH: TSH: 1.91 u[IU]/mL (ref 0.35–5.50)

## 2022-07-31 ENCOUNTER — Ambulatory Visit (INDEPENDENT_AMBULATORY_CARE_PROVIDER_SITE_OTHER): Payer: Medicare HMO | Admitting: Internal Medicine

## 2022-07-31 ENCOUNTER — Encounter: Payer: Self-pay | Admitting: Internal Medicine

## 2022-07-31 ENCOUNTER — Encounter: Payer: Medicare HMO | Admitting: Internal Medicine

## 2022-07-31 VITALS — BP 128/80 | HR 69 | Temp 98.0°F | Ht 66.0 in | Wt 237.2 lb

## 2022-07-31 DIAGNOSIS — Z1211 Encounter for screening for malignant neoplasm of colon: Secondary | ICD-10-CM | POA: Diagnosis not present

## 2022-07-31 DIAGNOSIS — Z4689 Encounter for fitting and adjustment of other specified devices: Secondary | ICD-10-CM | POA: Diagnosis not present

## 2022-07-31 DIAGNOSIS — I700 Atherosclerosis of aorta: Secondary | ICD-10-CM

## 2022-07-31 DIAGNOSIS — F439 Reaction to severe stress, unspecified: Secondary | ICD-10-CM

## 2022-07-31 DIAGNOSIS — E7800 Pure hypercholesterolemia, unspecified: Secondary | ICD-10-CM

## 2022-07-31 DIAGNOSIS — R739 Hyperglycemia, unspecified: Secondary | ICD-10-CM

## 2022-07-31 DIAGNOSIS — N8110 Cystocele, unspecified: Secondary | ICD-10-CM

## 2022-07-31 DIAGNOSIS — Z0000 Encounter for general adult medical examination without abnormal findings: Secondary | ICD-10-CM | POA: Diagnosis not present

## 2022-07-31 MED ORDER — ROSUVASTATIN CALCIUM 5 MG PO TABS: 5.0000 mg | ORAL_TABLET | Freq: Every day | ORAL | 1 refills | Status: DC

## 2022-07-31 NOTE — Assessment & Plan Note (Addendum)
Physical today 07/31/22.  Referred back to gyn for vaginal exam/pessary maintenance.   Mammogram 06/24/22 - Birads I.  Colonoscopy 07/2012.  Due colonoscopy. Refer back to GI.

## 2022-07-31 NOTE — Patient Instructions (Signed)
Your Annual Medicare Wellness visit in due on 08/09/2022, please schedule this at checkout.

## 2022-07-31 NOTE — Progress Notes (Signed)
Subjective:    [name], female    DOB: 1950-11-16, 72 y.o.   MRN: [id number]  Patient here for  Chief Complaint  Patient presents with   Medical Management of Chronic Issues    HPI Here for physical exam.  Reports she is doing well.  Exercising.  No chest pain or sob reported.  No cough or congestion.  No abdominal pain or bowel change.  Discussed GYN follow up - regarding her pessary.  Also, discussed due colonoscopy.  States blood pressures averaging 120-124/70s.     Past Medical History:  Diagnosis Date   Complication of anesthesia    Degenerative disc disease, cervical    cervical   Hypercholesterolemia    Meralgia paraesthetica    Migraines    Nephrolithiasis    Neurodermatitis    Obesity    Osteopenia    PONV (postoperative nausea and vomiting)    Vitamin D deficiency    Past Surgical History:  Procedure Laterality Date   CHOLECYSTECTOMY N/A 04/24/2015   Procedure: LAPAROSCOPIC CHOLECYSTECTOMY WITH INTRAOPERATIVE CHOLANGIOGRAM;  Surgeon: Earline Mayotte, MD;  Location: [facility];  Service: General;  Laterality: N/A;   EXTRACORPOREAL SHOCK WAVE LITHOTRIPSY Right 11/17/2014   Procedure: EXTRACORPOREAL SHOCK WAVE LITHOTRIPSY (ESWL);  Surgeon: Orson Ape, MD;  Location: [facility];  Service: Urology;  Laterality: Right;   EXTRACORPOREAL SHOCK WAVE LITHOTRIPSY Left 12/15/2014   Procedure: EXTRACORPOREAL SHOCK WAVE LITHOTRIPSY (ESWL);  Surgeon: Orson Ape, MD;  Location: [facility];  Service: Urology;  Laterality: Left;   laparoscopic surgery  04/08/1988   nephrolithiasis (Dr Orson Slick)   LITHOTRIPSY  2010, 1980's   pessary  2003, 2016   TOTAL VAGINAL HYSTERECTOMY  2016   Family History  Problem Relation Age of Onset   Hypertension Mother    Hypertension Father    Asthma Sister    Hypertension Sister    Diabetes Sister    Breast cancer Sister 35   Rectal cancer Sister    Social History   Socioeconomic History   Marital status: Widowed     Spouse name: Not on file   Number of children: 1   Years of education: Not on file   Highest education level: Not on file  Occupational History   Not on file  Tobacco Use   Smoking status: Never   Smokeless tobacco: Never  Vaping Use   Vaping Use: Never used  Substance and Sexual Activity   Alcohol use: No    Alcohol/week: 0.0 standard drinks of alcohol   Drug use: No   Sexual activity: Not Currently    Birth control/protection: Surgical  Other Topics Concern   Not on file  Social History Narrative   Not on file   Social Determinants of Health   Financial Resource Strain: Low Risk  (08/08/2021)   Overall Financial Resource Strain (CARDIA)    Difficulty of Paying Living Expenses: Not hard at all  Food Insecurity: No Food Insecurity (05/25/2022)   Hunger Vital Sign    Worried About Running Out of Food in the Last Year: Never true    Ran Out of Food in the Last Year: Never true  Transportation Needs: No Transportation Needs (08/08/2021)   PRAPARE - Administrator, Civil Service (Medical): No    Lack of Transportation (Non-Medical): No  Physical Activity: Sufficiently Active (08/08/2021)   Exercise Vital Sign    Days of Exercise per Week: 4 days    Minutes of Exercise per  Session: 60 min  Stress: No Stress Concern Present (08/08/2021)   [employer] of Occupational Health - Occupational Stress Questionnaire    Feeling of Stress : Not at all  Social Connections: Unknown (08/08/2021)   Social Connection and Isolation Panel [NHANES]    Frequency of Communication with Friends and Family: More than three times a week    Frequency of Social Gatherings with Friends and Family: More than three times a week    Attends Religious Services: Not on Marketing executive or Organizations: Not on file    Attends Banker Meetings: Not on file    Marital Status: Not on file     Review of Systems  Constitutional:  Negative for appetite change and unexpected  weight change.  HENT:  Negative for congestion, sinus pressure and sore throat.   Eyes:  Negative for pain and visual disturbance.  Respiratory:  Negative for cough, chest tightness and shortness of breath.   Cardiovascular:  Negative for chest pain, palpitations and leg swelling.  Gastrointestinal:  Negative for abdominal pain, diarrhea, nausea and vomiting.  Genitourinary:  Negative for difficulty urinating and dysuria.  Musculoskeletal:  Negative for joint swelling and myalgias.  Skin:  Negative for color change and rash.  Neurological:  Negative for dizziness and headaches.  Hematological:  Negative for adenopathy. Does not bruise/bleed easily.  Psychiatric/Behavioral:  Negative for agitation, decreased concentration and dysphoric mood.        Objective:     BP 128/80   Pulse 69   Temp 98 F (36.7 C) (Oral)   Ht 5\' 6"  (1.676 m)   Wt 237 lb 3.2 oz (107.6 kg)   LMP 09/10/2016 (Exact Date)   SpO2 96%   BMI 38.29 kg/m  Wt Readings from Last 3 Encounters:  07/31/22 237 lb 3.2 oz (107.6 kg)  03/29/22 241 lb 9.6 oz (109.6 kg)  11/27/21 245 lb 6.4 oz (111.3 kg)    Physical Exam Vitals reviewed.  Constitutional:      General: She is not in acute distress.    Appearance: Normal appearance. She is well-developed.  HENT:     Head: Normocephalic and atraumatic.     Right Ear: External ear normal.     Left Ear: External ear normal.  Eyes:     General: No scleral icterus.       Right eye: No discharge.        Left eye: No discharge.     Conjunctiva/sclera: Conjunctivae normal.  Neck:     Thyroid: No thyromegaly.  Cardiovascular:     Rate and Rhythm: Normal rate and regular rhythm.  Pulmonary:     Effort: No tachypnea, accessory muscle usage or respiratory distress.     Breath sounds: Normal breath sounds. No decreased breath sounds or wheezing.  Chest:  Breasts:    Right: No inverted nipple, mass, nipple discharge or tenderness (no axillary adenopathy).     Left: No  inverted nipple, mass, nipple discharge or tenderness (no axilarry adenopathy).  Abdominal:     General: Bowel sounds are normal.     Palpations: Abdomen is soft.     Tenderness: There is no abdominal tenderness.  Musculoskeletal:        General: No swelling or tenderness.     Cervical back: Neck supple.  Lymphadenopathy:     Cervical: No cervical adenopathy.  Skin:    Findings: No erythema or rash.  Neurological:     Mental Status:  She is alert and oriented to person, place, and time.  Psychiatric:        Mood and Affect: Mood normal.        Behavior: Behavior normal.      Outpatient Encounter Medications as of 07/31/2022  Medication Sig   Cholecalciferol (VITAMIN D3) 50 MCG (2000 UT) TABS Take by mouth.   [DISCONTINUED] rosuvastatin (CRESTOR) 5 MG tablet Take 1 tablet (5 mg total) by mouth daily.   rosuvastatin (CRESTOR) 5 MG tablet Take 1 tablet (5 mg total) by mouth daily.   No facility-administered encounter medications on file as of 07/31/2022.     Lab Results  Component Value Date   WBC 5.4 04/23/2022   HGB 13.1 04/23/2022   HCT 40.7 04/23/2022   PLT 300.0 04/23/2022   GLUCOSE 90 07/26/2022   CHOL 137 07/26/2022   TRIG 69.0 07/26/2022   HDL 41.10 07/26/2022   LDLCALC 82 07/26/2022   ALT 17 07/26/2022   AST 20 07/26/2022   NA 141 07/26/2022   K 3.8 07/26/2022   CL 105 07/26/2022   CREATININE 0.74 07/26/2022   BUN 14 07/26/2022   CO2 29 07/26/2022   TSH 1.91 07/26/2022   HGBA1C 5.9 07/26/2022    MM 3D SCREEN BREAST BILATERAL  Result Date: 06/25/2022 CLINICAL DATA:  Screening. EXAM: DIGITAL SCREENING BILATERAL MAMMOGRAM WITH TOMOSYNTHESIS AND CAD TECHNIQUE: Bilateral screening digital craniocaudal and mediolateral oblique mammograms were obtained. Bilateral screening digital breast tomosynthesis was performed. The images were evaluated with computer-aided detection. COMPARISON:  Previous exam(s). ACR Breast Density Category a: The breasts are almost entirely  fatty. FINDINGS: There are no findings suspicious for malignancy. IMPRESSION: No mammographic evidence of malignancy. A result letter of this screening mammogram will be mailed directly to the patient. RECOMMENDATION: Screening mammogram in one year. (Code:SM-B-01Y) BI-RADS CATEGORY  1: Negative. Electronically Signed   By: Sande Brothers M.D.   On: 06/25/2022 14:04       Assessment & Plan:  Routine general medical examination at a health care facility  Health care maintenance Assessment & Plan: Physical today 07/31/22.  Referred back to gyn for vaginal exam/pessary maintenance.   Mammogram 06/24/22 - Birads I.  Colonoscopy 07/2012.  Due colonoscopy. Refer back to GI.    Hypercholesterolemia Assessment & Plan: On crestor.  Tolerating.  Cholesterol improved.  Low cholesterol diet and exercise.  Follow lipid panel and liver function tests.   Lab Results  Component Value Date   CHOL 137 07/26/2022   HDL 41.10 07/26/2022   LDLCALC 82 07/26/2022   TRIG 69.0 07/26/2022   CHOLHDL 3 07/26/2022    Orders: -     Rosuvastatin Calcium; Take 1 tablet (5 mg total) by mouth daily.  Dispense: 90 tablet; Refill: 1 -     Lipid panel; Future -     Hepatic function panel; Future -     Basic metabolic panel; Future  Colon cancer screening -     Ambulatory referral to Gastroenterology  Pessary maintenance -     Ambulatory referral to Gynecology  Aortic atherosclerosis [facility]) Assessment & Plan: Continue crestor.    Hyperglycemia Assessment & Plan: Low carb diet and exercise.  Follow met b and a1c.   Lab Results  Component Value Date   HGBA1C 5.9 07/26/2022     Stress Assessment & Plan: Overall appears to be handling stress well.  Discussed.  Follow.     Vaginal prolapse Assessment & Plan: Has pessary.  Was followed by Dr Jerene Pitch.  Needs to establish with gyn.  Request referral to [facility].       Dale Aristes, MD

## 2022-08-04 ENCOUNTER — Encounter: Payer: Self-pay | Admitting: Internal Medicine

## 2022-08-04 NOTE — Assessment & Plan Note (Signed)
Has pessary.  Was followed by Dr Jerene Pitch.  Needs to establish with gyn.  Request referral to [facility].

## 2022-08-04 NOTE — Assessment & Plan Note (Signed)
Continue crestor 

## 2022-08-04 NOTE — Assessment & Plan Note (Signed)
Low carb diet and exercise.  Follow met b and a1c.   Lab Results  Component Value Date   HGBA1C 5.9 07/26/2022

## 2022-08-04 NOTE — Assessment & Plan Note (Signed)
On crestor.  Tolerating.  Cholesterol improved.  Low cholesterol diet and exercise.  Follow lipid panel and liver function tests.   Lab Results  Component Value Date   CHOL 137 07/26/2022   HDL 41.10 07/26/2022   LDLCALC 82 07/26/2022   TRIG 69.0 07/26/2022   CHOLHDL 3 07/26/2022

## 2022-08-04 NOTE — Assessment & Plan Note (Signed)
Overall appears to be handling stress well.  Discussed.  Follow.   

## 2022-08-12 ENCOUNTER — Ambulatory Visit (INDEPENDENT_AMBULATORY_CARE_PROVIDER_SITE_OTHER): Payer: Medicare HMO

## 2022-08-12 VITALS — Wt 237.0 lb

## 2022-08-12 DIAGNOSIS — Z0000 Encounter for general adult medical examination without abnormal findings: Secondary | ICD-10-CM

## 2022-08-12 NOTE — Progress Notes (Signed)
Subjective:   Nicole Hardy is a 72 y.o. female who presents for Medicare Annual (Subsequent) preventive examination.  Review of Systems    I connected with  [name] on 08/12/22 by a audio enabled telemedicine application and verified that I am speaking with the correct person using two identifiers.  Patient Location: Home  Provider Location: Home Office  I discussed the limitations of evaluation and management by telemedicine. The patient expressed understanding and agreed to proceed.  Cardiac Risk Factors include: advanced age (>46men, >76 women)     Objective:    Today's Vitals   08/12/22 1331  Weight: 237 lb (107.5 kg)   Body mass index is 38.25 kg/m.     08/08/2021    2:48 PM 08/04/2020   12:38 PM 08/04/2019   11:42 AM 08/03/2018    1:40 PM 04/24/2015    9:31 AM 04/18/2015    9:11 AM 11/17/2014    6:51 AM  Advanced Directives  Does Patient Have a Medical Advance Directive? Yes Yes No No No Yes;No No  Type of Estate agent of [employer] Power of [place];Living will       Does patient want to make changes to medical advance directive? No - Patient declined No - Patient declined No - Patient declined   No - Patient declined   Copy of Healthcare Power of Attorney in Chart? No - copy requested No - copy requested       Would patient like information on creating a medical advance directive?    No - Patient declined   No - patient declined information    Current Medications (verified) Outpatient Encounter Medications as of 08/12/2022  Medication Sig   Cholecalciferol (VITAMIN D3) 50 MCG (2000 UT) TABS Take by mouth.   rosuvastatin (CRESTOR) 5 MG tablet Take 1 tablet (5 mg total) by mouth daily.   No facility-administered encounter medications on file as of 08/12/2022.    Allergies (verified) Metronidazole   History: Past Medical History:  Diagnosis Date   Complication of anesthesia    Degenerative disc disease, cervical    cervical    Hypercholesterolemia    Meralgia paraesthetica    Migraines    Nephrolithiasis    Neurodermatitis    Obesity    Osteopenia    PONV (postoperative nausea and vomiting)    Vitamin D deficiency    Past Surgical History:  Procedure Laterality Date   CHOLECYSTECTOMY N/A 04/24/2015   Procedure: LAPAROSCOPIC CHOLECYSTECTOMY WITH INTRAOPERATIVE CHOLANGIOGRAM;  Surgeon: Earline Mayotte, MD;  Location: [facility] ORS;  Service: General;  Laterality: N/A;   EXTRACORPOREAL SHOCK WAVE LITHOTRIPSY Right 11/17/2014   Procedure: EXTRACORPOREAL SHOCK WAVE LITHOTRIPSY (ESWL);  Surgeon: Orson Ape, MD;  Location: [facility] ORS;  Service: Urology;  Laterality: Right;   EXTRACORPOREAL SHOCK WAVE LITHOTRIPSY Left 12/15/2014   Procedure: EXTRACORPOREAL SHOCK WAVE LITHOTRIPSY (ESWL);  Surgeon: Orson Ape, MD;  Location: [facility] ORS;  Service: Urology;  Laterality: Left;   laparoscopic surgery  04/08/1988   nephrolithiasis (Dr Orson Slick)   LITHOTRIPSY  2010, 1980's   pessary  2003, 2016   TOTAL VAGINAL HYSTERECTOMY  2016   Family History  Problem Relation Age of Onset   Hypertension Mother    Hypertension Father    Asthma Sister    Hypertension Sister    Diabetes Sister    Breast cancer Sister 22   Rectal cancer Sister    Social History   Socioeconomic History   Marital status: Widowed  Spouse name: Not on file   Number of children: 1   Years of education: Not on file   Highest education level: Not on file  Occupational History   Not on file  Tobacco Use   Smoking status: Never   Smokeless tobacco: Never  Vaping Use   Vaping Use: Never used  Substance and Sexual Activity   Alcohol use: No    Alcohol/week: 0.0 standard drinks of alcohol   Drug use: No   Sexual activity: Not Currently    Birth control/protection: Surgical  Other Topics Concern   Not on file  Social History Narrative   Not on file   Social Determinants of Health   Financial Resource Strain: Low Risk  (08/12/2022)    Overall Financial Resource Strain (CARDIA)    Difficulty of Paying Living Expenses: Not hard at all  Food Insecurity: No Food Insecurity (08/12/2022)   Hunger Vital Sign    Worried About Running Out of Food in the Last Year: Never true    Ran Out of Food in the Last Year: Never true  Transportation Needs: No Transportation Needs (08/12/2022)   PRAPARE - Administrator, Civil Service (Medical): No    Lack of Transportation (Non-Medical): No  Physical Activity: Sufficiently Active (08/12/2022)   Exercise Vital Sign    Days of Exercise per Week: 7 days    Minutes of Exercise per Session: 30 min  Stress: No Stress Concern Present (08/12/2022)   [employer] of Occupational Health - Occupational Stress Questionnaire    Feeling of Stress : Not at all  Social Connections: Moderately Integrated (08/12/2022)   Social Connection and Isolation Panel [NHANES]    Frequency of Communication with Friends and Family: More than three times a week    Frequency of Social Gatherings with Friends and Family: More than three times a week    Attends Religious Services: More than 4 times per year    Active Member of [employer] or Organizations: Yes    Attends Banker Meetings: More than 4 times per year    Marital Status: Widowed    Tobacco Counseling Counseling given: Yes   Clinical Intake:  Pre-visit preparation completed: Yes  Pain : No/denies pain     BMI - recorded: 38.25 Nutritional Status: BMI > 30  Obese Nutritional Risks: None Diabetes: No  How often do you need to have someone help you when you read instructions, pamphlets, or other written materials from your doctor or pharmacy?: 1 - Never  Diabetic?no   Interpreter Needed?: No  Information entered by :: Fredirick Maudlin   Activities of Daily Living    08/12/2022    1:42 PM  In your present state of health, do you have any difficulty performing the following activities:  Hearing? 0  Vision? 0   Difficulty concentrating or making decisions? 0  Walking or climbing stairs? 0  Dressing or bathing? 0  Doing errands, shopping? 0  Preparing Food and eating ? N  Using the Toilet? N  In the past six months, have you accidently leaked urine? N  Do you have problems with loss of bowel control? N  Managing your Medications? N  Managing your Finances? N  Housekeeping or managing your Housekeeping? N    Patient Care Team: Dale Robin [place], MD as PCP - General (Internal Medicine) Lemar Livings Merrily Pew, MD as Consulting Physician (General Surgery) Dale Dowell, MD as Referring Physician (Internal Medicine)  Indicate any recent Medical Services you may have  received from other than [facility] providers in the past year (date may be approximate).     Assessment:   This is a routine wellness examination for [place].  Hearing/Vision screen Hearing Screening - Comments:: Denies hearing difficulties    Vision Screening - Comments:: Wears rx glasses - up to date with routine eye exams with  [facility]   Dietary issues and exercise activities discussed: Current Exercise Habits: Structured exercise class, Type of exercise: stretching;Other - see comments (weights), Time (Minutes): 20, Frequency (Times/Week): 4, Weekly Exercise (Minutes/Week): 80, Intensity: Moderate   Goals Addressed               This Visit's Progress     Patient Stated     Maintain Healthy Lifestyle (pt-stated)   On track     Stay active. Healthy diet.       Depression Screen    08/12/2022    1:37 PM 07/31/2022   11:28 AM 03/29/2022    7:57 AM 11/27/2021   10:00 AM 08/08/2021    2:54 PM 07/26/2021    9:10 AM 08/04/2020   12:18 PM  PHQ 2/9 Scores  PHQ - 2 Score 0 0 0 0 0 0 0    Fall Risk    07/31/2022   11:28 AM 03/29/2022    7:57 AM 11/27/2021   10:00 AM 08/08/2021    3:02 PM 07/26/2021    9:09 AM  Fall Risk   Falls in the past year? 0 0 0 0 0  Number falls in past yr: 0 0 0 0   Injury with Fall? 0 0 0 0    Risk for fall due to : No Fall Risks No Fall Risks No Fall Risks  No Fall Risks  Follow up Falls evaluation completed Falls evaluation completed Falls evaluation completed Falls evaluation completed Falls evaluation completed    FALL RISK PREVENTION PERTAINING TO THE HOME:  Any stairs in or around the home? Yes  If so, are there any without handrails? No  Home free of loose throw rugs in walkways, pet beds, electrical cords, etc? No  Adequate lighting in your home to reduce risk of falls? No   ASSISTIVE DEVICES UTILIZED TO PREVENT FALLS:  Life alert? No  Use of a cane, walker or w/c? No  Grab bars in the bathroom? Yes  Shower chair or bench in shower? No  Elevated toilet seat or a handicapped toilet? No   TIMED UP AND GO:  Was the test performed? No Televisit.  Cognitive Function:        08/12/2022    1:38 PM 08/08/2021    3:06 PM 08/04/2019   12:15 PM 08/03/2018    1:42 PM  6CIT Screen  What Year? 0 points 0 points 0 points 0 points  What month? 0 points 0 points 0 points 0 points  What time? 0 points 0 points 0 points 0 points  Count back from 20 0 points 0 points  0 points  Months in reverse 0 points 0 points  0 points  Repeat phrase  0 points  0 points  Total Score  0 points  0 points    Immunizations Immunization History  Administered Date(s) Administered   PFIZER(Purple Top)SARS-COV-2 Vaccination 08/03/2019, 08/23/2019, 04/03/2020   PPD Test 12/25/2015, 11/20/2016, 12/31/2021   Pfizer Covid-19 Vaccine Bivalent Booster 39yrs & up 03/16/2021    TDAP status: Due, Education has been provided regarding the importance of this vaccine. Advised may receive this vaccine at  local pharmacy or Health Dept. Aware to provide a copy of the vaccination record if obtained from local pharmacy or Health Dept. Verbalized acceptance and understanding.  Flu Vaccine status: Due, Education has been provided regarding the importance of this vaccine. Advised may receive this vaccine at  local pharmacy or Health Dept. Aware to provide a copy of the vaccination record if obtained from local pharmacy or Health Dept. Verbalized acceptance and understanding.  Pneumococcal vaccine status: Due, Education has been provided regarding the importance of this vaccine. Advised may receive this vaccine at local pharmacy or Health Dept. Aware to provide a copy of the vaccination record if obtained from local pharmacy or Health Dept. Verbalized acceptance and understanding.  Covid-19 vaccine status: Information provided on how to obtain vaccines.   Qualifies for Shingles Vaccine? Yes   Zostavax completed No   Shingrix Completed?: No.    Education has been provided regarding the importance of this vaccine. Patient has been advised to call insurance company to determine out of pocket expense if they have not yet received this vaccine. Advised may also receive vaccine at local pharmacy or Health Dept. Verbalized acceptance and understanding.  Screening Tests Health Maintenance  Topic Date Due   COLONOSCOPY (Pts 45-68yrs Insurance coverage will need to be confirmed)  07/24/2022   COVID-19 Vaccine (5 - 2023-24 season) 08/16/2022 (Originally 12/07/2021)   Zoster Vaccines- Shingrix (1 of 2) 10/30/2022 (Originally 06/09/2002)   Pneumonia Vaccine 37+ Years old (1 of 1 - PCV) 03/30/2023 (Originally 06/08/2017)   INFLUENZA VACCINE  11/07/2022   Medicare Annual Wellness (AWV)  08/12/2023   MAMMOGRAM  06/23/2024   DEXA SCAN  Completed   Hepatitis C Screening  Completed   HPV VACCINES  Aged Out   DTaP/Tdap/Td  Discontinued    Health Maintenance  Health Maintenance Due  Topic Date Due   COLONOSCOPY (Pts 45-58yrs Insurance coverage will need to be confirmed)  07/24/2022    Colorectal cancer screening: Type of screening: Colonoscopy. Completed 07/23/2012. Repeat every 5 years  Mammogram status: Completed 06/24/2022. Repeat every year  Bone Density status: Completed 07/26/2022. Results reflect: Bone  density results: OSTEOPOROSIS. Repeat every 5 years.  Lung Cancer Screening: (Low Dose CT Chest recommended if Age 64-80 years, 30 pack-year currently smoking OR have quit w/in 15years.) does not qualify.    Additional Screening:  Hepatitis C Screening: does qualify; Completed 12/10/2019  Vision Screening: Recommended annual ophthalmology exams for early detection of glaucoma and other disorders of the eye. Is the patient up to date with their annual eye exam?  Yes  Who is the provider or what is the name of the office in which the patient attends annual eye exams? Dr Marti Sleigh If pt is not established with a provider, would they like to be referred to a provider to establish care? No .   Dental Screening: Recommended annual dental exams for proper oral hygiene  Community Resource Referral / Chronic Care Management: CRR required this visit?  No   CCM required this visit?  No      Plan:     I have personally reviewed and noted the following in the patient's chart:   Medical and social history Use of alcohol, tobacco or illicit drugs  Current medications and supplements including opioid prescriptions. Patient is not currently taking opioid prescriptions. Functional ability and status Nutritional status Physical activity Advanced directives List of other physicians Hospitalizations, surgeries, and ER visits in previous 12 months Vitals Screenings to include cognitive, depression, and falls  Referrals and appointments  In addition, I have reviewed and discussed with patient certain preventive protocols, quality metrics, and best practice recommendations. A written personalized care plan for preventive services as well as general preventive health recommendations were provided to patient.     Annabell Sabal, CMA   08/12/2022   Nurse Notes: None

## 2022-08-12 NOTE — Patient Instructions (Signed)
[name] , Thank you for taking time to come for your Medicare Wellness Visit. I appreciate your ongoing commitment to your health goals. Please review the following plan we discussed and let me know if I can assist you in the future.   These are the goals we discussed:  Goals       Patient Stated     Maintain Healthy Lifestyle (pt-stated)      Stay active. Healthy diet.       Weight Loss (pt-stated)      Patient Goals/Self-Care Activities Over the next 90 days, patient will:  - take medications as prescribed target a minimum of 150 minutes of moderate intensity exercise weekly engage in dietary modifications by moderating carbohydrate portion sizes        This is a list of the screening recommended for you and due dates:  Health Maintenance  Topic Date Due   Colon Cancer Screening  07/24/2022   COVID-19 Vaccine (5 - 2023-24 season) 08/16/2022*   Zoster (Shingles) Vaccine (1 of 2) 10/30/2022*   Pneumonia Vaccine (1 of 1 - PCV) 03/30/2023*   Flu Shot  11/07/2022   Medicare Annual Wellness Visit  08/12/2023   Mammogram  06/23/2024   DEXA scan (bone density measurement)  Completed   Hepatitis C Screening: USPSTF Recommendation to screen - Ages 5-79 yo.  Completed   HPV Vaccine  Aged Out   DTaP/Tdap/Td vaccine  Discontinued  *Topic was postponed. The date shown is not the original due date.    Advanced directives: Please bring a copy of your health care power of attorney and living will to the office to be added to your chart at your convenience.   Conditions/risks identified: Aim for 30 minutes of exercise or brisk walking, 6-8 glasses of water, and 5 servings of fruits and vegetables each day.   Next appointment: Follow up in one year for your annual wellness visit    Preventive Care 65 Years and Older, Female Preventive care refers to lifestyle choices and visits with your health care provider that can promote health and wellness. What does preventive care  include? A yearly physical exam. This is also called an annual well check. Dental exams once or twice a year. Routine eye exams. Ask your health care provider how often you should have your eyes checked. Personal lifestyle choices, including: Daily care of your teeth and gums. Regular physical activity. Eating a healthy diet. Avoiding tobacco and drug use. Limiting alcohol use. Practicing safe sex. Taking low-dose aspirin every day. Taking vitamin and mineral supplements as recommended by your health care provider. What happens during an annual well check? The services and screenings done by your health care provider during your annual well check will depend on your age, overall health, lifestyle risk factors, and family history of disease. Counseling  Your health care provider may ask you questions about your: Alcohol use. Tobacco use. Drug use. Emotional well-being. Home and relationship well-being. Sexual activity. Eating habits. History of falls. Memory and ability to understand (cognition). Work and work Astronomer. Reproductive health. Screening  You may have the following tests or measurements: Height, weight, and BMI. Blood pressure. Lipid and cholesterol levels. These may be checked every 5 years, or more frequently if you are over 34 years old. Skin check. Lung cancer screening. You may have this screening every year starting at age 3 if you have a 30-pack-year history of smoking and currently smoke or have quit within the past 15 years. Fecal occult  blood test (FOBT) of the stool. You may have this test every year starting at age 68. Flexible sigmoidoscopy or colonoscopy. You may have a sigmoidoscopy every 5 years or a colonoscopy every 10 years starting at age 63. Hepatitis C blood test. Hepatitis B blood test. Sexually transmitted disease (STD) testing. Diabetes screening. This is done by checking your blood sugar (glucose) after you have not eaten for a while  (fasting). You may have this done every 1-3 years. Bone density scan. This is done to screen for osteoporosis. You may have this done starting at age 68. Mammogram. This may be done every 1-2 years. Talk to your health care provider about how often you should have regular mammograms. Talk with your health care provider about your test results, treatment options, and if necessary, the need for more tests. Vaccines  Your health care provider may recommend certain vaccines, such as: Influenza vaccine. This is recommended every year. Tetanus, diphtheria, and acellular pertussis (Tdap, Td) vaccine. You may need a Td booster every 10 years. Zoster vaccine. You may need this after age 9. Pneumococcal 13-valent conjugate (PCV13) vaccine. One dose is recommended after age 41. Pneumococcal polysaccharide (PPSV23) vaccine. One dose is recommended after age 50. Talk to your health care provider about which screenings and vaccines you need and how often you need them. This information is not intended to replace advice given to you by your health care provider. Make sure you discuss any questions you have with your health care provider. Document Released: 04/21/2015 Document Revised: 12/13/2015 Document Reviewed: 01/24/2015 Elsevier Interactive Patient Education  2017 [employer].  Fall Prevention in the Home Falls can cause injuries. They can happen to people of all ages. There are many things you can do to make your home safe and to help prevent falls. What can I do on the outside of my home? Regularly fix the edges of walkways and driveways and fix any cracks. Remove anything that might make you trip as you walk through a door, such as a raised step or threshold. Trim any bushes or trees on the path to your home. Use bright outdoor lighting. Clear any walking paths of anything that might make someone trip, such as rocks or tools. Regularly check to see if handrails are loose or broken. Make sure that  both sides of any steps have handrails. Any raised decks and porches should have guardrails on the edges. Have any leaves, snow, or ice cleared regularly. Use sand or salt on walking paths during winter. Clean up any spills in your garage right away. This includes oil or grease spills. What can I do in the bathroom? Use night lights. Install grab bars by the toilet and in the tub and shower. Do not use towel bars as grab bars. Use non-skid mats or decals in the tub or shower. If you need to sit down in the shower, use a plastic, non-slip stool. Keep the floor dry. Clean up any water that spills on the floor as soon as it happens. Remove soap buildup in the tub or shower regularly. Attach bath mats securely with double-sided non-slip rug tape. Do not have throw rugs and other things on the floor that can make you trip. What can I do in the bedroom? Use night lights. Make sure that you have a light by your bed that is easy to reach. Do not use any sheets or blankets that are too big for your bed. They should not hang down onto the floor.  Have a firm chair that has side arms. You can use this for support while you get dressed. Do not have throw rugs and other things on the floor that can make you trip. What can I do in the kitchen? Clean up any spills right away. Avoid walking on wet floors. Keep items that you use a lot in easy-to-reach places. If you need to reach something above you, use a strong step stool that has a grab bar. Keep electrical cords out of the way. Do not use floor polish or wax that makes floors slippery. If you must use wax, use non-skid floor wax. Do not have throw rugs and other things on the floor that can make you trip. What can I do with my stairs? Do not leave any items on the stairs. Make sure that there are handrails on both sides of the stairs and use them. Fix handrails that are broken or loose. Make sure that handrails are as long as the stairways. Check  any carpeting to make sure that it is firmly attached to the stairs. Fix any carpet that is loose or worn. Avoid having throw rugs at the top or bottom of the stairs. If you do have throw rugs, attach them to the floor with carpet tape. Make sure that you have a light switch at the top of the stairs and the bottom of the stairs. If you do not have them, ask someone to add them for you. What else can I do to help prevent falls? Wear shoes that: Do not have high heels. Have rubber bottoms. Are comfortable and fit you well. Are closed at the toe. Do not wear sandals. If you use a stepladder: Make sure that it is fully opened. Do not climb a closed stepladder. Make sure that both sides of the stepladder are locked into place. Ask someone to hold it for you, if possible. Clearly mark and make sure that you can see: Any grab bars or handrails. First and last steps. Where the edge of each step is. Use tools that help you move around (mobility aids) if they are needed. These include: Canes. Walkers. Scooters. Crutches. Turn on the lights when you go into a dark area. Replace any light bulbs as soon as they burn out. Set up your furniture so you have a clear path. Avoid moving your furniture around. If any of your floors are uneven, fix them. If there are any pets around you, be aware of where they are. Review your medicines with your doctor. Some medicines can make you feel dizzy. This can increase your chance of falling. Ask your doctor what other things that you can do to help prevent falls. This information is not intended to replace advice given to you by your health care provider. Make sure you discuss any questions you have with your health care provider. Document Released: 01/19/2009 Document Revised: 08/31/2015 Document Reviewed: 04/29/2014 Elsevier Interactive Patient Education  2017 [employer].

## 2022-08-23 ENCOUNTER — Telehealth: Payer: Self-pay | Admitting: Internal Medicine

## 2022-08-23 ENCOUNTER — Ambulatory Visit (INDEPENDENT_AMBULATORY_CARE_PROVIDER_SITE_OTHER): Payer: Medicare HMO | Admitting: Nurse Practitioner

## 2022-08-23 ENCOUNTER — Encounter: Payer: Self-pay | Admitting: Nurse Practitioner

## 2022-08-23 VITALS — BP 124/86 | HR 84 | Temp 97.9°F | Ht 66.0 in | Wt 233.4 lb

## 2022-08-23 DIAGNOSIS — R0789 Other chest pain: Secondary | ICD-10-CM

## 2022-08-23 LAB — COMPREHENSIVE METABOLIC PANEL
ALT: 16 U/L (ref 0–35)
AST: 19 U/L (ref 0–37)
Albumin: 3.9 g/dL (ref 3.5–5.2)
Alkaline Phosphatase: 71 U/L (ref 39–117)
BUN: 12 mg/dL (ref 6–23)
CO2: 29 mEq/L (ref 19–32)
Calcium: 9.5 mg/dL (ref 8.4–10.5)
Chloride: 106 mEq/L (ref 96–112)
Creatinine, Ser: 0.79 mg/dL (ref 0.40–1.20)
GFR: 75.9 mL/min (ref >60.00)
Glucose, Bld: 95 mg/dL (ref 70–99)
Potassium: 4.6 mEq/L (ref 3.5–5.1)
Sodium: 142 mEq/L (ref 135–145)
Total Bilirubin: 1.2 mg/dL (ref 0.2–1.2)
Total Protein: 7.3 g/dL (ref 6.0–8.3)

## 2022-08-23 LAB — CBC WITH DIFFERENTIAL/PLATELET
Basophils Absolute: 0 10*3/uL (ref 0.0–0.1)
Basophils Relative: 0.5 % (ref 0.0–3.0)
Eosinophils Absolute: 0.1 10*3/uL (ref 0.0–0.7)
Eosinophils Relative: 1.3 % (ref 0.0–5.0)
HCT: 40.6 % (ref 36.0–46.0)
Hemoglobin: 13.5 g/dL (ref 12.0–15.0)
Lymphocytes Relative: 36.1 % (ref 12.0–46.0)
Lymphs Abs: 1.5 10*3/uL (ref 0.7–4.0)
MCHC: 33.3 g/dL (ref 30.0–36.0)
MCV: 90.9 fl (ref 78.0–100.0)
Monocytes Absolute: 0.2 10*3/uL (ref 0.1–1.0)
Monocytes Relative: 5.8 % (ref 3.0–12.0)
Neutro Abs: 2.3 10*3/uL (ref 1.4–7.7)
Neutrophils Relative %: 56.3 % (ref 43.0–77.0)
Platelets: 257 10*3/uL (ref 150.0–400.0)
RBC: 4.47 Mil/uL (ref 3.87–5.11)
RDW: 13.4 % (ref 11.5–15.5)
WBC: 4.2 10*3/uL (ref 4.0–10.5)

## 2022-08-23 LAB — TROPONIN I (HIGH SENSITIVITY): High Sens Troponin I: 3 ng/L (ref 2–17)

## 2022-08-23 LAB — TSH: TSH: 1.31 u[IU]/mL (ref 0.35–5.50)

## 2022-08-23 NOTE — Telephone Encounter (Signed)
Patient aware.

## 2022-08-23 NOTE — Progress Notes (Signed)
Established Patient Office Visit  Subjective:  [name], female    DOB: 06-22-1950  Age: 72 y.o. MRN: [id number]  CC:  Chief Complaint  Patient presents with   Gas    Pt feels like she has gas she is not sure if its in her chest states it feels like a bubble when she swallows    HPI  Nicole Hardy presents for gas and states had a funny feeling in her chest off and on from 1 week.  She states that the feeling is not increased by exertion and happens randomly. She states that she had constipation and is taking a laxative.  She had few episodes of diarrhea.  Patient requested to get the troponin level checked. She denies any chest pain, fever or abdominal pain at present.   HPI   Past Medical History:  Diagnosis Date   Complication of anesthesia    Degenerative disc disease, cervical    cervical   Hypercholesterolemia    Meralgia paraesthetica    Migraines    Nephrolithiasis    Neurodermatitis    Obesity    Osteopenia    PONV (postoperative nausea and vomiting)    Vitamin D deficiency     Past Surgical History:  Procedure Laterality Date   CHOLECYSTECTOMY N/A 04/24/2015   Procedure: LAPAROSCOPIC CHOLECYSTECTOMY WITH INTRAOPERATIVE CHOLANGIOGRAM;  Surgeon: Earline Mayotte, MD;  Location: [facility];  Service: General;  Laterality: N/A;   EXTRACORPOREAL SHOCK WAVE LITHOTRIPSY Right 11/17/2014   Procedure: EXTRACORPOREAL SHOCK WAVE LITHOTRIPSY (ESWL);  Surgeon: Orson Ape, MD;  Location: [facility];  Service: Urology;  Laterality: Right;   EXTRACORPOREAL SHOCK WAVE LITHOTRIPSY Left 12/15/2014   Procedure: EXTRACORPOREAL SHOCK WAVE LITHOTRIPSY (ESWL);  Surgeon: Orson Ape, MD;  Location: [facility];  Service: Urology;  Laterality: Left;   laparoscopic surgery  04/08/1988   nephrolithiasis (Dr Orson Slick)   LITHOTRIPSY  2010, 1980's   pessary  2003, 2016   TOTAL VAGINAL HYSTERECTOMY  2016    Family History  Problem Relation Age of Onset   Hypertension  Mother    Hypertension Father    Asthma Sister    Hypertension Sister    Diabetes Sister    Breast cancer Sister 82   Rectal cancer Sister     Social History   Socioeconomic History   Marital status: Widowed    Spouse name: Not on file   Number of children: 1   Years of education: Not on file   Highest education level: Not on file  Occupational History   Not on file  Tobacco Use   Smoking status: Never   Smokeless tobacco: Never  Vaping Use   Vaping Use: Never used  Substance and Sexual Activity   Alcohol use: No    Alcohol/week: 0.0 standard drinks of alcohol   Drug use: No   Sexual activity: Not Currently    Birth control/protection: Surgical  Other Topics Concern   Not on file  Social History Narrative   Not on file   Social Determinants of Health   Financial Resource Strain: Low Risk  (08/12/2022)   Overall Financial Resource Strain (CARDIA)    Difficulty of Paying Living Expenses: Not hard at all  Food Insecurity: No Food Insecurity (08/12/2022)   Hunger Vital Sign    Worried About Running Out of Food in the Last Year: Never true    Ran Out of Food in the Last Year: Never true  Transportation Needs: No Transportation Needs (  08/12/2022)   PRAPARE - Administrator, Civil Service (Medical): No    Lack of Transportation (Non-Medical): No  Physical Activity: Sufficiently Active (08/12/2022)   Exercise Vital Sign    Days of Exercise per Week: 7 days    Minutes of Exercise per Session: 30 min  Stress: No Stress Concern Present (08/12/2022)   [employer] of Occupational Health - Occupational Stress Questionnaire    Feeling of Stress : Not at all  Social Connections: Moderately Integrated (08/12/2022)   Social Connection and Isolation Panel [NHANES]    Frequency of Communication with Friends and Family: More than three times a week    Frequency of Social Gatherings with Friends and Family: More than three times a week    Attends Religious Services: More  than 4 times per year    Active Member of [employer] or Organizations: Yes    Attends Banker Meetings: More than 4 times per year    Marital Status: Widowed  Intimate Partner Violence: Not At Risk (08/12/2022)   Humiliation, Afraid, Rape, and Kick questionnaire    Fear of Current or Ex-Partner: No    Emotionally Abused: No    Physically Abused: No    Sexually Abused: No     Outpatient Medications Prior to Visit  Medication Sig Dispense Refill   Cholecalciferol (VITAMIN D3) 50 MCG (2000 UT) TABS Take by mouth.     rosuvastatin (CRESTOR) 5 MG tablet Take 1 tablet (5 mg total) by mouth daily. 90 tablet 1   No facility-administered medications prior to visit.    Allergies  Allergen Reactions   Metronidazole Other (See Comments)    Causes thrush in mouth    ROS Review of Systems  Constitutional: Negative.   HENT: Negative.    Respiratory: Negative.    Cardiovascular:  Negative for chest pain.       Funny feeling in her chest  Neurological: Negative.   Psychiatric/Behavioral: Negative.        Objective:    Physical Exam Constitutional:      Appearance: Normal appearance. She is obese.  HENT:     Head: Normocephalic.     Nose: Nose normal.     Mouth/Throat:     Mouth: Mucous membranes are moist.     Pharynx: Oropharynx is clear.  Eyes:     Extraocular Movements: Extraocular movements intact.     Conjunctiva/sclera: Conjunctivae normal.     Pupils: Pupils are equal, round, and reactive to light.  Cardiovascular:     Rate and Rhythm: Normal rate and regular rhythm.     Pulses: Normal pulses.     Heart sounds: Normal heart sounds.  Pulmonary:     Effort: Pulmonary effort is normal. No respiratory distress.     Breath sounds: Normal breath sounds. No rhonchi.  Abdominal:     General: Bowel sounds are normal.     Palpations: Abdomen is soft. There is no mass.     Tenderness: There is no abdominal tenderness.     Hernia: No hernia is present.   Musculoskeletal:        General: Normal range of motion.     Cervical back: Neck supple. No tenderness.  Skin:    General: Skin is warm.     Capillary Refill: Capillary refill takes less than 2 seconds.  Neurological:     General: No focal deficit present.     Mental Status: She is alert and oriented to person, place, and time.  Mental status is at baseline.  Psychiatric:        Mood and Affect: Mood normal.        Behavior: Behavior normal.        Thought Content: Thought content normal.        Judgment: Judgment normal.     BP 124/86   Pulse 84   Temp 97.9 F (36.6 C)   Ht 5\' 6"  (1.676 m)   Wt 233 lb 6.4 oz (105.9 kg)   LMP 09/10/2016 (Exact Date)   SpO2 98%   BMI 37.67 kg/m  Wt Readings from Last 3 Encounters:  08/23/22 233 lb 6.4 oz (105.9 kg)  08/12/22 237 lb (107.5 kg)  07/31/22 237 lb 3.2 oz (107.6 kg)     Health Maintenance  Topic Date Due   COVID-19 Vaccine (5 - 2023-24 season) 12/07/2021   Colonoscopy  07/24/2022   Zoster Vaccines- Shingrix (1 of 2) 10/30/2022 (Originally 06/09/2002)   Pneumonia Vaccine 34+ Years old (1 of 1 - PCV) 03/30/2023 (Originally 06/08/2017)   INFLUENZA VACCINE  11/07/2022   Medicare Annual Wellness (AWV)  08/12/2023   MAMMOGRAM  06/23/2024   DEXA SCAN  Completed   Hepatitis C Screening  Completed   HPV VACCINES  Aged Out   DTaP/Tdap/Td  Discontinued    There are no preventive care reminders to display for this patient.  Lab Results  Component Value Date   TSH 1.31 08/23/2022   Lab Results  Component Value Date   WBC 4.2 08/23/2022   HGB 13.5 08/23/2022   HCT 40.6 08/23/2022   MCV 90.9 08/23/2022   PLT 257.0 08/23/2022   Lab Results  Component Value Date   NA 142 08/23/2022   K 4.6 08/23/2022   CO2 29 08/23/2022   GLUCOSE 95 08/23/2022   BUN 12 08/23/2022   CREATININE 0.79 08/23/2022   BILITOT 1.2 08/23/2022   ALKPHOS 71 08/23/2022   AST 19 08/23/2022   ALT 16 08/23/2022   PROT 7.3 08/23/2022   ALBUMIN 3.9  08/23/2022   CALCIUM 9.5 08/23/2022   GFR 75.90 08/23/2022   Lab Results  Component Value Date   CHOL 137 07/26/2022   Lab Results  Component Value Date   HDL 41.10 07/26/2022   Lab Results  Component Value Date   LDLCALC 82 07/26/2022   Lab Results  Component Value Date   TRIG 69.0 07/26/2022   Lab Results  Component Value Date   CHOLHDL 3 07/26/2022   Lab Results  Component Value Date   HGBA1C 5.9 07/26/2022      Assessment & Plan:  Chest discomfort Assessment & Plan: EKG reassuring, vitals stable. Will check CMP, CBC and TSH Will check troponin level as requested by the patient. Referral sent to cardiologist for further evaluation.   Orders: -     EKG 12-Lead -     CBC with Differential/Platelet -     Comprehensive metabolic panel -     TSH -     Troponin I (High Sensitivity) -     Ambulatory referral to Cardiology    Follow-up: Return in about 2 weeks (around 09/06/2022).   Kara Dies, NP

## 2022-08-23 NOTE — Telephone Encounter (Signed)
Pt requesting cardiology referral to Dr Juliann Pares.

## 2022-08-23 NOTE — Progress Notes (Signed)
Please inform [name]:  All lab results are normal.

## 2022-08-23 NOTE — Telephone Encounter (Signed)
Referral sent to Dr. Juliann Pares.

## 2022-08-23 NOTE — Patient Instructions (Signed)
Labs ordered. Will send referral to cardiology

## 2022-08-23 NOTE — Telephone Encounter (Signed)
Patient wanted Dr Lorin Picket to know the name of the cardiologist, Dr Annamary Carolin to be referred to.

## 2022-09-02 DIAGNOSIS — R0789 Other chest pain: Secondary | ICD-10-CM | POA: Insufficient documentation

## 2022-09-02 NOTE — Assessment & Plan Note (Signed)
EKG reassuring, vitals stable. Will check CMP, CBC and TSH Will check troponin level as requested by the patient. Referral sent to cardiologist for further evaluation.

## 2022-09-05 NOTE — Progress Notes (Signed)
Addendum Fall screening was completed on 08/12/22 .( Added 09/05/22)

## 2022-09-06 ENCOUNTER — Ambulatory Visit (INDEPENDENT_AMBULATORY_CARE_PROVIDER_SITE_OTHER): Payer: Medicare HMO | Admitting: Nurse Practitioner

## 2022-09-06 ENCOUNTER — Encounter: Payer: Self-pay | Admitting: Nurse Practitioner

## 2022-09-06 VITALS — BP 130/80 | HR 60 | Temp 97.8°F | Ht 66.0 in | Wt 239.6 lb

## 2022-09-06 DIAGNOSIS — R0789 Other chest pain: Secondary | ICD-10-CM | POA: Diagnosis not present

## 2022-09-06 NOTE — Progress Notes (Signed)
Established Patient Office Visit  Subjective:  [name], female    DOB: [birth year]  Age: 73 y.o. MRN: [id number]  CC:  Chief Complaint  Patient presents with   Medical Management of Chronic Issues    2 week follow up Pt states she has been feeling good since last OV    HPI  Nicole Hardy presents for follow up on chest discomfort. She states she is doing. She did not experience any funny feeling or bubbles since are last visit.  The referral was placed for cardiology during the last visit.    HPI   Past Medical History:  Diagnosis Date   Complication of anesthesia    Degenerative disc disease, cervical    cervical   Hypercholesterolemia    Meralgia paraesthetica    Migraines    Nephrolithiasis    Neurodermatitis    Obesity    Osteopenia    PONV (postoperative nausea and vomiting)    Vitamin D deficiency     Past Surgical History:  Procedure Laterality Date   CHOLECYSTECTOMY N/A 04/24/2015   Procedure: LAPAROSCOPIC CHOLECYSTECTOMY WITH INTRAOPERATIVE CHOLANGIOGRAM;  Surgeon: Earline Mayotte, MD;  Location: [facility];  Service: General;  Laterality: N/A;   EXTRACORPOREAL SHOCK WAVE LITHOTRIPSY Right 11/17/2014   Procedure: EXTRACORPOREAL SHOCK WAVE LITHOTRIPSY (ESWL);  Surgeon: Orson Ape, MD;  Location: [facility];  Service: Urology;  Laterality: Right;   EXTRACORPOREAL SHOCK WAVE LITHOTRIPSY Left 12/15/2014   Procedure: EXTRACORPOREAL SHOCK WAVE LITHOTRIPSY (ESWL);  Surgeon: Orson Ape, MD;  Location: [facility];  Service: Urology;  Laterality: Left;   laparoscopic surgery  04/08/1988   nephrolithiasis (Dr Orson Slick)   LITHOTRIPSY  2010, 1980's   pessary  2003, 2016   TOTAL VAGINAL HYSTERECTOMY  2016    Family History  Problem Relation Age of Onset   Hypertension Mother    Hypertension Father    Asthma Sister    Hypertension Sister    Diabetes Sister    Breast cancer Sister 27   Rectal cancer Sister     Social History   Socioeconomic  History   Marital status: Widowed    Spouse name: Not on file   Number of children: 1   Years of education: Not on file   Highest education level: Not on file  Occupational History   Not on file  Tobacco Use   Smoking status: Never   Smokeless tobacco: Never  Vaping Use   Vaping Use: Never used  Substance and Sexual Activity   Alcohol use: No    Alcohol/week: 0.0 standard drinks of alcohol   Drug use: No   Sexual activity: Not Currently    Birth control/protection: Surgical  Other Topics Concern   Not on file  Social History Narrative   Not on file   Social Determinants of Health   Financial Resource Strain: Low Risk  (08/12/2022)   Overall Financial Resource Strain (CARDIA)    Difficulty of Paying Living Expenses: Not hard at all  Food Insecurity: No Food Insecurity (08/12/2022)   Hunger Vital Sign    Worried About Running Out of Food in the Last Year: Never true    Ran Out of Food in the Last Year: Never true  Transportation Needs: No Transportation Needs (08/12/2022)   PRAPARE - Administrator, Civil Service (Medical): No    Lack of Transportation (Non-Medical): No  Physical Activity: Sufficiently Active (08/12/2022)   Exercise Vital Sign    Days of Exercise  per Week: 7 days    Minutes of Exercise per Session: 30 min  Stress: No Stress Concern Present (08/12/2022)   [employer] of Occupational Health - Occupational Stress Questionnaire    Feeling of Stress : Not at all  Social Connections: Moderately Integrated (08/12/2022)   Social Connection and Isolation Panel [NHANES]    Frequency of Communication with Friends and Family: More than three times a week    Frequency of Social Gatherings with Friends and Family: More than three times a week    Attends Religious Services: More than 4 times per year    Active Member of [employer] or Organizations: Yes    Attends Banker Meetings: More than 4 times per year    Marital Status: Widowed  Intimate Partner  Violence: Not At Risk (08/12/2022)   Humiliation, Afraid, Rape, and Kick questionnaire    Fear of Current or Ex-Partner: No    Emotionally Abused: No    Physically Abused: No    Sexually Abused: No     Outpatient Medications Prior to Visit  Medication Sig Dispense Refill   Cholecalciferol (VITAMIN D3) 50 MCG (2000 UT) TABS Take by mouth.     rosuvastatin (CRESTOR) 5 MG tablet Take 1 tablet (5 mg total) by mouth daily. 90 tablet 1   No facility-administered medications prior to visit.    Allergies  Allergen Reactions   Metronidazole Other (See Comments)    Causes thrush in mouth    ROS Review of Systems  Constitutional: Negative.   Respiratory:  Negative for cough and shortness of breath.   Cardiovascular: Negative.   Gastrointestinal: Negative.   Neurological: Negative.   Hematological: Negative.   Psychiatric/Behavioral: Negative.        Objective:    Physical Exam Constitutional:      Appearance: Normal appearance. She is normal weight.  HENT:     Head: Normocephalic.  Cardiovascular:     Rate and Rhythm: Normal rate and regular rhythm.     Pulses: Normal pulses.     Heart sounds: Normal heart sounds. No murmur heard. Pulmonary:     Effort: Pulmonary effort is normal.     Breath sounds: Normal breath sounds.  Musculoskeletal:     Cervical back: Normal range of motion.  Skin:    General: Skin is warm.     Coloration: Skin is not pale.     Findings: No bruising.  Neurological:     General: No focal deficit present.     Mental Status: She is alert and oriented to person, place, and time. Mental status is at baseline.  Psychiatric:        Mood and Affect: Mood normal.        Behavior: Behavior normal.        Thought Content: Thought content normal.        Judgment: Judgment normal.     BP 130/80   Pulse 60   Temp 97.8 F (36.6 C)   Ht 5\' 6"  (1.676 m)   Wt 239 lb 9.6 oz (108.7 kg)   LMP 09/10/2016 (Exact Date)   SpO2 96%   BMI 38.67 kg/m  Wt  Readings from Last 3 Encounters:  09/06/22 239 lb 9.6 oz (108.7 kg)  08/23/22 233 lb 6.4 oz (105.9 kg)  08/12/22 237 lb (107.5 kg)     Health Maintenance  Topic Date Due   COVID-19 Vaccine (5 - 2023-24 season) 12/07/2021   Colonoscopy  07/24/2022   Zoster Vaccines- Shingrix (  1 of 2) 10/30/2022 (Originally 06/09/2002)   Pneumonia Vaccine 63+ Years old (1 of 1 - PCV) 03/30/2023 (Originally 06/08/2017)   INFLUENZA VACCINE  11/07/2022   Medicare Annual Wellness (AWV)  08/12/2023   MAMMOGRAM  06/23/2024   DEXA SCAN  Completed   Hepatitis C Screening  Completed   HPV VACCINES  Aged Out   DTaP/Tdap/Td  Discontinued    There are no preventive care reminders to display for this patient.  Lab Results  Component Value Date   TSH 1.31 08/23/2022   Lab Results  Component Value Date   WBC 4.2 08/23/2022   HGB 13.5 08/23/2022   HCT 40.6 08/23/2022   MCV 90.9 08/23/2022   PLT 257.0 08/23/2022   Lab Results  Component Value Date   NA 142 08/23/2022   K 4.6 08/23/2022   CO2 29 08/23/2022   GLUCOSE 95 08/23/2022   BUN 12 08/23/2022   CREATININE 0.79 08/23/2022   BILITOT 1.2 08/23/2022   ALKPHOS 71 08/23/2022   AST 19 08/23/2022   ALT 16 08/23/2022   PROT 7.3 08/23/2022   ALBUMIN 3.9 08/23/2022   CALCIUM 9.5 08/23/2022   GFR 75.90 08/23/2022   Lab Results  Component Value Date   CHOL 137 07/26/2022   Lab Results  Component Value Date   HDL 41.10 07/26/2022   Lab Results  Component Value Date   LDLCALC 82 07/26/2022   Lab Results  Component Value Date   TRIG 69.0 07/26/2022   Lab Results  Component Value Date   CHOLHDL 3 07/26/2022   Lab Results  Component Value Date   HGBA1C 5.9 07/26/2022      Assessment & Plan:  Chest discomfort Assessment & Plan: Resolved at present. Advised to follow with cardiology.      Follow-up: Return if symptoms worsen or fail to improve.   Kara Dies, NP

## 2022-09-06 NOTE — Patient Instructions (Signed)
Symptoms are resolved at present.  Follow up with cardiology.

## 2022-09-06 NOTE — Assessment & Plan Note (Addendum)
Resolved at present. Advised to follow with cardiology.

## 2022-12-02 ENCOUNTER — Other Ambulatory Visit: Payer: Medicare HMO

## 2022-12-03 ENCOUNTER — Other Ambulatory Visit (INDEPENDENT_AMBULATORY_CARE_PROVIDER_SITE_OTHER): Payer: Medicare HMO

## 2022-12-03 ENCOUNTER — Telehealth: Payer: Self-pay | Admitting: Internal Medicine

## 2022-12-03 DIAGNOSIS — E7800 Pure hypercholesterolemia, unspecified: Secondary | ICD-10-CM

## 2022-12-03 LAB — HEPATIC FUNCTION PANEL
ALT: 16 U/L (ref 0–35)
AST: 16 U/L (ref 0–37)
Albumin: 3.9 g/dL (ref 3.5–5.2)
Alkaline Phosphatase: 70 U/L (ref 39–117)
Bilirubin, Direct: 0.2 mg/dL (ref 0.0–0.3)
Total Bilirubin: 0.7 mg/dL (ref 0.2–1.2)
Total Protein: 7.1 g/dL (ref 6.0–8.3)

## 2022-12-03 LAB — BASIC METABOLIC PANEL
BUN: 15 mg/dL (ref 6–23)
CO2: 30 mEq/L (ref 19–32)
Calcium: 9.4 mg/dL (ref 8.4–10.5)
Chloride: 106 mEq/L (ref 96–112)
Creatinine, Ser: 0.79 mg/dL (ref 0.40–1.20)
GFR: 75.75 mL/min (ref >60.00)
Glucose, Bld: 109 mg/dL — ABNORMAL HIGH (ref 70–99)
Potassium: 4.5 mEq/L (ref 3.5–5.1)
Sodium: 143 mEq/L (ref 135–145)

## 2022-12-03 LAB — LIPID PANEL
Cholesterol: 142 mg/dL (ref 0–200)
HDL: 43.9 mg/dL (ref >39.00)
LDL Cholesterol: 83 mg/dL (ref 0–99)
NonHDL: 97.62
Total CHOL/HDL Ratio: 3
Triglycerides: 73 mg/dL (ref 0.0–149.0)
VLDL: 14.6 mg/dL (ref 0.0–40.0)

## 2022-12-03 NOTE — Telephone Encounter (Signed)
Added to med list

## 2022-12-03 NOTE — Telephone Encounter (Signed)
Pt came into the office letting us know that she is taking Qunal ultra coq10 and would like for it to be added to her medication list

## 2022-12-04 ENCOUNTER — Ambulatory Visit: Payer: Medicare HMO | Admitting: Internal Medicine

## 2022-12-10 ENCOUNTER — Ambulatory Visit (INDEPENDENT_AMBULATORY_CARE_PROVIDER_SITE_OTHER): Payer: Medicare HMO | Admitting: Internal Medicine

## 2022-12-10 ENCOUNTER — Encounter: Payer: Self-pay | Admitting: Internal Medicine

## 2022-12-10 VITALS — BP 114/72 | HR 73 | Temp 97.6°F | Ht 66.0 in | Wt 235.6 lb

## 2022-12-10 DIAGNOSIS — R0689 Other abnormalities of breathing: Secondary | ICD-10-CM | POA: Diagnosis not present

## 2022-12-10 DIAGNOSIS — E7800 Pure hypercholesterolemia, unspecified: Secondary | ICD-10-CM

## 2022-12-10 DIAGNOSIS — Z1211 Encounter for screening for malignant neoplasm of colon: Secondary | ICD-10-CM

## 2022-12-10 DIAGNOSIS — I700 Atherosclerosis of aorta: Secondary | ICD-10-CM | POA: Diagnosis not present

## 2022-12-10 DIAGNOSIS — F439 Reaction to severe stress, unspecified: Secondary | ICD-10-CM

## 2022-12-10 DIAGNOSIS — R739 Hyperglycemia, unspecified: Secondary | ICD-10-CM

## 2022-12-10 NOTE — Progress Notes (Signed)
Subjective:    [name], female    DOB: [birth year], 72 y.o.   MRN: [id number]  Patient here for  Chief Complaint  Patient presents with   Medical Management of Chronic Issues    HPI Here to follow up regarding hypercholesterolemia, stress and hyperglycemia. Evaluated for chest pain 08/2022.  Saw Dr Juliann Pares 11/07/22 - recommended echo. ECHO 12/05/22 - normal LF function, normal right ventricular systolic function, mild TR and PR with EF 55%. Reports she is doing relatively well. No chest pain reported.  Her daughter recorded her sleeping.  Some loud breathing when she sleeps. Discussed possible sleep apnea.  Some fatigue.  She is exercising.  No abdominal pain.  Handling stress.    Past Medical History:  Diagnosis Date   Complication of anesthesia    Degenerative disc disease, cervical    cervical   Hypercholesterolemia    Meralgia paraesthetica    Migraines    Nephrolithiasis    Neurodermatitis    Obesity    Osteopenia    PONV (postoperative nausea and vomiting)    Vitamin D deficiency    Past Surgical History:  Procedure Laterality Date   CHOLECYSTECTOMY N/A 04/24/2015   Procedure: LAPAROSCOPIC CHOLECYSTECTOMY WITH INTRAOPERATIVE CHOLANGIOGRAM;  Surgeon: Earline Mayotte, MD;  Location: [facility];  Service: General;  Laterality: N/A;   EXTRACORPOREAL SHOCK WAVE LITHOTRIPSY Right 11/17/2014   Procedure: EXTRACORPOREAL SHOCK WAVE LITHOTRIPSY (ESWL);  Surgeon: Orson Ape, MD;  Location: [facility] OR[facility];  Service: Urology;  Laterality: Right;   EXTRACORPOREAL SHOCK WAVE LITHOTRIPSY Left 12/15/2014   Procedure: EXTRACORPOREAL SHOCK WAVE LITHOTRIPSY (ESWL);  Surgeon: Orson Ape, MD;  Location: [facility];  Service: Urology;  Laterality: Left;   laparoscopic surgery  04/08/1988   nephrolithiasis (Dr Orson Slick)   LITHOTRIPSY  2010, 1980's   pessary  2003, 2016   TOTAL VAGINAL HYSTERECTOMY  2016   Family History  Problem Relation Age of Onset   Hypertension Mother     Hypertension Father    Asthma Sister    Hypertension Sister    Diabetes Sister    Breast cancer Sister 13   Rectal cancer Sister    Social History   Socioeconomic History   Marital status: Widowed    Spouse name: Not on file   Number of children: 1   Years of education: Not on file   Highest education level: Not on file  Occupational History   Not on file  Tobacco Use   Smoking status: Never   Smokeless tobacco: Never  Vaping Use   Vaping status: Never Used  Substance and Sexual Activity   Alcohol use: No    Alcohol/week: 0.0 standard drinks of alcohol   Drug use: No   Sexual activity: Not Currently    Birth control/protection: Surgical  Other Topics Concern   Not on file  Social History Narrative   Not on file   Social Determinants of Health   Financial Resource Strain: Low Risk  (08/12/2022)   Overall Financial Resource Strain (CARDIA)    Difficulty of Paying Living Expenses: Not hard at all  Food Insecurity: No Food Insecurity (08/12/2022)   Hunger Vital Sign    Worried About Running Out of Food in the Last Year: Never true    Ran Out of Food in the Last Year: Never true  Transportation Needs: No Transportation Needs (08/12/2022)   PRAPARE - Administrator, Civil Service (Medical): No    Lack of Transportation (Non-Medical):  No  Physical Activity: Sufficiently Active (08/12/2022)   Exercise Vital Sign    Days of Exercise per Week: 7 days    Minutes of Exercise per Session: 30 min  Stress: No Stress Concern Present (08/12/2022)   [employer] of Occupational Health - Occupational Stress Questionnaire    Feeling of Stress : Not at all  Social Connections: Moderately Integrated (08/12/2022)   Social Connection and Isolation Panel [NHANES]    Frequency of Communication with Friends and Family: More than three times a week    Frequency of Social Gatherings with Friends and Family: More than three times a week    Attends Religious Services: More than 4 times  per year    Active Member of [employer] or Organizations: Yes    Attends Banker Meetings: More than 4 times per year    Marital Status: Widowed     Review of Systems  Constitutional:  Negative for appetite change and unexpected weight change.  HENT:  Negative for congestion and sinus pressure.   Respiratory:  Negative for cough, chest tightness and shortness of breath.   Cardiovascular:  Negative for chest pain, palpitations and leg swelling.  Gastrointestinal:  Negative for abdominal pain, diarrhea, nausea and vomiting.  Genitourinary:  Negative for difficulty urinating and dysuria.  Musculoskeletal:  Negative for joint swelling and myalgias.  Skin:  Negative for color change and rash.  Neurological:  Negative for dizziness and headaches.  Psychiatric/Behavioral:  Negative for agitation and dysphoric mood.        Objective:     BP 114/72   Pulse 73   Temp 97.6 F (36.4 C) (Oral)   Ht 5\' 6"  (1.676 m)   Wt 235 lb 9.6 oz (106.9 kg)   LMP 09/10/2016 (Exact Date)   SpO2 97%   BMI 38.03 kg/m  Wt Readings from Last 3 Encounters:  12/10/22 235 lb 9.6 oz (106.9 kg)  09/06/22 239 lb 9.6 oz (108.7 kg)  08/23/22 233 lb 6.4 oz (105.9 kg)    Physical Exam Vitals reviewed.  Constitutional:      General: She is not in acute distress.    Appearance: Normal appearance.  HENT:     Head: Normocephalic and atraumatic.     Right Ear: External ear normal.     Left Ear: External ear normal.  Eyes:     General: No scleral icterus.       Right eye: No discharge.        Left eye: No discharge.     Conjunctiva/sclera: Conjunctivae normal.  Neck:     Thyroid: No thyromegaly.  Cardiovascular:     Rate and Rhythm: Normal rate and regular rhythm.  Pulmonary:     Effort: No respiratory distress.     Breath sounds: Normal breath sounds. No wheezing.  Abdominal:     General: Bowel sounds are normal.     Palpations: Abdomen is soft.     Tenderness: There is no abdominal  tenderness.  Musculoskeletal:        General: No swelling or tenderness.     Cervical back: Neck supple. No tenderness.  Lymphadenopathy:     Cervical: No cervical adenopathy.  Skin:    Findings: No erythema or rash.  Neurological:     Mental Status: She is alert.  Psychiatric:        Mood and Affect: Mood normal.        Behavior: Behavior normal.      Outpatient Encounter Medications as  of 12/10/2022  Medication Sig   Cholecalciferol (VITAMIN D3) 50 MCG (2000 UT) TABS Take by mouth.   rosuvastatin (CRESTOR) 5 MG tablet Take 1 tablet (5 mg total) by mouth daily.   [DISCONTINUED] Coenzyme Q10 (COQ10 PO) Take by mouth. (Patient not taking: Reported on 12/10/2022)   No facility-administered encounter medications on file as of 12/10/2022.     Lab Results  Component Value Date   WBC 4.2 08/23/2022   HGB 13.5 08/23/2022   HCT 40.6 08/23/2022   PLT 257.0 08/23/2022   GLUCOSE 109 (H) 12/03/2022   CHOL 142 12/03/2022   TRIG 73.0 12/03/2022   HDL 43.90 12/03/2022   LDLCALC 83 12/03/2022   ALT 16 12/03/2022   AST 16 12/03/2022   NA 143 12/03/2022   K 4.5 12/03/2022   CL 106 12/03/2022   CREATININE 0.79 12/03/2022   BUN 15 12/03/2022   CO2 30 12/03/2022   TSH 1.31 08/23/2022   HGBA1C 5.9 07/26/2022    MM 3D SCREEN BREAST BILATERAL  Result Date: 06/25/2022 CLINICAL DATA:  Screening. EXAM: DIGITAL SCREENING BILATERAL MAMMOGRAM WITH TOMOSYNTHESIS AND CAD TECHNIQUE: Bilateral screening digital craniocaudal and mediolateral oblique mammograms were obtained. Bilateral screening digital breast tomosynthesis was performed. The images were evaluated with computer-aided detection. COMPARISON:  Previous exam(s). ACR Breast Density Category a: The breasts are almost entirely fatty. FINDINGS: There are no findings suspicious for malignancy. IMPRESSION: No mammographic evidence of malignancy. A result letter of this screening mammogram will be mailed directly to the patient. RECOMMENDATION:  Screening mammogram in one year. (Code:SM-B-01Y) BI-RADS CATEGORY  1: Negative. Electronically Signed   By: Sande Brothers M.D.   On: 06/25/2022 14:04       Assessment & Plan:  Hyperglycemia Assessment & Plan: Low carb diet and exercise.  Follow met b and a1c.   Lab Results  Component Value Date   HGBA1C 5.9 07/26/2022    Orders: -     Hemoglobin A1c; Future -     Glucose, fasting; Future  Loud breathing during sleep Assessment & Plan: Listened to a recording.  Discussed possible sleep apnea.  Some fatigue.  Will refer to pulmonary for further evaluation.   Orders: -     Ambulatory referral to Pulmonology  Aortic atherosclerosis [facility]) Assessment & Plan: Continue crestor.    Hypercholesterolemia Assessment & Plan: On crestor.  Tolerating.  Cholesterol improved.  Low cholesterol diet and exercise.  Follow lipid panel and liver function tests.   Lab Results  Component Value Date   CHOL 142 12/03/2022   HDL 43.90 12/03/2022   LDLCALC 83 12/03/2022   TRIG 73.0 12/03/2022   CHOLHDL 3 12/03/2022     Stress Assessment & Plan: Overall appears to be handling stress well.  Discussed.  Follow.     Colon cancer screening Assessment & Plan: Last colonoscopy 07/2012 - diverticulosis in the sigmoid colon and internal hemorrhoids.  Discussed f/u with GI for f/u colonoscopy.  Agreeable for referral.   Orders: -     Ambulatory referral to Gastroenterology     Dale Sanger, MD

## 2022-12-15 ENCOUNTER — Encounter: Payer: Self-pay | Admitting: Internal Medicine

## 2022-12-15 DIAGNOSIS — Z1211 Encounter for screening for malignant neoplasm of colon: Secondary | ICD-10-CM | POA: Insufficient documentation

## 2022-12-15 NOTE — Assessment & Plan Note (Signed)
Continue crestor 

## 2022-12-15 NOTE — Assessment & Plan Note (Signed)
Listened to a recording.  Discussed possible sleep apnea.  Some fatigue.  Will refer to pulmonary for further evaluation.

## 2022-12-15 NOTE — Assessment & Plan Note (Signed)
Low carb diet and exercise.  Follow met b and a1c.   Lab Results  Component Value Date   HGBA1C 5.9 07/26/2022

## 2022-12-15 NOTE — Assessment & Plan Note (Signed)
Last colonoscopy 07/2012 - diverticulosis in the sigmoid colon and internal hemorrhoids.  Discussed f/u with GI for f/u colonoscopy.  Agreeable for referral.

## 2022-12-15 NOTE — Assessment & Plan Note (Signed)
On crestor.  Tolerating.  Cholesterol improved.  Low cholesterol diet and exercise.  Follow lipid panel and liver function tests.   Lab Results  Component Value Date   CHOL 142 12/03/2022   HDL 43.90 12/03/2022   LDLCALC 83 12/03/2022   TRIG 73.0 12/03/2022   CHOLHDL 3 12/03/2022

## 2022-12-15 NOTE — Assessment & Plan Note (Signed)
Overall appears to be handling stress well.  Discussed.  Follow.   

## 2022-12-26 ENCOUNTER — Encounter: Payer: Self-pay | Admitting: *Deleted

## 2023-01-07 ENCOUNTER — Other Ambulatory Visit (INDEPENDENT_AMBULATORY_CARE_PROVIDER_SITE_OTHER): Payer: Medicare HMO

## 2023-01-07 ENCOUNTER — Other Ambulatory Visit: Payer: Medicare HMO

## 2023-01-07 DIAGNOSIS — R739 Hyperglycemia, unspecified: Secondary | ICD-10-CM

## 2023-01-07 LAB — HEMOGLOBIN A1C: Hgb A1c MFr Bld: 5.9 % (ref 4.6–6.5)

## 2023-01-08 LAB — GLUCOSE, FASTING: Glucose, Bld: 97 mg/dL (ref 65–99)

## 2023-02-19 ENCOUNTER — Institutional Professional Consult (permissible substitution): Payer: Medicare HMO | Admitting: Primary Care

## 2023-02-23 ENCOUNTER — Telehealth: Payer: Self-pay | Admitting: Internal Medicine

## 2023-02-23 DIAGNOSIS — E7800 Pure hypercholesterolemia, unspecified: Secondary | ICD-10-CM

## 2023-02-24 NOTE — Telephone Encounter (Signed)
Medication has been filled, pt notified

## 2023-02-24 NOTE — Telephone Encounter (Signed)
Prescription Request  02/24/2023  LOV: 12/10/2022  What is the name of the medication or equipment? rosuvastatin   Have you contacted your pharmacy to request a refill? Yes   Which pharmacy would you like this sent to?  [facility] [address] ([address]), [place] - [address] [place]) [place] [address] Phone: [phone] Fax: [phone]    Patient notified that their request is being sent to the clinical staff for review and that they should receive a response within 2 business days.   Please advise at Mobile [phone] (mobile)

## 2023-04-03 ENCOUNTER — Telehealth: Payer: Self-pay | Admitting: Internal Medicine

## 2023-04-03 DIAGNOSIS — E7800 Pure hypercholesterolemia, unspecified: Secondary | ICD-10-CM

## 2023-04-03 DIAGNOSIS — R739 Hyperglycemia, unspecified: Secondary | ICD-10-CM

## 2023-04-03 DIAGNOSIS — E559 Vitamin D deficiency, unspecified: Secondary | ICD-10-CM

## 2023-04-03 NOTE — Telephone Encounter (Signed)
Patient need lab orders.

## 2023-04-04 NOTE — Telephone Encounter (Signed)
Orders placed for f/u labs.  

## 2023-04-10 ENCOUNTER — Other Ambulatory Visit: Payer: Medicare HMO

## 2023-04-15 ENCOUNTER — Ambulatory Visit: Payer: Medicare HMO | Admitting: Internal Medicine

## 2023-04-15 ENCOUNTER — Other Ambulatory Visit: Payer: Self-pay | Admitting: Internal Medicine

## 2023-04-15 DIAGNOSIS — Z1231 Encounter for screening mammogram for malignant neoplasm of breast: Secondary | ICD-10-CM

## 2023-05-19 ENCOUNTER — Other Ambulatory Visit: Payer: Self-pay | Admitting: Internal Medicine

## 2023-05-19 DIAGNOSIS — E7800 Pure hypercholesterolemia, unspecified: Secondary | ICD-10-CM

## 2023-06-23 ENCOUNTER — Other Ambulatory Visit (INDEPENDENT_AMBULATORY_CARE_PROVIDER_SITE_OTHER): Payer: Medicare HMO

## 2023-06-23 DIAGNOSIS — E7800 Pure hypercholesterolemia, unspecified: Secondary | ICD-10-CM | POA: Diagnosis not present

## 2023-06-23 DIAGNOSIS — E559 Vitamin D deficiency, unspecified: Secondary | ICD-10-CM

## 2023-06-23 DIAGNOSIS — R739 Hyperglycemia, unspecified: Secondary | ICD-10-CM | POA: Diagnosis not present

## 2023-06-23 LAB — HEPATIC FUNCTION PANEL
ALT: 16 U/L (ref 0–35)
AST: 19 U/L (ref 0–37)
Albumin: 4 g/dL (ref 3.5–5.2)
Alkaline Phosphatase: 68 U/L (ref 39–117)
Bilirubin, Direct: 0.2 mg/dL (ref 0.0–0.3)
Total Bilirubin: 1.2 mg/dL (ref 0.2–1.2)
Total Protein: 7.4 g/dL (ref 6.0–8.3)

## 2023-06-23 LAB — BASIC METABOLIC PANEL
BUN: 10 mg/dL (ref 6–23)
CO2: 29 meq/L (ref 19–32)
Calcium: 9.4 mg/dL (ref 8.4–10.5)
Chloride: 105 meq/L (ref 96–112)
Creatinine, Ser: 0.66 mg/dL (ref 0.40–1.20)
GFR: 88.49 mL/min (ref >60.00)
Glucose, Bld: 88 mg/dL (ref 70–99)
Potassium: 4 meq/L (ref 3.5–5.1)
Sodium: 141 meq/L (ref 135–145)

## 2023-06-23 LAB — LIPID PANEL
Cholesterol: 144 mg/dL (ref 0–200)
HDL: 48.7 mg/dL (ref >39.00)
LDL Cholesterol: 83 mg/dL (ref 0–99)
NonHDL: 95.07
Total CHOL/HDL Ratio: 3
Triglycerides: 59 mg/dL (ref 0.0–149.0)
VLDL: 11.8 mg/dL (ref 0.0–40.0)

## 2023-06-23 LAB — VITAMIN D 25 HYDROXY (VIT D DEFICIENCY, FRACTURES): VITD: 27.28 ng/mL — ABNORMAL LOW (ref 30.00–100.00)

## 2023-06-23 LAB — HEMOGLOBIN A1C: Hgb A1c MFr Bld: 5.9 % (ref 4.6–6.5)

## 2023-06-26 ENCOUNTER — Ambulatory Visit: Payer: Medicare HMO | Admitting: Internal Medicine

## 2023-06-26 ENCOUNTER — Encounter: Payer: Self-pay | Admitting: Internal Medicine

## 2023-06-26 ENCOUNTER — Ambulatory Visit
Admission: RE | Admit: 2023-06-26 | Discharge: 2023-06-26 | Disposition: A | Discharge status: Home / Self Care | Payer: Medicare HMO | Source: Ambulatory Visit | Attending: Internal Medicine | Admitting: Internal Medicine

## 2023-06-26 VITALS — BP 126/68 | HR 80 | Temp 98.3°F | Resp 16 | Ht 64.0 in | Wt 222.8 lb

## 2023-06-26 DIAGNOSIS — I700 Atherosclerosis of aorta: Secondary | ICD-10-CM | POA: Diagnosis not present

## 2023-06-26 DIAGNOSIS — R0789 Other chest pain: Secondary | ICD-10-CM | POA: Diagnosis not present

## 2023-06-26 DIAGNOSIS — Z1231 Encounter for screening mammogram for malignant neoplasm of breast: Secondary | ICD-10-CM | POA: Insufficient documentation

## 2023-06-26 DIAGNOSIS — R739 Hyperglycemia, unspecified: Secondary | ICD-10-CM

## 2023-06-26 DIAGNOSIS — E7800 Pure hypercholesterolemia, unspecified: Secondary | ICD-10-CM | POA: Diagnosis not present

## 2023-06-26 DIAGNOSIS — Z1211 Encounter for screening for malignant neoplasm of colon: Secondary | ICD-10-CM

## 2023-06-26 DIAGNOSIS — E559 Vitamin D deficiency, unspecified: Secondary | ICD-10-CM

## 2023-06-26 MED ORDER — ROSUVASTATIN CALCIUM 5 MG PO TABS: 5.0000 mg | ORAL_TABLET | Freq: Every day | ORAL | 3 refills | Status: AC

## 2023-06-26 NOTE — Assessment & Plan Note (Signed)
 Continue crestor

## 2023-06-26 NOTE — Assessment & Plan Note (Signed)
 On crestor.  Tolerating.  Cholesterol improved.  Low cholesterol diet and exercise.  Follow lipid panel and liver function tests.   Lab Results  Component Value Date   CHOL 144 06/23/2023   HDL 48.70 06/23/2023   LDLCALC 83 06/23/2023   TRIG 59.0 06/23/2023   CHOLHDL 3 06/23/2023

## 2023-06-26 NOTE — Assessment & Plan Note (Signed)
 Continue vitamin D supplements. Increase to 2000 units vitamin D3 q day.

## 2023-06-26 NOTE — Progress Notes (Signed)
 Subjective:    [name], female    DOB: 1950/04/24, 73 y.o.   MRN: [id number]  Patient here for  Chief Complaint  Patient presents with   Medical Management of Chronic Issues    HPI Here to follow up regarding hypercholesterolemia, stress and hyperglycemia. Evaluated for chest pain 08/2022. Saw Dr Juliann Pares 11/07/22 - recommended echo. ECHO 12/05/22 - normal LF function, normal right ventricular systolic function, mild TR and PR with EF 55%. Had f/u with cardiology 12/2023 - stable. Saw pulmonary 01/01/24 - evaluation for sleep apnea. Evaluated by podiatry - foot sprain - injury 04/19/23. Recommended heat, ice and tylenol. Foot is doing well. No pain. Reports no residual problems. She is going to water aerobics and to the senior center. Exercising. No chest pain or sob.    Past Medical History:  Diagnosis Date   Complication of anesthesia    Degenerative disc disease, cervical    cervical   Hypercholesterolemia    Meralgia paraesthetica    Migraines    Nephrolithiasis    Neurodermatitis    Obesity    Osteopenia    PONV (postoperative nausea and vomiting)    Vitamin D deficiency    Past Surgical History:  Procedure Laterality Date   CHOLECYSTECTOMY N/A 04/24/2015   Procedure: LAPAROSCOPIC CHOLECYSTECTOMY WITH INTRAOPERATIVE CHOLANGIOGRAM;  Surgeon: Earline Mayotte, MD;  Location: [facility];  Service: General;  Laterality: N/A;   EXTRACORPOREAL SHOCK WAVE LITHOTRIPSY Right 11/17/2014   Procedure: EXTRACORPOREAL SHOCK WAVE LITHOTRIPSY (ESWL);  Surgeon: Orson Ape, MD;  Location: [facility] OR[facility];  Service: Urology;  Laterality: Right;   EXTRACORPOREAL SHOCK WAVE LITHOTRIPSY Left 12/15/2014   Procedure: EXTRACORPOREAL SHOCK WAVE LITHOTRIPSY (ESWL);  Surgeon: Orson Ape, MD;  Location: [facility];  Service: Urology;  Laterality: Left;   laparoscopic surgery  04/08/1988   nephrolithiasis (Dr Orson Slick)   LITHOTRIPSY  2010, 1980's   pessary  2003, 2016   TOTAL VAGINAL  HYSTERECTOMY  2016   Family History  Problem Relation Age of Onset   Hypertension Mother    Hypertension Father    Asthma Sister    Hypertension Sister    Diabetes Sister    Breast cancer Sister 108   Rectal cancer Sister    Social History   Socioeconomic History   Marital status: Widowed    Spouse name: Not on file   Number of children: 1   Years of education: Not on file   Highest education level: Not on file  Occupational History   Not on file  Tobacco Use   Smoking status: Never   Smokeless tobacco: Never  Vaping Use   Vaping status: Never Used  Substance and Sexual Activity   Alcohol use: No    Alcohol/week: 0.0 standard drinks of alcohol   Drug use: No   Sexual activity: Not Currently    Birth control/protection: Surgical  Other Topics Concern   Not on file  Social History Narrative   Not on file   Social Drivers of Health   Financial Resource Strain: Low Risk  (05/06/2023)   Received from [facility]   Overall Financial Resource Strain (CARDIA)    Difficulty of Paying Living Expenses: Not hard at all  Food Insecurity: No Food Insecurity (05/06/2023)   [facility] from [facility]   Hunger Vital Sign    Worried About Running Out of Food in the Last Year: Never true    Ran Out of Food in the Last  Year: Never true  Transportation Needs: No Transportation Needs (05/06/2023)   Received from [facility] - Transportation    In the past 12 months, has lack of transportation kept you from medical appointments or from getting medications?: No    Lack of Transportation (Non-Medical): No  Physical Activity: Sufficiently Active (08/12/2022)   Exercise Vital Sign    Days of Exercise per Week: 7 days    Minutes of Exercise per Session: 30 min  Stress: No Stress Concern Present (08/12/2022)   [employer] of Occupational Health - Occupational Stress Questionnaire    Feeling of Stress : Not at all  Social  Connections: Moderately Integrated (08/12/2022)   Social Connection and Isolation Panel [NHANES]    Frequency of Communication with Friends and Family: More than three times a week    Frequency of Social Gatherings with Friends and Family: More than three times a week    Attends Religious Services: More than 4 times per year    Active Member of [employer] or Organizations: Yes    Attends Banker Meetings: More than 4 times per year    Marital Status: Widowed     Review of Systems  Constitutional:  Negative for appetite change and unexpected weight change.  HENT:  Negative for congestion and sinus pressure.   Respiratory:  Negative for cough, chest tightness and shortness of breath.   Cardiovascular:  Negative for chest pain, palpitations and leg swelling.  Gastrointestinal:  Negative for abdominal pain, diarrhea, nausea and vomiting.  Genitourinary:  Negative for difficulty urinating and dysuria.  Musculoskeletal:  Negative for joint swelling and myalgias.  Skin:  Negative for color change and rash.  Neurological:  Negative for dizziness and headaches.  Psychiatric/Behavioral:  Negative for agitation and dysphoric mood.        Objective:     BP 126/68   Pulse 80   Temp 98.3 F (36.8 C)   Resp 16   Ht 5\' 4"  (1.626 m)   Wt 222 lb 12.8 oz (101.1 kg)   LMP 09/10/2016 (Exact Date)   SpO2 97%   BMI 38.24 kg/m  Wt Readings from Last 3 Encounters:  06/26/23 222 lb 12.8 oz (101.1 kg)  12/10/22 235 lb 9.6 oz (106.9 kg)  09/06/22 239 lb 9.6 oz (108.7 kg)    Physical Exam Vitals reviewed.  Constitutional:      General: She is not in acute distress.    Appearance: Normal appearance.  HENT:     Head: Normocephalic and atraumatic.     Right Ear: External ear normal.     Left Ear: External ear normal.     Mouth/Throat:     Pharynx: No oropharyngeal exudate or posterior oropharyngeal erythema.  Eyes:     General: No scleral icterus.       Right eye: No discharge.         Left eye: No discharge.     Conjunctiva/sclera: Conjunctivae normal.  Neck:     Thyroid: No thyromegaly.  Cardiovascular:     Rate and Rhythm: Normal rate and regular rhythm.  Pulmonary:     Effort: No respiratory distress.     Breath sounds: Normal breath sounds. No wheezing.  Abdominal:     General: Bowel sounds are normal.     Palpations: Abdomen is soft.     Tenderness: There is no abdominal tenderness.  Musculoskeletal:        General: No swelling or tenderness.  Cervical back: Neck supple. No tenderness.  Lymphadenopathy:     Cervical: No cervical adenopathy.  Skin:    Findings: No erythema or rash.  Neurological:     Mental Status: She is alert.  Psychiatric:        Mood and Affect: Mood normal.        Behavior: Behavior normal.         Outpatient Encounter Medications as of 06/26/2023  Medication Sig   Cholecalciferol (VITAMIN D3) 50 MCG (2000 UT) TABS Take by mouth.   rosuvastatin (CRESTOR) 5 MG tablet Take 1 tablet (5 mg total) by mouth daily.   [DISCONTINUED] rosuvastatin (CRESTOR) 5 MG tablet Take 1 tablet by mouth once daily   No facility-administered encounter medications on file as of 06/26/2023.     Lab Results  Component Value Date   WBC 4.2 08/23/2022   HGB 13.5 08/23/2022   HCT 40.6 08/23/2022   PLT 257.0 08/23/2022   GLUCOSE 88 06/23/2023   CHOL 144 06/23/2023   TRIG 59.0 06/23/2023   HDL 48.70 06/23/2023   LDLCALC 83 06/23/2023   ALT 16 06/23/2023   AST 19 06/23/2023   NA 141 06/23/2023   K 4.0 06/23/2023   CL 105 06/23/2023   CREATININE 0.66 06/23/2023   BUN 10 06/23/2023   CO2 29 06/23/2023   TSH 1.31 08/23/2022   HGBA1C 5.9 06/23/2023    MM 3D SCREEN BREAST BILATERAL Result Date: 06/25/2022 CLINICAL DATA:  Screening. EXAM: DIGITAL SCREENING BILATERAL MAMMOGRAM WITH TOMOSYNTHESIS AND CAD TECHNIQUE: Bilateral screening digital craniocaudal and mediolateral oblique mammograms were obtained. Bilateral screening digital breast  tomosynthesis was performed. The images were evaluated with computer-aided detection. COMPARISON:  Previous exam(s). ACR Breast Density Category a: The breasts are almost entirely fatty. FINDINGS: There are no findings suspicious for malignancy. IMPRESSION: No mammographic evidence of malignancy. A result letter of this screening mammogram will be mailed directly to the patient. RECOMMENDATION: Screening mammogram in one year. (Code:SM-B-01Y) BI-RADS CATEGORY  1: Negative. Electronically Signed   By: Sande Brothers M.D.   On: 06/25/2022 14:04       Assessment & Plan:  Hypercholesterolemia Assessment & Plan: On crestor.  Tolerating.  Cholesterol improved.  Low cholesterol diet and exercise.  Follow lipid panel and liver function tests.   Lab Results  Component Value Date   CHOL 144 06/23/2023   HDL 48.70 06/23/2023   LDLCALC 83 06/23/2023   TRIG 59.0 06/23/2023   CHOLHDL 3 06/23/2023    Orders: -     Hepatic function panel; Future -     Basic metabolic panel; Future -     Lipid panel; Future -     Rosuvastatin Calcium; Take 1 tablet (5 mg total) by mouth daily.  Dispense: 90 tablet; Refill: 3  Hyperglycemia Assessment & Plan: Low carb diet and exercise.  Follow met b and a1c.  She is exercising. Has lost weight.  Lab Results  Component Value Date   HGBA1C 5.9 06/23/2023    Orders: -     Hemoglobin A1c; Future  Chest discomfort -     TSH; Future -     CBC with Differential/Platelet; Future  Aortic atherosclerosis (HCC) Assessment & Plan: Continue crestor.    Colon cancer screening Assessment & Plan: Last colonoscopy 07/2012 - diverticulosis in the sigmoid colon and internal hemorrhoids.  Discussed today. Overdue. Agreeable for placing another referral.   Orders: -     Ambulatory referral to Gastroenterology  Vitamin D deficiency Assessment &  Plan: Continue vitamin D supplements. Increase to 2000 units vitamin D3 q day.       Dale [place], MD

## 2023-06-26 NOTE — Assessment & Plan Note (Signed)
 Last colonoscopy 07/2012 - diverticulosis in the sigmoid colon and internal hemorrhoids.  Discussed today. Overdue. Agreeable for placing another referral.

## 2023-06-26 NOTE — Patient Instructions (Signed)
 Confirm you have vitamin D3 1000 unit tablets. If so, increase to two tablets per day.

## 2023-06-26 NOTE — Assessment & Plan Note (Addendum)
 Low carb diet and exercise.  Follow met b and a1c.  She is exercising. Has lost weight.  Lab Results  Component Value Date   HGBA1C 5.9 06/23/2023

## 2023-07-08 HISTORY — PX: ROOT CANAL: SHX2363

## 2023-07-19 LAB — LIPID PANEL
Cholesterol: 135
HDL: 50
LDL: 68
Triglycerides: 84 (ref 40–160)

## 2023-07-19 LAB — AMB RESULTS CONSOLE CBG: Glucose: 84

## 2023-07-19 LAB — POCT ABI - SCREENING FOR PILOT NO CHARGE
Left ABI: 1.31
Right ABI: 1.27

## 2023-07-19 NOTE — Progress Notes (Unsigned)
 This pt attended 07/19/23 screening event and BP was 125/75 and blood glucose was 84. Pt did not indicate any SDOH needs.  Further f/u to be scheduled per health equity protocol.

## 2023-08-13 ENCOUNTER — Ambulatory Visit

## 2023-09-24 NOTE — Progress Notes (Signed)
 The patient attended a screening event on 07/19/2023 where her BP screening results was 125/75, Blood Glucose 84 non-fasting. At the event the patient noted she has [employer] and does not smoke. Patient did not have any SDOH insecurities. Pt list pcp as Dr Dellar Fenton. Per chart review pt has a pcp and the last office visit was 06/26/2023 for hypercholesterolemia. The pt BP was 126/68 on 06/26/2023. According to chart pt is on low cholesterol diet and exercise. Chart review also indicates a future appt on 10/30/2023. No additional Health equity team support indicated at this time.

## 2023-10-21 ENCOUNTER — Encounter: Payer: Self-pay | Admitting: *Deleted

## 2023-10-22 ENCOUNTER — Ambulatory Visit: Admitting: *Deleted

## 2023-10-22 ENCOUNTER — Ambulatory Visit

## 2023-10-22 VITALS — Ht 64.0 in | Wt 215.0 lb

## 2023-10-22 DIAGNOSIS — Z0000 Encounter for general adult medical examination without abnormal findings: Secondary | ICD-10-CM | POA: Diagnosis not present

## 2023-10-22 NOTE — Patient Instructions (Signed)
 Nicole Hardy , Thank you for taking time out of your busy schedule to complete your Annual Wellness Visit with me. I enjoyed our conversation and look forward to speaking with you again next year. I, as well as your care team,  appreciate your ongoing commitment to your health goals. Please review the following plan we discussed and let me know if I can assist you in the future. Your Game plan/ To Do List    Referrals: If you haven't heard from the office you've been referred to, please reach out to them at the phone provided.  Remember to keep your consult with the doctor regarding a colonoscopy. Follow up Visits: Next Medicare AWV with our clinical staff: 10/27/24 @ 2:20   Have you seen your provider in the last 6 months (3 months if uncontrolled diabetes)? Yes Next Office Visit with your provider: 10/30/23  Clinician Recommendations:  Aim for 30 minutes of exercise or brisk walking, 6-8 glasses of water, and 5 servings of fruits and vegetables each day.       This is a list of the screening recommended for you and due dates:  Health Maintenance  Topic Date Due   Pneumococcal Vaccine for age over 59 (1 of 1 - PCV) Never done   Zoster (Shingles) Vaccine (1 of 2) Never done   Colon Cancer Screening  07/24/2022   COVID-19 Vaccine (5 - 2024-25 season) 12/08/2022   Flu Shot  11/07/2023   Medicare Annual Wellness Visit  10/21/2024   Mammogram  06/25/2025   DEXA scan (bone density measurement)  Completed   Hepatitis C Screening  Completed   Hepatitis B Vaccine  Aged Out   HPV Vaccine  Aged Out   Meningitis B Vaccine  Aged Out   DTaP/Tdap/Td vaccine  Discontinued    Advanced directives: (Copy Requested) Please bring a copy of your health care power of attorney and living will to the office to be added to your chart at your convenience. You can mail to [facility] [address] [place], [place] [address] or email to [email or web address] Advance Care Planning is  important because it:  [x]  Makes sure you receive the medical care that is consistent with your values, goals, and preferences  [x]  It provides guidance to your family and loved ones and reduces their decisional burden about whether or not they are making the right decisions based on your wishes.

## 2023-10-22 NOTE — Progress Notes (Signed)
 Subjective:   Nicole Hardy is a 73 y.o. who presents for a Medicare Wellness preventive visit.  As a reminder, Annual Wellness Visits don't include a physical exam, and some assessments may be limited, especially if this visit is performed virtually. We may recommend an in-person follow-up visit with your provider if needed.  Visit Complete: Virtual I connected with  Nicole Hardy on 10/22/23 by a audio enabled telemedicine application and verified that I am speaking with the correct person using two identifiers.  Patient Location: Home  Provider Location: Home Office  I discussed the limitations of evaluation and management by telemedicine. The patient expressed understanding and agreed to proceed.  Vital Signs: Because this visit was a virtual/telehealth visit, some criteria may be missing or patient reported. Any vitals not documented were not able to be obtained and vitals that have been documented are patient reported.  VideoDeclined- This patient declined Librarian, academic. Therefore the visit was completed with audio only.  Persons Participating in Visit: Patient.  AWV Questionnaire: No: Patient Medicare AWV questionnaire was not completed prior to this visit.  Cardiac Risk Factors include: advanced age (>46men, >38 women);dyslipidemia;obesity (BMI >30kg/m2)     Objective:    Today's Vitals   10/22/23 1307  Weight: 215 lb (97.5 kg)  Height: 5' 4 (1.626 m)   Body mass index is 36.9 kg/m.     10/22/2023    1:22 PM 08/08/2021    2:48 PM 08/04/2020   12:38 PM 08/04/2019   11:42 AM 08/03/2018    1:40 PM 04/24/2015    9:31 AM 04/18/2015    9:11 AM  Advanced Directives  Does Patient Have a Medical Advance Directive? Yes Yes Yes No No No  Yes;No   Type of Estate agent of [place];Living will Healthcare Power of [employer] of [place];Living will      Does patient want to make changes to medical advance directive?   No - Patient declined No - Patient declined No - Patient declined   No - Patient declined   Copy of Healthcare Power of Attorney in Chart? No - copy requested No - copy requested No - copy requested      Would patient like information on creating a medical advance directive?     No - Patient declined        Data saved with a previous flowsheet row definition    Current Medications (verified) Outpatient Encounter Medications as of 10/22/2023  Medication Sig   Cholecalciferol (VITAMIN D3) 50 MCG (2000 UT) TABS Take by mouth.   rosuvastatin  (CRESTOR ) 5 MG tablet Take 1 tablet (5 mg total) by mouth daily.   No facility-administered encounter medications on file as of 10/22/2023.    Allergies (verified) Metronidazole   History: Past Medical History:  Diagnosis Date   Complication of anesthesia    Degenerative disc disease, cervical    cervical   Hypercholesterolemia    Meralgia paraesthetica    Migraines    Nephrolithiasis    Neurodermatitis    Obesity    Osteopenia    PONV (postoperative nausea and vomiting)    Vitamin D  deficiency    Past Surgical History:  Procedure Laterality Date   CHOLECYSTECTOMY N/A 04/24/2015   Procedure: LAPAROSCOPIC CHOLECYSTECTOMY WITH INTRAOPERATIVE CHOLANGIOGRAM;  Surgeon: Reyes LELON Cota, MD;  Location: [facility] ORS;  Service: General;  Laterality: N/A;   EXTRACORPOREAL SHOCK WAVE LITHOTRIPSY Right 11/17/2014   Procedure: EXTRACORPOREAL SHOCK WAVE LITHOTRIPSY (ESWL);  Surgeon: Ozell  JONELLE Burkes, MD;  Location: [facility] ORS;  Service: Urology;  Laterality: Right;   EXTRACORPOREAL SHOCK WAVE LITHOTRIPSY Left 12/15/2014   Procedure: EXTRACORPOREAL SHOCK WAVE LITHOTRIPSY (ESWL);  Surgeon: Ozell JONELLE Burkes, MD;  Location: [facility] ORS;  Service: Urology;  Laterality: Left;   laparoscopic surgery  04/08/1988   nephrolithiasis (Dr Nettie)   LITHOTRIPSY  2010, 1980's   pessary  2003, 2016   ROOT CANAL  07/2023   TOTAL VAGINAL HYSTERECTOMY  2016   Family History   Problem Relation Age of Onset   Hypertension Mother    Hypertension Father    Asthma Sister    Hypertension Sister    Diabetes Sister    Breast cancer Sister 29   Rectal cancer Sister    Social History   Socioeconomic History   Marital status: Widowed    Spouse name: Not on file   Number of children: 1   Years of education: Not on file   Highest education level: Not on file  Occupational History   Not on file  Tobacco Use   Smoking status: Never   Smokeless tobacco: Never  Vaping Use   Vaping status: Never Used  Substance and Sexual Activity   Alcohol use: No    Alcohol/week: 0.0 standard drinks of alcohol   Drug use: No   Sexual activity: Not Currently    Birth control/protection: Surgical  Other Topics Concern   Not on file  Social History Narrative   Not on file   Social Drivers of Health   Financial Resource Strain: Low Risk  (10/22/2023)   Overall Financial Resource Strain (CARDIA)    Difficulty of Paying Living Expenses: Not hard at all  Food Insecurity: No Food Insecurity (10/22/2023)   Hunger Vital Sign    Worried About Running Out of Food in the Last Year: Never true    Ran Out of Food in the Last Year: Never true  Transportation Needs: No Transportation Needs (10/22/2023)   PRAPARE - Administrator, Civil Service (Medical): No    Lack of Transportation (Non-Medical): No  Physical Activity: Sufficiently Active (10/22/2023)   Exercise Vital Sign    Days of Exercise per Week: 6 days    Minutes of Exercise per Session: 60 min  Stress: No Stress Concern Present (10/22/2023)   [employer] of Occupational Health - Occupational Stress Questionnaire    Feeling of Stress: Not at all  Social Connections: Moderately Integrated (10/22/2023)   Social Connection and Isolation Panel    Frequency of Communication with Friends and Family: More than three times a week    Frequency of Social Gatherings with Friends and Family: More than three times a week     Attends Religious Services: More than 4 times per year    Active Member of [employer] or Organizations: Yes    Attends Banker Meetings: More than 4 times per year    Marital Status: Widowed    Tobacco Counseling Counseling given: Not Answered    Clinical Intake:  Pre-visit preparation completed: Yes  Pain : No/denies pain     BMI - recorded: 36.9 Nutritional Status: BMI > 30  Obese Nutritional Risks: None Diabetes: No  Lab Results  Component Value Date   HGBA1C 5.9 06/23/2023   HGBA1C 5.9 01/07/2023   HGBA1C 5.9 07/26/2022     How often do you need to have someone help you when you read instructions, pamphlets, or other written materials from your doctor or pharmacy?: 1 -  Never  Interpreter Needed?: No  Information entered by :: R. Talena Neira LPN   Activities of Daily Living     10/22/2023    1:09 PM  In your present state of health, do you have any difficulty performing the following activities:  Hearing? 0  Vision? 0  Comment glasses  Difficulty concentrating or making decisions? 0  Walking or climbing stairs? 1  Dressing or bathing? 0  Doing errands, shopping? 0  Preparing Food and eating ? N  Using the Toilet? N  In the past six months, have you accidently leaked urine? N  Do you have problems with loss of bowel control? N  Managing your Medications? N  Managing your Finances? N  Housekeeping or managing your Housekeeping? N    Patient Care Team: Glendia Shad, MD as PCP - General (Internal Medicine) Dessa Reyes ORN, MD as Consulting Physician (General Surgery) Glendia Shad, MD as Referring Physician (Internal Medicine)  I have updated your Care Teams any recent Medical Services you may have received from other providers in the past year.     Assessment:   This is a routine wellness examination for [place].  Hearing/Vision screen Hearing Screening - Comments:: No issues Vision Screening - Comments:: glasses   Goals Addressed              This Visit's Progress    Patient Stated       Wants to continue to go to her exercise classes        Depression Screen     10/22/2023    1:16 PM 08/12/2022    1:37 PM 07/31/2022   11:28 AM 03/29/2022    7:57 AM 11/27/2021   10:00 AM 08/08/2021    2:54 PM 07/26/2021    9:10 AM  PHQ 2/9 Scores  PHQ - 2 Score 0 0 0 0 0 0 0  PHQ- 9 Score 0          Fall Risk     10/22/2023    1:13 PM 09/05/2022    9:57 AM 08/23/2022    9:35 AM 07/31/2022   11:28 AM 03/29/2022    7:57 AM  Fall Risk   Falls in the past year? 0 0 0 0 0  Number falls in past yr: 0 0 0 0 0  Injury with Fall? 0 0 0 0 0  Risk for fall due to : No Fall Risks No Fall Risks No Fall Risks No Fall Risks No Fall Risks  Follow up Falls evaluation completed;Falls prevention discussed Falls prevention discussed;Falls evaluation completed Falls evaluation completed Falls evaluation completed Falls evaluation completed      Data saved with a previous flowsheet row definition    MEDICARE RISK AT HOME:  Medicare Risk at Home Any stairs in or around the home?: Yes If so, are there any without handrails?: No Home free of loose throw rugs in walkways, pet beds, electrical cords, etc?: Yes Adequate lighting in your home to reduce risk of falls?: Yes Life alert?: No Use of a cane, walker or w/c?: Yes Grab bars in the bathroom?: No Shower chair or bench in shower?: No Elevated toilet seat or a handicapped toilet?: Yes  TIMED UP AND GO:  Was the test performed?  No  Cognitive Function: 6CIT completed        10/22/2023    1:23 PM 08/12/2022    1:38 PM 08/08/2021    3:06 PM 08/04/2019   12:15 PM 08/03/2018    1:42 PM  6CIT Screen  What Year? 0 points 0 points 0 points 0 points 0 points  What month? 0 points 0 points 0 points 0 points 0 points  What time? 0 points 0 points 0 points 0 points 0 points  Count back from 20 0 points 0 points 0 points  0 points  Months in reverse 0 points 0 points 0 points  0 points   Repeat phrase 0 points -- 0 points  0 points  Total Score 0 points  0 points  0 points    Immunizations Immunization History  Administered Date(s) Administered   PFIZER(Purple Top)SARS-COV-2 Vaccination 08/03/2019, 08/23/2019, 04/03/2020   PPD Test 12/25/2015, 11/20/2016, 12/31/2021   Pfizer Covid-19 Vaccine Bivalent Booster 110yrs & up 03/16/2021    Screening Tests Health Maintenance  Topic Date Due   Pneumococcal Vaccine: 50+ Years (1 of 1 - PCV) Never done   Zoster Vaccines- Shingrix (1 of 2) Never done   Colonoscopy  07/24/2022   COVID-19 Vaccine (5 - 2024-25 season) 12/08/2022   Medicare Annual Wellness (AWV)  08/12/2023   INFLUENZA VACCINE  11/07/2023   MAMMOGRAM  06/25/2025   DEXA SCAN  Completed   Hepatitis C Screening  Completed   Hepatitis B Vaccines  Aged Out   HPV VACCINES  Aged Out   Meningococcal B Vaccine  Aged Out   DTaP/Tdap/Td  Discontinued    Health Maintenance  Health Maintenance Due  Topic Date Due   Pneumococcal Vaccine: 50+ Years (1 of 1 - PCV) Never done   Zoster Vaccines- Shingrix (1 of 2) Never done   Colonoscopy  07/24/2022   COVID-19 Vaccine (5 - 2024-25 season) 12/08/2022   Medicare Annual Wellness (AWV)  08/12/2023   Health Maintenance Items Addressed: Patient declines all vaccines. Patient has an appointment scheduled  with Dr. Aundria at [facility] for a colonoscopy consult 11/14/23.   Additional Screening:  Vision Screening: Recommended annual ophthalmology exams for early detection of glaucoma and other disorders of the eye. [employer] Would you like a referral to an eye doctor? No    Dental Screening: Recommended annual dental exams for proper oral hygiene  Community Resource Referral / Chronic Care Management: CRR required this visit?  No   CCM required this visit?  No   Plan:    I have personally reviewed and noted the following in the patient's chart:   Medical and social history Use of alcohol, tobacco or  illicit drugs  Current medications and supplements including opioid prescriptions. Patient is not currently taking opioid prescriptions. Functional ability and status Nutritional status Physical activity Advanced directives List of other physicians Hospitalizations, surgeries, and ER visits in previous 12 months Vitals Screenings to include cognitive, depression, and falls Referrals and appointments  In addition, I have reviewed and discussed with patient certain preventive protocols, quality metrics, and best practice recommendations. A written personalized care plan for preventive services as well as general preventive health recommendations were provided to patient.   Angeline Fredericks, LPN   2/83/7974   After Visit Summary: (MyChart) Due to this being a telephonic visit, the after visit summary with patients personalized plan was offered to patient via MyChart   Notes: Nothing significant to report at this time.

## 2023-10-28 ENCOUNTER — Other Ambulatory Visit

## 2023-10-30 ENCOUNTER — Encounter: Admitting: Nurse Practitioner

## 2023-10-30 ENCOUNTER — Encounter: Admitting: Internal Medicine

## 2023-11-10 NOTE — Progress Notes (Addendum)
 PATIENT PROFILE: Nicole Hardy is a 73 y.o. female who presents to the [facility] for consultation at the request of Dr. Allena Hamilton for chief complaint of colon cancer screening and chronic constipation.   HISTORY OF PRESENT ILLNESS   Nicole Hardy presents to the [place] [facility] clinic at the request of her PCP for chief complaint of colon cancer screening and chronic constipation. She presents to the clinic by herself. She is due for repeat colonoscopy at this time. Last colonoscopy performed April 2014 by Dr. Christ showed sigmoid diverticulosis and otherwise normal examined colon. No personal history of adenomas, but she does have a family history of rectal cancer in her sister diagnosed in her early 48s. She has hx of chronic constipation and typically has a BM every day with help of Miralax 17 grams once daily. She has been doing daily Miralax for years and this works very well for her. She denies any issues with hematochezia, melena, fecal urgency, or fecal incontinence. Appetite and diet are stable without any unintentional weight loss. She denies any UGI symptoms such as heartburn, acid reflux, esophageal dysphagia, odynophagia, early satiety, hoarseness, or epigastric abdominal pain. No previous adverse reactions to anesthesia. No other questions or concerns at this time.    GENERAL REVIEW OF SYSTEMS: General: negative for - fever, chills, weight gain, weight loss, fatigue Head: no injury, migraines, headaches Eyes: no jaundice, itching, dryness, tearing, redness, vision changes Nose: no injury, bleeding Mouth/Throat: no oral ulcers, swollen neck, dry mouth, sore throat, hoarseness Endocrine: no heat/cold intolerance Respiratory: no cough, wheezing, SOB Cardiovascular: no chest pain, palpitations GI: see HPI Musculoskeletal: no joint swelling, muscle/joint pain  Neurological: no seizures, syncope, dizziness, numbness/tingling Skin: no rashes, itching Hematological and  Lymphatic: easy bruising, easy bleeding  MEDICATIONS: Outpatient Encounter Medications as of 11/10/2023  Medication Sig Dispense Refill  . cholecalciferol (VITAMIN D3) 2,000 unit capsule Take 2,000 Units by mouth once daily    . rosuvastatin  (CRESTOR ) 5 MG tablet Take 5 mg by mouth once daily    . sodium, potassium, and magnesium (SUPREP) oral solution Take 1 Bottle by mouth as directed One kit contains 2 bottles.  Take both bottles at the times instructed by your provider. 354 mL 0   No facility-administered encounter medications on file as of 11/10/2023.    ALLERGIES: Patient has no known allergies.  PAST MEDICAL HISTORY: Past Medical History:  Diagnosis Date  . Hyperlipidemia   . Kidney stones    1990's    PAST SURGICAL HISTORY: Past Surgical History:  Procedure Laterality Date  . CHOLECYSTECTOMY  2016  . HYSTERECTOMY VAGINAL  07/2014  . CATARACT EXTRACTION  09/2020     FAMILY HISTORY: Family History  Problem Relation Name Age of Onset  . High blood pressure (Hypertension) Mother    . Arthritis Mother    . Heart murmur Mother    . High blood pressure (Hypertension) Father         Cardiac bypass  . Other Father         twisted colon surgery  . Breast cancer Sister       SOCIAL HISTORY: Social History   Socioeconomic History  . Marital status: Widowed  Tobacco Use  . Smoking status: Never    Passive exposure: Never  . Smokeless tobacco: Never  Substance and Sexual Activity  . Alcohol use: Never  . Drug use: Never   Social Drivers of Corporate investment banker Strain: Low Risk  (  10/22/2023)   [facility]   Overall Financial Resource Strain (CARDIA)   . How hard is it for you to pay for the very basics like food, housing, medical care, and heating?: Not hard at all  Food Insecurity: No Food Insecurity (10/22/2023)   Received from [facility]   Hunger Vital Sign   . Within the past 12 months, you worried that your food would run out before you  got the money to buy more.: Never true   . Within the past 12 months, the food you bought just didn't last and you didn't have money to get more.: Never true  Transportation Needs: No Transportation Needs (10/22/2023)   Received from [facility] - Transportation   . In the past 12 months, has lack of transportation kept you from medical appointments or from getting medications?: No   . In the past 12 months, has lack of transportation kept you from meetings, work, or from getting things needed for daily living?: No    Vitals:   11/10/23 1024  BP: 134/85  Pulse: 78  Weight: 99 kg (218 lb 3.2 oz)  Height: 167.6 cm (5' 6)    Wt Readings from Last 3 Encounters:  11/10/23 99 kg (218 lb 3.2 oz)  05/06/23 (!) 101.6 kg (224 lb)  01/01/23 (!) 106.4 kg (234 lb 9.6 oz)     PHYSICAL EXAM: Pleasant, non-toxic appearing female on exam bench. Answers all questions appropriately and thoroughly.  General: NAD, alert and oriented x 4 HEENT: PEERLA, EOMI, anciteric  Neck: supple, no JVD or thyromegaly. No lymphadenopathy.  Respiratory: CTA bilaterally, no wheezes, crackles, or other adventitious sounds Cardiac: RRR, no murmur, rub, or gallop  GI: soft, normal bowel sounds, no TTP, no HSM, no rebound or guarding Msk: no edema, well perfused with 2+ pulses, Skin: Skin color, texture, turgor normal, no rashes or lesions Lymph: no LAD Neuro: Grossly intact   REVIEW OF DATA: I have reviewed the following data today:  Previous OV notes with PCP reviewed - see HPI  Summary of GI Procedures:  CSY 07/23/2012 Schuyler) - sigmoid diverticulosis, otherwise normal examined colon  CSY 11/19/2002 Nikki) - normal examined colon  ASSESSMENT AND PLAN: Nicole Hardy is a 73 y.o. female presenting for an initial consultation.   Diagnoses and all orders for this visit:  Family history of colon cancer -     Ambulatory Referral to Colonoscopy  Chronic constipation  Sigmoid  diverticulosis  Obesity (BMI 35.0-39.9 without comorbidity), unspecified  Other orders -     sodium, potassium, and magnesium (SUPREP) oral solution; Take 1 Bottle by mouth as directed One kit contains 2 bottles.  Take both bottles at the times instructed by your provider.   73 y/o AA female with a PMH of obesity (BMI 35), aortic atherosclerosis, Vitamin D  deficiency, HLD, headache disorder, osteopenia, mild OSA not on CPAP, and OA presents to the [facility] clinic for chief complaint of high-risk colorectal cancer screening  Family history of colon cancer - sister (early 53s)  Chronic constipation - well-controlled with once daily Miralax   Sigmoid diverticulosis  - Overdue for colon cancer screening. Last CSY 07/2012 showed sigmoid diverticulosis and otherwise normal examined colon.  - No red flag symptoms or LGI concerns - Continue Miralax 17 grams once daily for management of constipation - Schedule colonoscopy at [facility] with Dr. Aundria with Suprep bowel prep - Further recs after colonoscopy   I reviewed the risks (including bleeding,  perforation, infection, anesthesia complications, cardiac/respiratory complications), benefits and alternatives of colonoscopy. Patient consents to proceed. Denies CP/SOB/blood thinners.   ADDENDUM 11/17/2023 2200  Patient reports her middle sister was diagnosed with colon cancer in her 87s and not rectal cancer. Note and order updated.    Attestation Statement:   I personally performed the service, non-incident to. [facility])   TWYLA JONETTE PRIMMER, PA     JONETTE PRIMMER, PA-C [facility], Gastroenterology [address] [place], [place] [address] [phone]

## 2023-11-12 ENCOUNTER — Other Ambulatory Visit (INDEPENDENT_AMBULATORY_CARE_PROVIDER_SITE_OTHER)

## 2023-11-12 DIAGNOSIS — E7800 Pure hypercholesterolemia, unspecified: Secondary | ICD-10-CM

## 2023-11-12 DIAGNOSIS — R0789 Other chest pain: Secondary | ICD-10-CM | POA: Diagnosis not present

## 2023-11-12 DIAGNOSIS — R739 Hyperglycemia, unspecified: Secondary | ICD-10-CM

## 2023-11-12 LAB — LIPID PANEL
Cholesterol: 155 mg/dL (ref 0–200)
HDL: 50.2 mg/dL (ref >39.00)
LDL Cholesterol: 93 mg/dL (ref 0–99)
NonHDL: 104.69
Total CHOL/HDL Ratio: 3
Triglycerides: 58 mg/dL (ref 0.0–149.0)
VLDL: 11.6 mg/dL (ref 0.0–40.0)

## 2023-11-12 LAB — CBC WITH DIFFERENTIAL/PLATELET
Basophils Absolute: 0 K/uL (ref 0.0–0.1)
Basophils Relative: 0.6 % (ref 0.0–3.0)
Eosinophils Absolute: 0 K/uL (ref 0.0–0.7)
Eosinophils Relative: 0.6 % (ref 0.0–5.0)
HCT: 42.3 % (ref 36.0–46.0)
Hemoglobin: 13.7 g/dL (ref 12.0–15.0)
Lymphocytes Relative: 33.7 % (ref 12.0–46.0)
Lymphs Abs: 1.5 K/uL (ref 0.7–4.0)
MCHC: 32.4 g/dL (ref 30.0–36.0)
MCV: 91.7 fl (ref 78.0–100.0)
Monocytes Absolute: 0.2 K/uL (ref 0.1–1.0)
Monocytes Relative: 4.1 % (ref 3.0–12.0)
Neutro Abs: 2.7 K/uL (ref 1.4–7.7)
Neutrophils Relative %: 61 % (ref 43.0–77.0)
Platelets: 271 K/uL (ref 150.0–400.0)
RBC: 4.61 Mil/uL (ref 3.87–5.11)
RDW: 13.5 % (ref 11.5–15.5)
WBC: 4.4 K/uL (ref 4.0–10.5)

## 2023-11-12 LAB — BASIC METABOLIC PANEL WITH GFR
BUN: 13 mg/dL (ref 6–23)
CO2: 31 meq/L (ref 19–32)
Calcium: 9.6 mg/dL (ref 8.4–10.5)
Chloride: 104 meq/L (ref 96–112)
Creatinine, Ser: 0.71 mg/dL (ref 0.40–1.20)
GFR: 85.54 mL/min (ref >60.00)
Glucose, Bld: 90 mg/dL (ref 70–99)
Potassium: 4.3 meq/L (ref 3.5–5.1)
Sodium: 141 meq/L (ref 135–145)

## 2023-11-12 LAB — HEPATIC FUNCTION PANEL
ALT: 17 U/L (ref 0–35)
AST: 21 U/L (ref 0–37)
Albumin: 4.2 g/dL (ref 3.5–5.2)
Alkaline Phosphatase: 72 U/L (ref 39–117)
Bilirubin, Direct: 0.2 mg/dL (ref 0.0–0.3)
Total Bilirubin: 1.1 mg/dL (ref 0.2–1.2)
Total Protein: 7.6 g/dL (ref 6.0–8.3)

## 2023-11-12 LAB — TSH: TSH: 1.55 u[IU]/mL (ref 0.35–5.50)

## 2023-11-12 LAB — HEMOGLOBIN A1C: Hgb A1c MFr Bld: 6.1 % (ref 4.6–6.5)

## 2023-11-13 ENCOUNTER — Ambulatory Visit: Payer: Self-pay | Admitting: Internal Medicine

## 2023-11-14 ENCOUNTER — Encounter: Admitting: Internal Medicine

## 2023-11-17 ENCOUNTER — Encounter: Payer: Self-pay | Admitting: Internal Medicine

## 2023-11-17 ENCOUNTER — Telehealth: Payer: Self-pay

## 2023-11-17 ENCOUNTER — Ambulatory Visit (INDEPENDENT_AMBULATORY_CARE_PROVIDER_SITE_OTHER): Admitting: Internal Medicine

## 2023-11-17 VITALS — BP 118/64 | HR 66 | Temp 97.6°F | Ht 64.0 in | Wt 210.2 lb

## 2023-11-17 DIAGNOSIS — E7800 Pure hypercholesterolemia, unspecified: Secondary | ICD-10-CM

## 2023-11-17 DIAGNOSIS — E559 Vitamin D deficiency, unspecified: Secondary | ICD-10-CM | POA: Diagnosis not present

## 2023-11-17 DIAGNOSIS — N63 Unspecified lump in unspecified breast: Secondary | ICD-10-CM | POA: Diagnosis not present

## 2023-11-17 DIAGNOSIS — R739 Hyperglycemia, unspecified: Secondary | ICD-10-CM

## 2023-11-17 DIAGNOSIS — Z0000 Encounter for general adult medical examination without abnormal findings: Secondary | ICD-10-CM

## 2023-11-17 DIAGNOSIS — F439 Reaction to severe stress, unspecified: Secondary | ICD-10-CM

## 2023-11-17 DIAGNOSIS — I700 Atherosclerosis of aorta: Secondary | ICD-10-CM

## 2023-11-17 NOTE — Assessment & Plan Note (Signed)
 On crestor .  Tolerating.  Cholesterol improved.  Low cholesterol diet and exercise.  Follow lipid panel and liver function tests.   Lab Results  Component Value Date   CHOL 155 11/12/2023   HDL 50.20 11/12/2023   LDLCALC 93 11/12/2023   TRIG 58.0 11/12/2023   CHOLHDL 3 11/12/2023

## 2023-11-17 NOTE — Telephone Encounter (Signed)
 Copied from CRM [id number]. Topic: Clinical - Medical Advice >> Nov 17, 2023 12:03 PM Leotis ORN wrote: Reason for CRM: patient just left appointment and stated provider suspects she may have a hernia- patient is wanting to know if she has any limitations or exercise restrictions ( there was no AVS to reference at the time of the call) patient call back [phone]

## 2023-11-17 NOTE — Progress Notes (Addendum)
 Subjective:    [name], female    DOB: 09-Sep-1950, 73 y.o.   MRN: [id number]  Patient here for  Chief Complaint  Patient presents with   Annual Exam    HPI Here for a physical exam.  Evaluated for chest pain 08/2022. Saw Dr Florencio 11/07/22 - recommended echo. ECHO 12/05/22 - normal LF function, normal right ventricular systolic function, mild TR and PR with EF 55%. Had f/u with cardiology 12/2023 - stable. Saw pulmonary 01/01/24 - evaluation for sleep apnea. Saw GI 11/10/23 - recommended colonoscopy. Scheduled for 11/19/23. Breathing overall stable. Weight at home - 208lbs. No chest pain reported. No abdominal pain or bowel change reported. Has noticed palpable area - 3:00 right breast.    Past Medical History:  Diagnosis Date   Complication of anesthesia    Degenerative disc disease, cervical    cervical   Hypercholesterolemia    Meralgia paraesthetica    Migraines    Nephrolithiasis    Neurodermatitis    Obesity    Osteopenia    PONV (postoperative nausea and vomiting)    Vitamin D  deficiency    Past Surgical History:  Procedure Laterality Date   CHOLECYSTECTOMY N/A 04/24/2015   Procedure: LAPAROSCOPIC CHOLECYSTECTOMY WITH INTRAOPERATIVE CHOLANGIOGRAM;  Surgeon: Reyes LELON Cota, MD;  Location: [facility];  Service: General;  Laterality: N/A;   COLONOSCOPY N/A 11/19/2023   Procedure: COLONOSCOPY;  Surgeon: Toledo, Ladell POUR, MD;  Location: [facility];  Service: Gastroenterology;  Laterality: N/A;   EXTRACORPOREAL SHOCK WAVE LITHOTRIPSY Right 11/17/2014   Procedure: EXTRACORPOREAL SHOCK WAVE LITHOTRIPSY (ESWL);  Surgeon: Ozell JONELLE Burkes, MD;  Location: [facility];  Service: Urology;  Laterality: Right;   EXTRACORPOREAL SHOCK WAVE LITHOTRIPSY Left 12/15/2014   Procedure: EXTRACORPOREAL SHOCK WAVE LITHOTRIPSY (ESWL);  Surgeon: Ozell JONELLE Burkes, MD;  Location: [facility];  Service: Urology;  Laterality: Left;   laparoscopic surgery  04/08/1988   nephrolithiasis (Dr  Nettie)   LITHOTRIPSY  2010, 1980's   pessary  2003, 2016   ROOT CANAL  07/2023   TOTAL VAGINAL HYSTERECTOMY  2016   Family History  Problem Relation Age of Onset   Hypertension Mother    Hypertension Father    Asthma Sister    Hypertension Sister    Diabetes Sister    Breast cancer Sister 47   Rectal cancer Sister    Social History   Socioeconomic History   Marital status: Widowed    Spouse name: Not on file   Number of children: 1   Years of education: Not on file   Highest education level: Not on file  Occupational History   Not on file  Tobacco Use   Smoking status: Never   Smokeless tobacco: Never  Vaping Use   Vaping status: Never Used  Substance and Sexual Activity   Alcohol use: No    Alcohol/week: 0.0 standard drinks of alcohol   Drug use: No   Sexual activity: Not Currently    Birth control/protection: Surgical  Other Topics Concern   Not on file  Social History Narrative   Not on file   Social Drivers of Health   Financial Resource Strain: Low Risk  (10/22/2023)   Overall Financial Resource Strain (CARDIA)    Difficulty of Paying Living Expenses: Not hard at all  Food Insecurity: No Food Insecurity (10/22/2023)   Hunger Vital Sign    Worried About Running Out of Food in the Last Year: Never true    Ran Out of  Food in the Last Year: Never true  Transportation Needs: No Transportation Needs (10/22/2023)   PRAPARE - Administrator, Civil Service (Medical): No    Lack of Transportation (Non-Medical): No  Physical Activity: Sufficiently Active (10/22/2023)   Exercise Vital Sign    Days of Exercise per Week: 6 days    Minutes of Exercise per Session: 60 min  Stress: No Stress Concern Present (10/22/2023)   [employer] of Occupational Health - Occupational Stress Questionnaire    Feeling of Stress: Not at all  Social Connections: Moderately Integrated (10/22/2023)   Social Connection and Isolation Panel    Frequency of Communication with  Friends and Family: More than three times a week    Frequency of Social Gatherings with Friends and Family: More than three times a week    Attends Religious Services: More than 4 times per year    Active Member of [employer] or Organizations: Yes    Attends Banker Meetings: More than 4 times per year    Marital Status: Widowed     Review of Systems  Constitutional:  Negative for appetite change and unexpected weight change.  HENT:  Negative for congestion, sinus pressure and sore throat.   Eyes:  Negative for pain and visual disturbance.  Respiratory:  Negative for cough, chest tightness and shortness of breath.   Cardiovascular:  Negative for chest pain, palpitations and leg swelling.  Gastrointestinal:  Negative for abdominal pain, diarrhea, nausea and vomiting.  Genitourinary:  Negative for difficulty urinating and dysuria.  Musculoskeletal:  Negative for joint swelling and myalgias.  Skin:  Negative for color change and rash.  Neurological:  Negative for dizziness and headaches.  Hematological:  Negative for adenopathy. Does not bruise/bleed easily.  Psychiatric/Behavioral:  Negative for agitation and dysphoric mood.        Objective:     BP 118/64 (BP Location: Left Arm, Patient Position: Sitting, Cuff Size: Normal)   Pulse 66   Temp 97.6 F (36.4 C) (Oral)   Ht 5' 4 (1.626 m)   Wt 210 lb 3.2 oz (95.3 kg)   LMP 09/10/2016 (Exact Date)   SpO2 96%   BMI 36.08 kg/m  Wt Readings from Last 3 Encounters:  11/19/23 208 lb (94.3 kg)  11/17/23 210 lb 3.2 oz (95.3 kg)  10/22/23 215 lb (97.5 kg)    Physical Exam Vitals reviewed.  Constitutional:      General: She is not in acute distress.    Appearance: Normal appearance.  HENT:     Head: Normocephalic and atraumatic.     Right Ear: External ear normal.     Left Ear: External ear normal.     Mouth/Throat:     Pharynx: No oropharyngeal exudate or posterior oropharyngeal erythema.  Eyes:     General: No  scleral icterus.       Right eye: No discharge.        Left eye: No discharge.     Conjunctiva/sclera: Conjunctivae normal.  Neck:     Thyroid : No thyromegaly.  Cardiovascular:     Rate and Rhythm: Normal rate and regular rhythm.  Pulmonary:     Effort: No respiratory distress.     Breath sounds: Normal breath sounds. No wheezing.     Comments: Breasts - no nipple discharge or nipple retraction present. Increased palpable fullness - 3:00 right breast. No other nodules or axillary adenopathy palpate.  Abdominal:     General: Bowel sounds are normal.  Palpations: Abdomen is soft.     Tenderness: There is no abdominal tenderness.  Musculoskeletal:        General: No swelling or tenderness.     Cervical back: Neck supple. No tenderness.  Lymphadenopathy:     Cervical: No cervical adenopathy.  Skin:    Findings: No erythema or rash.  Neurological:     Mental Status: She is alert.  Psychiatric:        Mood and Affect: Mood normal.        Behavior: Behavior normal.         Outpatient Encounter Medications as of 11/17/2023  Medication Sig   Cholecalciferol (VITAMIN D3) 50 MCG (2000 UT) TABS Take by mouth.   rosuvastatin  (CRESTOR ) 5 MG tablet Take 1 tablet (5 mg total) by mouth daily.   No facility-administered encounter medications on file as of 11/17/2023.     Lab Results  Component Value Date   WBC 4.4 11/12/2023   HGB 13.7 11/12/2023   HCT 42.3 11/12/2023   PLT 271.0 11/12/2023   GLUCOSE 90 11/12/2023   CHOL 155 11/12/2023   TRIG 58.0 11/12/2023   HDL 50.20 11/12/2023   LDLCALC 93 11/12/2023   ALT 17 11/12/2023   AST 21 11/12/2023   NA 141 11/12/2023   K 4.3 Slightly Hemolyze... 11/12/2023   CL 104 11/12/2023   CREATININE 0.71 11/12/2023   BUN 13 11/12/2023   CO2 31 11/12/2023   TSH 1.55 11/12/2023   HGBA1C 6.1 11/12/2023    MM 3D SCREENING MAMMOGRAM BILATERAL BREAST Result Date: 06/27/2023 CLINICAL DATA:  Screening. EXAM: DIGITAL SCREENING BILATERAL  MAMMOGRAM WITH TOMOSYNTHESIS AND CAD TECHNIQUE: Bilateral screening digital craniocaudal and mediolateral oblique mammograms were obtained. Bilateral screening digital breast tomosynthesis was performed. The images were evaluated with computer-aided detection. COMPARISON:  Previous exam(s). ACR Breast Density Category a: The breasts are almost entirely fatty. FINDINGS: There are no findings suspicious for malignancy. IMPRESSION: No mammographic evidence of malignancy. A result letter of this screening mammogram will be mailed directly to the patient. RECOMMENDATION: Screening mammogram in one year. (Code:[id number]-B-01Y) BI-RADS CATEGORY  1: Negative. Electronically Signed   By: Rosaline Collet M.D.   On: 06/27/2023 15:34       Assessment & Plan:  Routine general medical examination at a health care facility  Health care maintenance Assessment & Plan: Physical today 11/17/23.  Previously referred back to gyn for vaginal exam/pessary maintenance.   Mammogram 06/26/23 - Birads I.  Planning for colonoscopy this week.    Hypercholesterolemia Assessment & Plan: On crestor .  Tolerating.  Cholesterol improved.  Low cholesterol diet and exercise.  Follow lipid panel and liver function tests.   Lab Results  Component Value Date   CHOL 155 11/12/2023   HDL 50.20 11/12/2023   LDLCALC 93 11/12/2023   TRIG 58.0 11/12/2023   CHOLHDL 3 11/12/2023    Orders: -     Lipid panel; Future -     Hepatic function panel; Future -     Basic metabolic panel with GFR; Future  Breast nodule Assessment & Plan: Palpable area 3:00 right breast. Schedule diagnostic mammogram and ultrasound for further evaluation.   Orders: -     MM 3D DIAGNOSTIC MAMMOGRAM UNILATERAL RIGHT BREAST; Future -     US  LIMITED ULTRASOUND INCLUDING AXILLA RIGHT BREAST; Future  Vitamin D  deficiency Assessment & Plan: Continue vitamin D  supplements. Check vitamin D  level with next labs.   Orders: -     VITAMIN D  25 Hydroxy (  Vit-D  Deficiency, Fractures); Future  Stress Assessment & Plan: Overall appears to be handling things relatively well. Follow.    Hyperglycemia Assessment & Plan: Low carb diet and exercise.  Follow met b and A1c.  Lab Results  Component Value Date   HGBA1C 6.1 11/12/2023    Orders: -     Hemoglobin A1c; Future  Aortic atherosclerosis (HCC) Assessment & Plan: Continue crestor .       Allena Hamilton, MD

## 2023-11-17 NOTE — Assessment & Plan Note (Addendum)
 Physical today 11/17/23.  Previously referred back to gyn for vaginal exam/pessary maintenance.   Mammogram 06/26/23 - Birads I.  Planning for colonoscopy this week.

## 2023-11-17 NOTE — Telephone Encounter (Signed)
 Called and discussed with her. Avoid heavy lifting and straining. Will let me know if any change or if develops any new symptoms.

## 2023-11-19 ENCOUNTER — Other Ambulatory Visit: Payer: Self-pay

## 2023-11-19 ENCOUNTER — Encounter
Admission: RE | Disposition: A | Discharge status: Home / Self Care | Payer: Self-pay | Source: Home / Self Care | Attending: Internal Medicine

## 2023-11-19 ENCOUNTER — Ambulatory Visit: Admitting: Anesthesiology

## 2023-11-19 ENCOUNTER — Ambulatory Visit
Admission: RE | Admit: 2023-11-19 | Discharge: 2023-11-19 | Disposition: A | Discharge status: Home / Self Care | Attending: Internal Medicine | Admitting: Internal Medicine

## 2023-11-19 ENCOUNTER — Encounter: Payer: Self-pay | Admitting: Internal Medicine

## 2023-11-19 DIAGNOSIS — G709 Myoneural disorder, unspecified: Secondary | ICD-10-CM | POA: Insufficient documentation

## 2023-11-19 DIAGNOSIS — K5730 Diverticulosis of large intestine without perforation or abscess without bleeding: Secondary | ICD-10-CM | POA: Insufficient documentation

## 2023-11-19 DIAGNOSIS — Z1211 Encounter for screening for malignant neoplasm of colon: Secondary | ICD-10-CM | POA: Insufficient documentation

## 2023-11-19 DIAGNOSIS — Z800 Family history of malignant neoplasm of digestive organs: Secondary | ICD-10-CM | POA: Insufficient documentation

## 2023-11-19 DIAGNOSIS — G4730 Sleep apnea, unspecified: Secondary | ICD-10-CM | POA: Insufficient documentation

## 2023-11-19 DIAGNOSIS — K640 First degree hemorrhoids: Secondary | ICD-10-CM | POA: Insufficient documentation

## 2023-11-19 HISTORY — PX: COLONOSCOPY: SHX5424

## 2023-11-19 SURGERY — COLONOSCOPY
Anesthesia: General

## 2023-11-19 MED ORDER — PROPOFOL 10 MG/ML IV BOLUS
INTRAVENOUS | Status: DC | PRN
  Administered 2023-11-19 (×2): 20 mg via INTRAVENOUS
  Administered 2023-11-19 (×4): 40 mg via INTRAVENOUS

## 2023-11-19 MED ORDER — DEXMEDETOMIDINE HCL IN NACL 80 MCG/20ML IV SOLN
INTRAVENOUS | Status: DC | PRN
  Administered 2023-11-19: 8 ug via INTRAVENOUS
  Administered 2023-11-19: 12 ug via INTRAVENOUS
  Administered 2023-11-19: 8 ug via INTRAVENOUS
  Administered 2023-11-19: 12 ug via INTRAVENOUS

## 2023-11-19 MED ORDER — SODIUM CHLORIDE 0.9 % IV SOLN: INTRAVENOUS | Status: DC

## 2023-11-19 MED ORDER — PROPOFOL 1000 MG/100ML IV EMUL
INTRAVENOUS | Status: AC
  Filled 2023-11-19: qty 100

## 2023-11-19 MED ORDER — PROPOFOL 500 MG/50ML IV EMUL
INTRAVENOUS | Status: DC | PRN
  Administered 2023-11-19 (×2): 75 ug/kg/min via INTRAVENOUS

## 2023-11-19 MED ORDER — LIDOCAINE HCL (CARDIAC) PF 100 MG/5ML IV SOSY
PREFILLED_SYRINGE | INTRAVENOUS | Status: DC | PRN
  Administered 2023-11-19 (×2): 50 mg via INTRAVENOUS

## 2023-11-19 MED ORDER — DEXMEDETOMIDINE HCL IN NACL 80 MCG/20ML IV SOLN
INTRAVENOUS | Status: AC
  Filled 2023-11-19: qty 20

## 2023-11-19 MED ORDER — LIDOCAINE HCL (PF) 2 % IJ SOLN
INTRAMUSCULAR | Status: AC
  Filled 2023-11-19: qty 5

## 2023-11-19 NOTE — Interval H&P Note (Signed)
 History and Physical Interval Note:  11/19/2023 10:48 AM  Nicole Hardy  has presented today for surgery, with the diagnosis of Z80.0 (ICD-10-CM) - Family history of rectal cancer.  The various methods of treatment have been discussed with the patient and family. After consideration of risks, benefits and other options for treatment, the patient has consented to  Procedure(s): COLONOSCOPY (N/A) as a surgical intervention.  The patient's history has been reviewed, patient examined, no change in status, stable for surgery.  I have reviewed the patient's chart and labs.  Questions were answered to the patient's satisfaction.     [place], Calani Gick

## 2023-11-19 NOTE — H&P (Signed)
 Outpatient short stay form Pre-procedure 11/19/2023 9:27 AM Betsey Sossamon K. Aundria, M.D.  Primary Physician: Allena Hamilton, M.D.   Reason for visit:  Family history of rectal cancer (Sister).  History of present illness:   73 year old patient presenting for family history of colon cancer. Patient denies any change in bowel habits, rectal bleeding or involuntary weight loss.     Current Facility-Administered Medications:    0.9 %  sodium chloride  infusion, , Intravenous, Continuous, [place], Indria Bishara K, MD, Last Rate: 20 mL/hr at 11/19/23 0903, New Bag at 11/19/23 0903  Medications Prior to Admission  Medication Sig Dispense Refill Last Dose/Taking   Cholecalciferol (VITAMIN D3) 50 MCG (2000 UT) TABS Take by mouth.   Past Week   rosuvastatin  (CRESTOR ) 5 MG tablet Take 1 tablet (5 mg total) by mouth daily. 90 tablet 3 Past Week     Allergies  Allergen Reactions   Metronidazole Other (See Comments)    Causes thrush in mouth     Past Medical History:  Diagnosis Date   Complication of anesthesia    Degenerative disc disease, cervical    cervical   Hypercholesterolemia    Meralgia paraesthetica    Migraines    Nephrolithiasis    Neurodermatitis    Obesity    Osteopenia    PONV (postoperative nausea and vomiting)    Vitamin D  deficiency     Review of systems:  Otherwise negative.    Physical Exam  Gen: Alert, oriented. Appears stated age.  HEENT: [place]/AT. PERRLA. Lungs: CTA, no wheezes. CV: RR nl S1, S2. Abd: soft, benign, no masses. BS+ Ext: No edema. Pulses 2+    Planned procedures: Proceed with colonoscopy. The patient understands the nature of the planned procedure, indications, risks, alternatives and potential complications including but not limited to bleeding, infection, perforation, damage to internal organs and possible oversedation/side effects from anesthesia. The patient agrees and gives consent to proceed.  Please refer to procedure notes for findings,  recommendations and patient disposition/instructions.     Sylvie Mifsud K. Aundria, M.D. Gastroenterology 11/19/2023  9:27 AM

## 2023-11-19 NOTE — Anesthesia Preprocedure Evaluation (Addendum)
 Anesthesia Evaluation  Patient identified by MRN, date of birth, ID band Patient awake    Reviewed: Allergy & Precautions, NPO status , Patient's Chart, lab work & pertinent test results  History of Anesthesia Complications (+) PONV and history of anesthetic complications  Airway Mallampati: II  TM Distance: >3 FB Neck ROM: full    Dental no notable dental hx.    Pulmonary sleep apnea    Pulmonary exam normal        Cardiovascular negative cardio ROS Normal cardiovascular exam     Neuro/Psych  Headaches  Neuromuscular disease  negative psych ROS   GI/Hepatic negative GI ROS, Neg liver ROS,,,  Endo/Other  negative endocrine ROS    Renal/GU negative Renal ROS  negative genitourinary   Musculoskeletal   Abdominal   Peds  Hematology negative hematology ROS (+)   Anesthesia Other Findings Past Medical History: No date: Complication of anesthesia No date: Degenerative disc disease, cervical     Comment:  cervical No date: Hypercholesterolemia No date: Meralgia paraesthetica No date: Migraines No date: Nephrolithiasis No date: Neurodermatitis No date: Obesity No date: Osteopenia No date: PONV (postoperative nausea and vomiting) No date: Vitamin D  deficiency  Past Surgical History: 04/24/2015: CHOLECYSTECTOMY; N/A     Comment:  Procedure: LAPAROSCOPIC CHOLECYSTECTOMY WITH               INTRAOPERATIVE CHOLANGIOGRAM;  Surgeon: Reyes LELON Cota, MD;  Location: [facility] OR[facility];  Service: General;                Laterality: N/A; 11/17/2014: EXTRACORPOREAL SHOCK WAVE LITHOTRIPSY; Right     Comment:  Procedure: EXTRACORPOREAL SHOCK WAVE LITHOTRIPSY (ESWL);              Surgeon: Ozell JONELLE Burkes, MD;  Location: [facility] OR[facility];                Service: Urology;  Laterality: Right; 12/15/2014: EXTRACORPOREAL SHOCK WAVE LITHOTRIPSY; Left     Comment:  Procedure: EXTRACORPOREAL SHOCK WAVE LITHOTRIPSY (ESWL);               Surgeon: Ozell JONELLE Burkes, MD;  Location: [facility] OR[facility];                Service: Urology;  Laterality: Left; 04/08/1988: laparoscopic surgery     Comment:  nephrolithiasis (Dr Nettie) 2010, 1980's: LITHOTRIPSY 2003, 2016: pessary 07/2023: ROOT CANAL 2016: TOTAL VAGINAL HYSTERECTOMY     Reproductive/Obstetrics negative OB ROS                              Anesthesia Physical Anesthesia Plan  ASA: 2  Anesthesia Plan: General   Post-op Pain Management: Minimal or no pain anticipated   Induction: Intravenous  PONV Risk Score and Plan: 3 and Propofol  infusion and TIVA  Airway Management Planned: Natural Airway and Nasal Cannula  Additional Equipment:   Intra-op Plan:   Post-operative Plan:   Informed Consent: I have reviewed the patients History and Physical, chart, labs and discussed the procedure including the risks, benefits and alternatives for the proposed anesthesia with the patient or authorized representative who has indicated his/her understanding and acceptance.     Dental Advisory Given  Plan Discussed with: Anesthesiologist, CRNA and Surgeon  Anesthesia Plan Comments: (Patient consented for risks of anesthesia including but not limited to:  - adverse reactions to medications - risk  of airway placement if required - damage to eyes, teeth, lips or other oral mucosa - nerve damage due to positioning  - sore throat or hoarseness - Damage to heart, brain, nerves, lungs, other parts of body or loss of life  Patient voiced understanding and assent.)         Anesthesia Quick Evaluation

## 2023-11-19 NOTE — Op Note (Signed)
 [facility] Patient Name: Nicole Hardy Procedure Date: 11/19/2023 10:51 AM MRN: [id number] Account #: [id number] Date of Birth: [birth year] Admit Type: Outpatient Age: 73 Room: [facility] ENDO ROOM 2 Gender: Female Note Status: Finalized Instrument Name: Colon Scope [phone] Procedure:             Colonoscopy Indications:           Screening in patient at increased risk: Family history                         of 1st-degree relative with colorectal cancer Providers:             Meera Vasco K. Aundria MD, MD Referring MD:          Allena Hamilton, MD (Referring MD) Medicines:             Propofol  per Anesthesia Complications:         No immediate complications. Estimated blood loss: None. Procedure:             Pre-Anesthesia Assessment:                        - The risks and benefits of the procedure and the                         sedation options and risks were discussed with the                         patient. All questions were answered and informed                         consent was obtained.                        - Patient identification and proposed procedure were                         verified prior to the procedure by the nurse. The                         procedure was verified in the procedure room.                        - ASA Grade Assessment: III - A patient with severe                         systemic disease.                        - After reviewing the risks and benefits, the patient                         was deemed in satisfactory condition to undergo the                         procedure.                        After obtaining informed consent, the colonoscope was  passed under direct vision. Throughout the procedure,                         the patient's blood pressure, pulse, and oxygen                         saturations were monitored continuously. The                         Colonoscope was introduced through  the anus and                         advanced to the the cecum, identified by appendiceal                         orifice and ileocecal valve. The colonoscopy was                         performed without difficulty. The patient tolerated                         the procedure well. The quality of the bowel                         preparation was good. The ileocecal valve, appendiceal                         orifice, and rectum were photographed. Findings:      The perianal and digital rectal examinations were normal. Pertinent       negatives include normal sphincter tone and no palpable rectal lesions.      Non-bleeding internal hemorrhoids were found during retroflexion. The       hemorrhoids were Grade I (internal hemorrhoids that do not prolapse).      Many small-mouthed diverticula were found in the sigmoid colon.      The exam was otherwise without abnormality. Impression:            - Non-bleeding internal hemorrhoids.                        - Diverticulosis in the sigmoid colon.                        - The examination was otherwise normal.                        - No specimens collected. Recommendation:        - Patient has a contact number available for                         emergencies. The signs and symptoms of potential                         delayed complications were discussed with the patient.                         Return to normal activities tomorrow. Written  discharge instructions were provided to the patient.                        - Resume previous diet.                        - Continue present medications.                        - Repeat colonoscopy in 5 years for screening purposes.                        - Return to GI office PRN.                        - The findings and recommendations were discussed with                         the patient. Procedure Code(s):     --- Professional ---                        H9894, Colorectal cancer  screening; colonoscopy on                         individual at high risk Diagnosis Code(s):     --- Professional ---                        K57.30, Diverticulosis of large intestine without                         perforation or abscess without bleeding                        K64.0, First degree hemorrhoids                        Z80.0, Family history of malignant neoplasm of                         digestive organs CPT copyright 2022 American Medical Association. All rights reserved. The codes documented in this report are preliminary and upon coder review may  be revised to meet current compliance requirements. Ladell MARLA Boss MD, MD 11/19/2023 11:22:01 AM This report has been signed electronically. Number of Addenda: 0 Note Initiated On: 11/19/2023 10:51 AM Scope Withdrawal Time: 0 hours 5 minutes 21 seconds  Total Procedure Duration: 0 hours 11 minutes 6 seconds  Estimated Blood Loss:  Estimated blood loss: none.      [facility]

## 2023-11-19 NOTE — Transfer of Care (Signed)
 Immediate Anesthesia Transfer of Care Note  Patient: Nicole Hardy  Procedure(s) Performed: COLONOSCOPY  Patient Location: PACU  Anesthesia Type:General  Level of Consciousness: sedated  Airway & Oxygen Therapy: Patient Spontanous Breathing  Post-op Assessment: Report given to RN and Post -op Vital signs reviewed and stable  Post vital signs: Reviewed and stable  Last Vitals:  Vitals Value Taken Time  BP 92/55 11/19/23 11:23  Temp    Pulse 74 11/19/23 11:23  Resp 20 11/19/23 11:23  SpO2 99 % 11/19/23 11:23  Vitals shown include unfiled device data.  Last Pain:  Vitals:   11/19/23 1123  TempSrc:   PainSc: Asleep         Complications: No notable events documented.

## 2023-11-19 NOTE — Anesthesia Postprocedure Evaluation (Signed)
 Anesthesia Post Note  Patient: Nicole Hardy  Procedure(s) Performed: COLONOSCOPY  Patient location during evaluation: Endoscopy Anesthesia Type: General Level of consciousness: awake and alert Pain management: pain level controlled Vital Signs Assessment: post-procedure vital signs reviewed and stable Respiratory status: spontaneous breathing, nonlabored ventilation, respiratory function stable and patient connected to nasal cannula oxygen Cardiovascular status: blood pressure returned to baseline and stable Postop Assessment: no apparent nausea or vomiting Anesthetic complications: no   No notable events documented.   Last Vitals:  Vitals:   11/19/23 1133 11/19/23 1143  BP: 103/63 118/80  Pulse: 75 64  Resp: 18 17  Temp:  (!) 36.2 C  SpO2: 98% 100%    Last Pain:  Vitals:   11/19/23 1143  TempSrc: Temporal  PainSc: 0-No pain                 [name]

## 2023-11-20 ENCOUNTER — Ambulatory Visit
Admission: RE | Admit: 2023-11-20 | Discharge: 2023-11-20 | Disposition: A | Discharge status: Home / Self Care | Source: Ambulatory Visit | Attending: Internal Medicine | Admitting: Internal Medicine

## 2023-11-20 DIAGNOSIS — N63 Unspecified lump in unspecified breast: Secondary | ICD-10-CM

## 2023-11-20 DIAGNOSIS — N6315 Unspecified lump in the right breast, overlapping quadrants: Secondary | ICD-10-CM | POA: Diagnosis not present

## 2023-11-21 ENCOUNTER — Other Ambulatory Visit

## 2023-11-21 ENCOUNTER — Ambulatory Visit: Payer: Self-pay | Admitting: Internal Medicine

## 2023-11-21 ENCOUNTER — Encounter

## 2023-11-21 NOTE — Telephone Encounter (Signed)
 Copied from CRM #[id number]. Topic: Clinical - Lab/Test Results >> Nov 21, 2023  1:14 PM Chasity T wrote: Reason for CRM: Patient returned call, advised notes per PCP and nurse.

## 2023-11-23 ENCOUNTER — Encounter: Payer: Self-pay | Admitting: Internal Medicine

## 2023-11-23 DIAGNOSIS — N63 Unspecified lump in unspecified breast: Secondary | ICD-10-CM | POA: Insufficient documentation

## 2023-11-23 NOTE — Assessment & Plan Note (Signed)
Continue vitamin D supplements.  Check vitamin D level with next labs.  

## 2023-11-23 NOTE — Assessment & Plan Note (Signed)
 Low carb diet and exercise.  Follow met b and A1c.  Lab Results  Component Value Date   HGBA1C 6.1 11/12/2023

## 2023-11-23 NOTE — Assessment & Plan Note (Signed)
Overall appears to be handling things relatively well.  Follow.   

## 2023-11-23 NOTE — Addendum Note (Signed)
 Addended by: GLENDIA ALLENA RAMAN on: 11/23/2023 04:08 PM   Modules accepted: Orders

## 2023-11-23 NOTE — Assessment & Plan Note (Signed)
 Continue crestor

## 2023-11-23 NOTE — Assessment & Plan Note (Signed)
 Palpable area 3:00 right breast. Schedule diagnostic mammogram and ultrasound for further evaluation.

## 2023-11-24 ENCOUNTER — Encounter

## 2023-11-24 ENCOUNTER — Other Ambulatory Visit

## 2023-11-28 ENCOUNTER — Telehealth: Payer: Self-pay | Admitting: Internal Medicine

## 2023-11-28 DIAGNOSIS — K429 Umbilical hernia without obstruction or gangrene: Secondary | ICD-10-CM

## 2023-11-28 NOTE — Telephone Encounter (Signed)
 Copied from CRM [id number]. Topic: Referral - Request for Referral >> Nov 28, 2023  9:03 AM Suzen RAMAN wrote: Did the patient discuss referral with their provider in the last year? Yes (If No - schedule appointment) (If Yes - send message)  Appointment offered? No  Type of order/referral and detailed reason for visit: [facility]-DR. SAKAI-General surgery  Preference of office, provider, location: [email or web address] [address] [place], [place], [place] [address]  If referral order, have you been seen by this specialty before? No (If Yes, this issue or another issue? When? Where?  Can we respond through MyChart? Yes   PLEASE CALL PATIENT AT [phone] (M)

## 2023-12-01 NOTE — Telephone Encounter (Signed)
 LM for patient. Please obtain information needed below from patient and notify office.

## 2023-12-01 NOTE — Progress Notes (Signed)
 Established Patient Visit   Chief Complaint: Chief Complaint  Patient presents with  . Follow-up    1 year- No Concerns   Date of Service: 12/11/2023 Date of Birth: [birth year] PCP: Glendia Allena RAMAN, MD  History of Present Illness: Ms. Daddario is a 73 y.o.female patient who presents for a 1 year follow up. PMH significant for pure hypercholesterolemia, abdominal pain, calculus of kidney, uterovaginal prolapse, and vitamin D  deficiency.   Today, pt presents with no new cardiac concerns. Patient states that she is doing well. Denies having any recent chest pain, SOB. Endorses exercising through walking. Encouraged to walk 2 miles daily. No changes made today.    Visit Summaries: 12/12/2022 Patient was seen by me for a follow up and presented without any cardiac complaints. No changes were made  Past Medical and Surgical History  Past Medical History Past Medical History:  Diagnosis Date  . Hyperlipidemia   . Kidney stones    1990's    Past Surgical History She has a past surgical history that includes Hysterectomy Vaginal (07/2014); Cataract extraction (09/2020); cholecystectomy (2016); and COLON@[facility]  (11/19/2023).   Medications and Allergies  Current Medications  Current Outpatient Medications  Medication Sig Dispense Refill  . cholecalciferol (VITAMIN D3) 2,000 unit capsule Take 2,000 Units by mouth once daily    . rosuvastatin  (CRESTOR ) 5 MG tablet Take 5 mg by mouth once daily    . sodium, potassium, and magnesium (SUPREP) oral solution Take 1 Bottle by mouth as directed One kit contains 2 bottles.  Take both bottles at the times instructed by your provider. (Patient not taking: Reported on 12/11/2023) 354 mL 0   No current facility-administered medications for this visit.    Allergies: Patient has no known allergies.  Social and Family History  Social History  reports that she has never smoked. She has never been exposed to tobacco smoke. She has never used smokeless  tobacco. She reports that she does not drink alcohol and does not use drugs.  Family History Family History  Problem Relation Name Age of Onset  . High blood pressure (Hypertension) Mother    . Arthritis Mother    . Heart murmur Mother    . High blood pressure (Hypertension) Father         Cardiac bypass  . Other Father         twisted colon surgery  . Breast cancer Sister      Review of Systems   Pertinent positives and negatives are mentioned above in HPI and all other systems are negative.  Physical Examination   Vitals:BP 132/84 (BP Location: Left upper arm, Patient Position: Sitting, BP Cuff Size: Large Adult)   Pulse 68   Resp 16   Ht 165.1 cm (5' 5)   Wt 97.3 kg (214 lb 9.6 oz)   SpO2 97%   BMI 35.71 kg/m  Ht:165.1 cm (5' 5) Wt:97.3 kg (214 lb 9.6 oz) ADJ:Anib surface area is 2.11 meters squared. Body mass index is 35.71 kg/m.  HEENT: Pupils equally reactive to light and accomodation  Neck: Supple without thyromegaly, carotid pulses 2+ Lungs: clear to auscultation bilaterally; no wheezes, rales, rhonchi Heart: Regular rate and rhythm.  No gallops, murmurs or rub Abdomen: soft nontender, nondistended, with normal bowel sounds Extremities: no cyanosis, clubbing, or edema Peripheral Pulses: 2+ in all extremities, 2+ femoral pulses bilaterally Neurologic: Alert and oriented X3; speech intact; face symmetrical; moves all extremities well  Cardiovascular Studies:    Echocardiogram 2D complete: (12/05/22) INTERPRETATION  NORMAL LEFT VENTRICULAR SYSTOLIC FUNCTION NORMAL RIGHT VENTRICULAR SYSTOLIC FUNCTION MILD VALVULAR REGURGITATION (See above) NO VALVULAR STENOSIS MILD TR, PR EF >55%  NM Myocardial Perfusion SPECT multiple (stress and rest):  Cardiac Catheterization:   Holter:  Cardiac CT Scan: 12/19/21 IMPRESSION AND RECOMMENDATION:  1. Coronary calcium  score of 1.17. This was 43rd percentile for age  and sex matched control.   2. CAC 1-99 in LAD.  CAC-DRS A1/N1.   3. Continue heart healthy lifestyle and risk factor modification.    Cardiac MRI:   Assessment   72 y.o. female with  1. Hyperlipidemia, mixed   2. Aortic atherosclerosis ()   3. OSA (obstructive sleep apnea)   4. Class 2 obesity with body mass index (BMI) of 37.0 to 37.9 in adult, unspecified obesity type, unspecified whether serious comorbidity present   5. Chest pain, unspecified type   6. Osteoarthritis, unspecified osteoarthritis type, unspecified site    Plan   Hyperlipidemia, continue rosuvastatin  therapy for lipid management  OSA, has been assessed, does not need CPAP, recommend weight loss Obesity, recommend weight loss, exercise, and portion control History of children chest pain symptoms none recently probably noncardiac recommend conservative therapy Arteriosclerosis no significant known obstructive coronary disease continue current therapy Preop clearance for hernia surgery with Dr. Tye patient appears to be an acceptable risk Have the patient follow-up in 1 year    Return in about 1 year (around 12/10/2024).  This note is partially written by Leita Ellen, in the presence of and acting as the scribe of Dr. Cara Lovelace.      Leita Ellen  I have reviewed, edited and added to the note to reflect my best personal medical judgment.  Attestation Statement:   I personally performed the service. (TP)  DWAYNE JONETTA LOVELACE, MD  [facility] [place], [place] Ph:  [phone] Fax:  [phone] This note was generated in part with voice recognition software, Dragon.  I apologize for any typographical errors that were not detected and corrected from this process.  They are unintentional.

## 2023-12-01 NOTE — Telephone Encounter (Signed)
 Sueanne, please see me about this before calling pt. Need more information. Confirm referral request.

## 2023-12-01 NOTE — Telephone Encounter (Unsigned)
 Copied from CRM #[id number]. Topic: General - Other >> Dec 01, 2023  9:50 AM Berneda FALCON wrote: Reason for CRM: Pt is returning phone call. Not positive who called her but I believe this is likely about the referral request. Please return phone call to pt at   [phone]

## 2023-12-02 NOTE — Telephone Encounter (Signed)
 Patient says she talked with you about this at her 8/11 appt but wanted to get her mammogram and colonoscopy done prior to referral. She is ready for referral to Dr Tye now for possible hernia in the naval area.

## 2023-12-02 NOTE — Telephone Encounter (Signed)
 Pt is aware.

## 2023-12-02 NOTE — Telephone Encounter (Signed)
 See previous note. I have already talked with patient.

## 2023-12-02 NOTE — Addendum Note (Signed)
 Addended by: GLENDIA ALLENA RAMAN on: 12/02/2023 12:41 PM   Modules accepted: Orders

## 2023-12-02 NOTE — Telephone Encounter (Signed)
Order placed for referral.  Someone should be contacting her with an appt date and time.

## 2023-12-02 NOTE — Telephone Encounter (Unsigned)
 Copied from CRM [id number]. Topic: Referral - Status >> Dec 02, 2023  9:04 AM Nicole Hardy wrote: Reason for CRM:  Patient called in and is  Returning call to Trish from the clinic - Referral - Cyst Consultation   Please give her a call back or leave a voicemail at earliest convenience.    Contact Information [phone]

## 2023-12-24 ENCOUNTER — Telehealth: Payer: Self-pay

## 2023-12-24 NOTE — Telephone Encounter (Signed)
 Copied from CRM [id number]. Topic: Clinical - Medical Advice >> Dec 24, 2023  3:12 PM Frederich PARAS wrote: Reason for CRM: PT wants  to speak to mrs. patricia, she received a letter from insurance and she wants to know if dr. allena will participate,she needs medical advice from tricia please give pt a call asap >> Dec 24, 2023  3:14 PM Frederich PARAS wrote: Pt will be free aroud 10am

## 2023-12-25 ENCOUNTER — Other Ambulatory Visit: Payer: Self-pay | Admitting: Surgery

## 2023-12-25 DIAGNOSIS — R1900 Intra-abdominal and pelvic swelling, mass and lump, unspecified site: Secondary | ICD-10-CM

## 2023-12-26 NOTE — Telephone Encounter (Signed)
 Patient was calling in regards to her CT scan that was ordered by Dr Tye. She had not heard anything about scheduling. Called radiology and scheduled for 9/24. Patient aware of date/time

## 2023-12-31 ENCOUNTER — Ambulatory Visit
Admission: RE | Admit: 2023-12-31 | Discharge: 2023-12-31 | Disposition: A | Discharge status: Home / Self Care | Source: Ambulatory Visit | Attending: Surgery | Admitting: Surgery

## 2023-12-31 DIAGNOSIS — R1900 Intra-abdominal and pelvic swelling, mass and lump, unspecified site: Secondary | ICD-10-CM | POA: Insufficient documentation

## 2024-01-17 ENCOUNTER — Encounter: Payer: Self-pay | Admitting: Internal Medicine

## 2024-01-17 DIAGNOSIS — G4730 Sleep apnea, unspecified: Secondary | ICD-10-CM | POA: Insufficient documentation

## 2024-03-18 ENCOUNTER — Other Ambulatory Visit

## 2024-03-18 DIAGNOSIS — R739 Hyperglycemia, unspecified: Secondary | ICD-10-CM

## 2024-03-18 DIAGNOSIS — E559 Vitamin D deficiency, unspecified: Secondary | ICD-10-CM | POA: Diagnosis not present

## 2024-03-18 DIAGNOSIS — E7800 Pure hypercholesterolemia, unspecified: Secondary | ICD-10-CM

## 2024-03-18 LAB — BASIC METABOLIC PANEL WITH GFR
BUN: 17 mg/dL (ref 6–23)
CO2: 33 meq/L — ABNORMAL HIGH (ref 19–32)
Calcium: 9.3 mg/dL (ref 8.4–10.5)
Chloride: 104 meq/L (ref 96–112)
Creatinine, Ser: 0.81 mg/dL (ref 0.40–1.20)
GFR: 72.85 mL/min (ref >60.00)
Glucose, Bld: 93 mg/dL (ref 70–99)
Potassium: 4.1 meq/L (ref 3.5–5.1)
Sodium: 141 meq/L (ref 135–145)

## 2024-03-18 LAB — LIPID PANEL
Cholesterol: 138 mg/dL (ref 0–200)
HDL: 48 mg/dL (ref >39.00)
LDL Cholesterol: 82 mg/dL (ref 0–99)
NonHDL: 90.19
Total CHOL/HDL Ratio: 3
Triglycerides: 43 mg/dL (ref 0.0–149.0)
VLDL: 8.6 mg/dL (ref 0.0–40.0)

## 2024-03-18 LAB — HEPATIC FUNCTION PANEL
ALT: 19 U/L (ref 0–35)
AST: 18 U/L (ref 0–37)
Albumin: 3.9 g/dL (ref 3.5–5.2)
Alkaline Phosphatase: 75 U/L (ref 39–117)
Bilirubin, Direct: 0.1 mg/dL (ref 0.0–0.3)
Total Bilirubin: 0.8 mg/dL (ref 0.2–1.2)
Total Protein: 7 g/dL (ref 6.0–8.3)

## 2024-03-18 LAB — HEMOGLOBIN A1C: Hgb A1c MFr Bld: 5.8 % (ref 4.6–6.5)

## 2024-03-18 LAB — VITAMIN D 25 HYDROXY (VIT D DEFICIENCY, FRACTURES): VITD: 25.48 ng/mL — ABNORMAL LOW (ref 30.00–100.00)

## 2024-03-22 ENCOUNTER — Encounter: Payer: Self-pay | Admitting: Internal Medicine

## 2024-03-22 ENCOUNTER — Ambulatory Visit: Admitting: Internal Medicine

## 2024-03-22 VITALS — BP 120/80 | HR 78 | Temp 97.9°F | Ht 64.0 in | Wt 223.0 lb

## 2024-03-22 DIAGNOSIS — R739 Hyperglycemia, unspecified: Secondary | ICD-10-CM | POA: Diagnosis not present

## 2024-03-22 DIAGNOSIS — G4730 Sleep apnea, unspecified: Secondary | ICD-10-CM | POA: Diagnosis not present

## 2024-03-22 DIAGNOSIS — N63 Unspecified lump in unspecified breast: Secondary | ICD-10-CM

## 2024-03-22 DIAGNOSIS — E7800 Pure hypercholesterolemia, unspecified: Secondary | ICD-10-CM | POA: Diagnosis not present

## 2024-03-22 DIAGNOSIS — E559 Vitamin D deficiency, unspecified: Secondary | ICD-10-CM | POA: Diagnosis not present

## 2024-03-22 DIAGNOSIS — R0981 Nasal congestion: Secondary | ICD-10-CM | POA: Diagnosis not present

## 2024-03-22 DIAGNOSIS — Z1231 Encounter for screening mammogram for malignant neoplasm of breast: Secondary | ICD-10-CM

## 2024-03-22 DIAGNOSIS — F439 Reaction to severe stress, unspecified: Secondary | ICD-10-CM | POA: Diagnosis not present

## 2024-03-22 DIAGNOSIS — R0789 Other chest pain: Secondary | ICD-10-CM | POA: Diagnosis not present

## 2024-03-22 DIAGNOSIS — R030 Elevated blood-pressure reading, without diagnosis of hypertension: Secondary | ICD-10-CM | POA: Diagnosis not present

## 2024-03-22 NOTE — Patient Instructions (Signed)
 Saline nasal spray - flush nose 1-2x/day  Nasacort nasal spray - 2 sprays each nostril one time per day.  Do this in the evening.

## 2024-03-22 NOTE — Progress Notes (Unsigned)
 Subjective:    [name], female    DOB: 1951-01-11, 73 y.o.   MRN: [id number]  Patient here for  Chief Complaint  Patient presents with   Medical Management of Chronic Issues    HPI Here for a scheduled follow up - follow up regarding hypercholesterolemia.  Evaluated for chest pain 08/2022. Saw Dr Florencio 11/07/22 - recommended echo. ECHO 12/05/22 - normal LF function, normal right ventricular systolic function, mild TR and PR with EF 55%. Had f/u with cardiology 12/2023 - stable. Saw pulmonary 01/01/24 - evaluation for sleep apnea. Saw GI 11/10/23 - recommended colonoscopy. Colonoscopy 11/19/23 - internal hemorrhoids, diverticulosis. Recommended f/u colonoscopy in 5 years. Saw Dr Tye for abdominal wall bulge. Recommended CT. CT revealed diastasis recti with a small fat containing midline ventral hernia below the umbilicus, small nonobstructive bilateral renal calculi. Saw Dr Florencio 12/11/23 - stable. Recommended to continue crestor .    Past Medical History:  Diagnosis Date   Complication of anesthesia    Degenerative disc disease, cervical    cervical   Hypercholesterolemia    Meralgia paraesthetica    Migraines    Nephrolithiasis    Neurodermatitis    Obesity    Osteopenia    PONV (postoperative nausea and vomiting)    Vitamin D  deficiency    Past Surgical History:  Procedure Laterality Date   CHOLECYSTECTOMY N/A 04/24/2015   Procedure: LAPAROSCOPIC CHOLECYSTECTOMY WITH INTRAOPERATIVE CHOLANGIOGRAM;  Surgeon: Reyes LELON Cota, MD;  Location: [facility] ORS;  Service: General;  Laterality: N/A;   COLONOSCOPY N/A 11/19/2023   Procedure: COLONOSCOPY;  Surgeon: Toledo, Ladell POUR, MD;  Location: [facility] ENDOSCOPY;  Service: Gastroenterology;  Laterality: N/A;   EXTRACORPOREAL SHOCK WAVE LITHOTRIPSY Right 11/17/2014   Procedure: EXTRACORPOREAL SHOCK WAVE LITHOTRIPSY (ESWL);  Surgeon: Ozell JONELLE Burkes, MD;  Location: [facility] ORS;  Service: Urology;  Laterality: Right;   EXTRACORPOREAL  SHOCK WAVE LITHOTRIPSY Left 12/15/2014   Procedure: EXTRACORPOREAL SHOCK WAVE LITHOTRIPSY (ESWL);  Surgeon: Ozell JONELLE Burkes, MD;  Location: [facility] ORS;  Service: Urology;  Laterality: Left;   laparoscopic surgery  04/08/1988   nephrolithiasis (Dr Nettie)   LITHOTRIPSY  2010, 1980's   pessary  2003, 2016   ROOT CANAL  07/2023   TOTAL VAGINAL HYSTERECTOMY  2016   Family History  Problem Relation Age of Onset   Hypertension Mother    Hypertension Father    Asthma Sister    Hypertension Sister    Diabetes Sister    Breast cancer Sister 2   Rectal cancer Sister    Social History   Socioeconomic History   Marital status: Widowed    Spouse name: Not on file   Number of children: 1   Years of education: Not on file   Highest education level: Not on file  Occupational History   Not on file  Tobacco Use   Smoking status: Never   Smokeless tobacco: Never  Vaping Use   Vaping status: Never Used  Substance and Sexual Activity   Alcohol use: No    Alcohol/week: 0.0 standard drinks of alcohol   Drug use: No   Sexual activity: Not Currently    Birth control/protection: Surgical  Other Topics Concern   Not on file  Social History Narrative   Not on file   Social Drivers of Health   Tobacco Use: Low Risk (03/22/2024)   Patient History    Smoking Tobacco Use: Never    Smokeless Tobacco Use: Never    Passive Exposure: Not  on file  Financial Resource Strain: Low Risk  (12/10/2023)   Received from [facility]   Overall Financial Resource Strain (CARDIA)    Difficulty of Paying Living Expenses: Not very hard  Food Insecurity: No Food Insecurity (12/10/2023)   Received from [facility]   Epic    Within the past 12 months, you worried that your food would run out before you got the money to buy more.: Never true    Within the past 12 months, the food you bought just didn't last and you didn't have money to get more.: Never true  Transportation Needs:  No Transportation Needs (12/10/2023)   Received from [facility] - Transportation    In the past 12 months, has lack of transportation kept you from medical appointments or from getting medications?: No    Lack of Transportation (Non-Medical): No  Physical Activity: Sufficiently Active (10/22/2023)   Exercise Vital Sign    Days of Exercise per Week: 6 days    Minutes of Exercise per Session: 60 min  Stress: No Stress Concern Present (10/22/2023)   [employer] of Occupational Health - Occupational Stress Questionnaire    Feeling of Stress: Not at all  Social Connections: Moderately Integrated (10/22/2023)   Social Connection and Isolation Panel    Frequency of Communication with Friends and Family: More than three times a week    Frequency of Social Gatherings with Friends and Family: More than three times a week    Attends Religious Services: More than 4 times per year    Active Member of [employer] or Organizations: Yes    Attends Banker Meetings: More than 4 times per year    Marital Status: Widowed  Depression (PHQ2-9): Low Risk (03/22/2024)   Depression (PHQ2-9)    PHQ-2 Score: 1  Alcohol Screen: Low Risk (10/22/2023)   Alcohol Screen    Last Alcohol Screening Score (AUDIT): 0  Housing: Low Risk  (12/10/2023)   [facility] from [facility]   Epic    In the last 12 months, was there a time when you were not able to pay the mortgage or rent on time?: No    In the past 12 months, how many times have you moved where you were living?: 0    At any time in the past 12 months, were you homeless or living in a shelter (including now)?: No  Utilities: Not At Risk (12/10/2023)   Received from [facility]   Epic    In the past 12 months has the electric, gas, oil, or water company threatened to shut off services in your home?: No  Health Literacy: Adequate Health Literacy (10/22/2023)   B1300 Health Literacy    Frequency  of need for help with medical instructions: Never     Review of Systems     Objective:     BP 120/80   Pulse 78   Temp 97.9 F (36.6 C) (Oral)   Ht 5' 4 (1.626 m)   Wt 223 lb (101.2 kg)   LMP 09/10/2016   SpO2 97%   BMI 38.28 kg/m  Wt Readings from Last 3 Encounters:  03/22/24 223 lb (101.2 kg)  11/19/23 208 lb (94.3 kg)  11/17/23 210 lb 3.2 oz (95.3 kg)    Physical Exam  {Perform Simple Foot Exam  Perform Detailed exam:1} {Insert foot Exam (Optional):30965}   Outpatient Encounter Medications as of 03/22/2024  Medication  Sig   cetirizine (ZYRTEC) 5 MG chewable tablet Chew 5 mg by mouth daily.   Cholecalciferol (VITAMIN D3) 50 MCG (2000 UT) TABS Take by mouth.   rosuvastatin  (CRESTOR ) 5 MG tablet Take 1 tablet (5 mg total) by mouth daily.   No facility-administered encounter medications on file as of 03/22/2024.     Lab Results  Component Value Date   WBC 4.4 11/12/2023   HGB 13.7 11/12/2023   HCT 42.3 11/12/2023   PLT 271.0 11/12/2023   GLUCOSE 93 03/18/2024   CHOL 138 03/18/2024   TRIG 43.0 03/18/2024   HDL 48.00 03/18/2024   LDLCALC 82 03/18/2024   ALT 19 03/18/2024   AST 18 03/18/2024   NA 141 03/18/2024   K 4.1 03/18/2024   CL 104 03/18/2024   CREATININE 0.81 03/18/2024   BUN 17 03/18/2024   CO2 33 (H) 03/18/2024   TSH 1.55 11/12/2023   HGBA1C 5.8 03/18/2024    CT ABDOMEN PELVIS WO CONTRAST Result Date: 01/06/2024 CLINICAL DATA:  Abdominal bulge EXAM: CT ABDOMEN AND PELVIS WITHOUT CONTRAST TECHNIQUE: Multidetector CT imaging of the abdomen and pelvis was performed following the standard protocol without IV contrast. RADIATION DOSE REDUCTION: This exam was performed according to the departmental dose-optimization program which includes automated exposure control, adjustment of the mA and/or kV according to patient size and/or use of iterative reconstruction technique. COMPARISON:  None Available. FINDINGS: Lower chest: No acute abnormality.  Small  hiatal hernia. Hepatobiliary: No focal liver abnormality is seen. Status post cholecystectomy. Postoperative biliary dilatation. Pancreas: Unremarkable. No pancreatic ductal dilatation or surrounding inflammatory changes. Spleen: Normal in size without significant abnormality. Adrenals/Urinary Tract: Adrenal glands are unremarkable. Small nonobstructive bilateral renal calculi. Bladder is unremarkable. Stomach/Bowel: Stomach is within normal limits. Appendix appears normal. No evidence of bowel wall thickening, distention, or inflammatory changes. Vascular/Lymphatic: Aortic atherosclerosis. No enlarged abdominal or pelvic lymph nodes. Reproductive: Hysterectomy. Other: Diastasis recti with a small, fat containing low midline ventral hernia below the umbilicus, hernia neck measuring 2.0 cm, sac depth 4.6 cm (series 6, image 91, series 2, image 64). No ascites. Musculoskeletal: No acute or significant osseous findings. IMPRESSION: 1. Diastasis recti with a small, fat containing low midline ventral hernia below the umbilicus, hernia neck measuring 2.0 cm, sac depth 4.6 cm. 2. Small nonobstructive bilateral renal calculi. 3. Status post cholecystectomy and hysterectomy. Aortic Atherosclerosis (ICD10-I70.0). Electronically Signed   By: Marolyn JONETTA Jaksch M.D.   On: 01/06/2024 07:31       Assessment & Plan:  Hypercholesterolemia  Chest discomfort  Breast nodule  Encounter for screening mammogram for malignant neoplasm of breast     Allena Hamilton, MD

## 2024-03-28 ENCOUNTER — Encounter: Payer: Self-pay | Admitting: Internal Medicine

## 2024-03-28 NOTE — Assessment & Plan Note (Signed)
 On crestor .  Tolerating.  Cholesterol improved.  Low cholesterol diet and exercise.  Follow lipid panel and liver function tests.   Lab Results  Component Value Date   CHOL 138 03/18/2024   HDL 48.00 03/18/2024   LDLCALC 82 03/18/2024   TRIG 43.0 03/18/2024   CHOLHDL 3 03/18/2024

## 2024-03-28 NOTE — Assessment & Plan Note (Signed)
 Sleep referral provided  Orders:    ECG 12 lead (Normal)    Ambulatory referral to Sleep Medicine ([facility]); Future

## 2024-03-28 NOTE — Assessment & Plan Note (Signed)
 Vitamin D  level just checked - 25. Discussed vitamin D  supplements.

## 2024-03-28 NOTE — Assessment & Plan Note (Signed)
 Noted on outside check as outlined. Improved today. Follow pressures. Hold on additional medication. Follow.

## 2024-03-28 NOTE — Assessment & Plan Note (Signed)
Overall appears to be handling things relatively well.  Follow.   

## 2024-03-28 NOTE — Assessment & Plan Note (Signed)
 Symptom as outlined. Have improved. Has been on amoxicillin  for her gum infection. Saline nasal spray and steroid nasal spray. Follow. Call with update.

## 2024-03-28 NOTE — Assessment & Plan Note (Signed)
 Low carb diet and exercise.  Follow met b and A1c.  Lab Results  Component Value Date   HGBA1C 5.8 03/18/2024

## 2024-06-23 ENCOUNTER — Other Ambulatory Visit

## 2024-06-28 ENCOUNTER — Ambulatory Visit: Admitting: Internal Medicine

## 2024-06-30 ENCOUNTER — Encounter

## 2024-10-27 ENCOUNTER — Ambulatory Visit
